# Patient Record
Sex: Female | Born: 1950 | Race: Black or African American | Hispanic: No | Smoking: Never smoker
Health system: Southern US, Community
[De-identification: ages and names within clinical notes are randomized; demographics above are authoritative.]

## PROBLEM LIST (undated history)

## (undated) DIAGNOSIS — T7840XA Allergy, unspecified, initial encounter: Secondary | ICD-10-CM

## (undated) DIAGNOSIS — M1990 Unspecified osteoarthritis, unspecified site: Secondary | ICD-10-CM

## (undated) DIAGNOSIS — C801 Malignant (primary) neoplasm, unspecified: Secondary | ICD-10-CM

## (undated) DIAGNOSIS — H269 Unspecified cataract: Secondary | ICD-10-CM

## (undated) DIAGNOSIS — M8580 Other specified disorders of bone density and structure, unspecified site: Secondary | ICD-10-CM

## (undated) DIAGNOSIS — I10 Essential (primary) hypertension: Secondary | ICD-10-CM

## (undated) DIAGNOSIS — E785 Hyperlipidemia, unspecified: Secondary | ICD-10-CM

## (undated) DIAGNOSIS — K219 Gastro-esophageal reflux disease without esophagitis: Secondary | ICD-10-CM

## (undated) DIAGNOSIS — K3184 Gastroparesis: Secondary | ICD-10-CM

## (undated) HISTORY — DX: Unspecified cataract: H26.9

## (undated) HISTORY — DX: Malignant (primary) neoplasm, unspecified: C80.1

## (undated) HISTORY — PX: ABDOMINAL HYSTERECTOMY: SHX81

## (undated) HISTORY — DX: Gastro-esophageal reflux disease without esophagitis: K21.9

## (undated) HISTORY — DX: Gastroparesis: K31.84

## (undated) HISTORY — DX: Other specified disorders of bone density and structure, unspecified site: M85.80

## (undated) HISTORY — PX: EYE SURGERY: SHX253

## (undated) HISTORY — DX: Allergy, unspecified, initial encounter: T78.40XA

## (undated) HISTORY — DX: Essential (primary) hypertension: I10

## (undated) HISTORY — PX: CATARACT EXTRACTION: SUR2

## (undated) HISTORY — DX: Hyperlipidemia, unspecified: E78.5

## (undated) HISTORY — PX: CHOLECYSTECTOMY: SHX55

## (undated) HISTORY — DX: Unspecified osteoarthritis, unspecified site: M19.90

---

## 2000-01-29 ENCOUNTER — Encounter: Admission: RE | Admit: 2000-01-29 | Discharge: 2000-01-29 | Payer: Self-pay | Admitting: Internal Medicine

## 2000-01-29 ENCOUNTER — Encounter: Payer: Self-pay | Admitting: Internal Medicine

## 2000-04-25 ENCOUNTER — Other Ambulatory Visit: Admission: RE | Admit: 2000-04-25 | Discharge: 2000-04-25 | Payer: Self-pay | Admitting: Internal Medicine

## 2000-10-24 ENCOUNTER — Encounter: Admission: RE | Admit: 2000-10-24 | Discharge: 2000-10-24 | Payer: Self-pay | Admitting: Internal Medicine

## 2000-10-24 ENCOUNTER — Encounter: Payer: Self-pay | Admitting: Internal Medicine

## 2001-12-09 ENCOUNTER — Encounter: Admission: RE | Admit: 2001-12-09 | Discharge: 2001-12-09 | Payer: Self-pay | Admitting: Internal Medicine

## 2001-12-09 ENCOUNTER — Encounter: Payer: Self-pay | Admitting: Internal Medicine

## 2003-04-28 ENCOUNTER — Encounter: Payer: Self-pay | Admitting: Internal Medicine

## 2003-04-28 ENCOUNTER — Encounter: Admission: RE | Admit: 2003-04-28 | Discharge: 2003-04-28 | Payer: Self-pay | Admitting: Internal Medicine

## 2003-07-01 ENCOUNTER — Encounter: Admission: RE | Admit: 2003-07-01 | Discharge: 2003-07-01 | Payer: Self-pay | Admitting: Internal Medicine

## 2003-07-01 ENCOUNTER — Encounter: Payer: Self-pay | Admitting: Internal Medicine

## 2003-07-11 ENCOUNTER — Encounter: Payer: Self-pay | Admitting: Internal Medicine

## 2003-07-11 ENCOUNTER — Inpatient Hospital Stay (HOSPITAL_COMMUNITY): Admission: EM | Admit: 2003-07-11 | Discharge: 2003-07-12 | Payer: Self-pay | Admitting: Emergency Medicine

## 2003-07-13 ENCOUNTER — Ambulatory Visit (HOSPITAL_COMMUNITY): Admission: RE | Admit: 2003-07-13 | Discharge: 2003-07-13 | Payer: Self-pay | Admitting: Cardiology

## 2003-07-13 ENCOUNTER — Encounter: Payer: Self-pay | Admitting: Cardiology

## 2003-07-21 ENCOUNTER — Ambulatory Visit (HOSPITAL_COMMUNITY): Admission: RE | Admit: 2003-07-21 | Discharge: 2003-07-22 | Payer: Self-pay | Admitting: General Surgery

## 2003-07-21 ENCOUNTER — Encounter (INDEPENDENT_AMBULATORY_CARE_PROVIDER_SITE_OTHER): Payer: Self-pay | Admitting: *Deleted

## 2003-07-21 ENCOUNTER — Encounter (HOSPITAL_BASED_OUTPATIENT_CLINIC_OR_DEPARTMENT_OTHER): Payer: Self-pay | Admitting: General Surgery

## 2004-06-26 ENCOUNTER — Encounter: Admission: RE | Admit: 2004-06-26 | Discharge: 2004-06-26 | Payer: Self-pay | Admitting: Internal Medicine

## 2007-06-26 ENCOUNTER — Encounter: Admission: RE | Admit: 2007-06-26 | Discharge: 2007-06-26 | Payer: Self-pay | Admitting: Family Medicine

## 2008-07-25 ENCOUNTER — Encounter: Admission: RE | Admit: 2008-07-25 | Discharge: 2008-07-25 | Payer: Self-pay | Admitting: Family Medicine

## 2009-09-04 ENCOUNTER — Ambulatory Visit (HOSPITAL_BASED_OUTPATIENT_CLINIC_OR_DEPARTMENT_OTHER): Admission: RE | Admit: 2009-09-04 | Discharge: 2009-09-05 | Payer: Self-pay | Admitting: Urology

## 2010-02-16 ENCOUNTER — Encounter: Admission: RE | Admit: 2010-02-16 | Discharge: 2010-02-16 | Payer: Self-pay | Admitting: Family Medicine

## 2010-11-11 ENCOUNTER — Encounter: Payer: Self-pay | Admitting: Family Medicine

## 2011-01-23 LAB — CREATININE, SERUM
Creatinine, Ser: 1.05 mg/dL (ref 0.4–1.2)
GFR calc Af Amer: >60 mL/min (ref >60)
GFR calc non Af Amer: 54 mL/min — ABNORMAL LOW (ref >60)

## 2011-01-23 LAB — POCT I-STAT 4, (NA,K, GLUC, HGB,HCT)
Potassium: 3.4 mEq/L — ABNORMAL LOW (ref 3.5–5.1)
Sodium: 140 mEq/L (ref 135–145)

## 2011-03-08 NOTE — Op Note (Signed)
[name], [name]                      ACCOUNT NO.:  [id number]   MEDICAL RECORD NO.:  [id number]                   PATIENT TYPE:  OIB   LOCATION:  2899                                 FACILITY:  [facility]   PHYSICIAN:  Leonie Man, M.D.                DATE OF BIRTH:  06/14/1951   DATE OF PROCEDURE:  07/21/2003  DATE OF DISCHARGE:                                 OPERATIVE REPORT   PREOPERATIVE DIAGNOSIS:  Chronic calculous cholecystitis.   POSTOPERATIVE DIAGNOSIS:  Chronic calculous cholecystitis.   PROCEDURE:  Laparoscopic cholecystectomy with intraoperative cholangiogram.   SURGEON:  Leonie Man, M.D.   ASSISTANT:  Joanne Gavel, M.D.   ANESTHESIA:  General endotracheal.   NOTE:  The patient is a 60 year old female presenting with epigastric and  right upper quadrant abdominal pain.  She had associated nausea and vomiting  on evaluation at the [facility] ER.  She had a gallbladder ultrasound which  shows a thick-walled gallbladder with stones.  There was a mild elevation in  her LFTs and she had a hyperamylasemia up to 205, which on repeat had come  down to 182.  She had no associated leukocytosis or fever.  She comes to the  operating room now for a laparoscopic cholecystectomy after the risks and  potential benefits of surgery have been fully discussed, all questions  answered, and consent obtained.   DESCRIPTION OF PROCEDURE:  Following the induction of satisfactory general  anesthesia, the patient was positioned supinely, the abdomen routinely  prepped and draped to be included in a sterile field.  Open laparoscopy was  carried down on the inferior border of the umbilicus with insertion of a  Hasson cannula and insufflation of the peritoneal cavity to 14 mmHg pressure  using carbon dioxide.  The camera was inserted and visual exploration of the  abdomen carried out.  Anterior gastric wall and duodenal sweep appeared to  be normal.  The gallbladder appeared to be  somewhat thickened but otherwise  not hydropic.  The liver edges were sharp, liver surfaces smooth.  None of  the small or large intestine visualized appeared to be normal.  The pelvic  organs were not visualized and thought to be surgically absent.  There were  some tethering adhesions to the anterior abdominal wall, but these were not  taken down.  Under direct vision epigastric and lateral ports were placed.  The gallbladder was grasped and retracted cephalad and the dissection was  carried down to the region of the ampulla with isolation of the cystic  artery and cystic duct, tracing the cystic artery up to its entry into the  gallbladder wall and tracing the cystic duct down to the common bile duct  and up to the cystic duct-gallbladder junction.  The cystic artery was  doubly clipped and transected.  The cystic duct was clipped proximally and  opened.  I inserted a cholangiocatheter into the cystic duct  through the  abdominal wall and injected one-half strength Hypaque dye into the biliary  system under fluoroscopic guidance.  The first cholangiogram showed what  appeared to be either a bubble or a stone within the common bile duct.  However, there was prompt filling of the upper radicles and the normal  tapering off the distal common bile duct with free flow of contrast into the  duodenum.  On flushing of the common bile duct, the filling defect then  disappeared.  This has been felt to be a bubble; however, on radiologic  evaluation a stone could not be ruled out.  The cholangiocatheter was then  removed.  The cystic duct was doubly clipped and then transected.  The  gallbladder was dissected from the liver bed using electrocautery and  maintaining hemostasis throughout the entire course of the dissection with  electrocautery.  After the gallbladder was removed, the liver bed was  thoroughly checked for hemostasis and noted to be dry.  The gallbladder was  then placed in a pouch.   With the camera in the epigastric port, the  gallbladder was retrieved through the umbilical port without difficulty.  The right upper quadrant was thoroughly irrigated with normal saline and  aspirated.  Sponge, instrument, and sharp counts were verified.  Trocars  removed under direct vision and pneumoperitoneum allowed to deflate and the  wounds closed in layers as follows:  The umbilical wound in two layers with  0 Vicryl and 4-0 Monocryl; epigastric and lateral port wounds were closed  with 4-0 Monocryl sutures.  All wounds were reinforced with Steri-Strips and  sterile dressings applied.  The anesthetic reversed, the patient removed  from the operating room to the recovery room in stable condition.  She  tolerated the procedure well.                                               Leonie Man, M.D.    PB/MEDQ  D:  07/21/2003  T:  07/21/2003  Job:  [id number]

## 2011-03-08 NOTE — Discharge Summary (Signed)
[name], STRENG                      ACCOUNT NO.:  [id number]   MEDICAL RECORD NO.:  [id number]                   PATIENT TYPE:  INP   LOCATION:  0346                                 FACILITY:  [facility]   PHYSICIAN:  Sherin Quarry, MD                   DATE OF BIRTH:  Sep 12, 1951   DATE OF ADMISSION:  07/11/2003  DATE OF DISCHARGE:                                 DISCHARGE SUMMARY   HISTORY OF PRESENT ILLNESS:  [name] is a very pleasant 60 year old  lady with a history of hypertension who presented on September 20 with  substernal chest pressure which seemed to radiate through to the back.  This  was associated with nausea.  The patient reported that she was somewhat  diaphoretic at time of admission.  She did not recall any previous similar  episodes.   PHYSICAL EXAMINATION:  HEENT:  Within normal limits.  CHEST:  Clear.  CARDIOVASCULAR:  Normal S1 and S2 without rubs, murmurs, or gallops.  ABDOMEN:  Benign.  There was mild epigastric tenderness.  There was no  guarding or rebound.  NEUROLOGIC:  Normal.  EXTREMITIES:  Normal.   LABORATORIES:  Amylase 205, lipase 36.  Serial cardiac enzymes were  negative.  CMET was remarkable for an AST of 73, ALT 29.  White count 8300,  hemoglobin 12.8.  LDL 113, HDL 84.  The patient had a chest x-ray which was  within normal limits.  The abdominal ultrasound showed evidence of  cholelithiasis.   HOSPITAL COURSE:  Consultation was obtained from [facility] Sharyn Lull, M.D. of  the cardiology service who felt that the patient's chest pain was somewhat  suggestive of a cardiac process.  He recommended the patient undergo a  stress Cardiolite study.  Unfortunately, this could not be scheduled on  September 21 and was scheduled for September 22.  As the patient felt well,  it was her wish that she have this done as an outpatient.  Therefore, on  September 21 the patient was discharged.   DISCHARGE DIAGNOSES:  1. Chest pain probably  secondary to cholecystitis.  2. Hypertension.   DISCHARGE MEDICATIONS:  1. The patient was advised to take Protonix 40 mg daily.  2. She was also advised to take aspirin 325 mg daily.    DISCHARGE INSTRUCTIONS:  She was advised to return for stress Cardiolite  study to be done in the morning and to be n.p.o. after midnight.  She was  further advised to make an appointment with [facility] to  further evaluate her cholelithiasis.   CONDITION ON DISCHARGE:  Good.                                               Sherin Quarry, MD    SY/MEDQ  D:  07/12/2003  T:  07/12/2003  Job:  [id number]   cc:   Olene Craven, M.D.  [address]  [address]  [place]  [place] [address]  Fax: [phone]   Eduardo Osier. Sharyn Lull, M.D.  [address]  [place]  [place] [address]  Fax: [phone]

## 2011-06-25 ENCOUNTER — Other Ambulatory Visit: Payer: Self-pay | Admitting: Family Medicine

## 2011-06-25 DIAGNOSIS — N951 Menopausal and female climacteric states: Secondary | ICD-10-CM

## 2011-06-27 ENCOUNTER — Other Ambulatory Visit: Payer: Self-pay | Admitting: Family Medicine

## 2011-06-27 DIAGNOSIS — Z1231 Encounter for screening mammogram for malignant neoplasm of breast: Secondary | ICD-10-CM

## 2011-07-29 ENCOUNTER — Ambulatory Visit
Admission: RE | Admit: 2011-07-29 | Discharge: 2011-07-29 | Disposition: A | Discharge status: Home / Self Care | Payer: Commercial Managed Care - PPO | Source: Ambulatory Visit | Attending: Family Medicine | Admitting: Family Medicine

## 2011-07-29 DIAGNOSIS — Z1231 Encounter for screening mammogram for malignant neoplasm of breast: Secondary | ICD-10-CM

## 2011-07-29 DIAGNOSIS — N951 Menopausal and female climacteric states: Secondary | ICD-10-CM

## 2011-07-30 ENCOUNTER — Ambulatory Visit: Payer: Self-pay

## 2011-07-30 ENCOUNTER — Other Ambulatory Visit: Payer: Self-pay

## 2011-08-05 ENCOUNTER — Other Ambulatory Visit: Payer: Self-pay | Admitting: Emergency Medicine

## 2011-08-05 LAB — DIFFERENTIAL
Basophils Relative: 0 % (ref 0–1)
Eosinophils Absolute: 0.2 10*3/uL (ref 0.0–0.7)
Monocytes Relative: 6 % (ref 3–12)
Neutrophils Relative %: 35 % — ABNORMAL LOW (ref 43–77)

## 2011-08-05 LAB — COMPREHENSIVE METABOLIC PANEL
CO2: 27 mEq/L (ref 19–32)
Calcium: 10.8 mg/dL — ABNORMAL HIGH (ref 8.4–10.5)
Creatinine, Ser: 1.1 mg/dL (ref 0.50–1.10)
GFR calc Af Amer: 61 mL/min — ABNORMAL LOW (ref >90)
GFR calc non Af Amer: 53 mL/min — ABNORMAL LOW (ref >90)
Glucose, Bld: 91 mg/dL (ref 70–99)

## 2011-08-05 LAB — CBC
Hemoglobin: 14 g/dL (ref 12.0–15.0)
RBC: 4.27 MIL/uL (ref 3.87–5.11)
WBC: 4.9 10*3/uL (ref 4.0–10.5)

## 2012-07-27 ENCOUNTER — Other Ambulatory Visit (HOSPITAL_BASED_OUTPATIENT_CLINIC_OR_DEPARTMENT_OTHER): Payer: Self-pay | Admitting: Family Medicine

## 2012-07-27 DIAGNOSIS — Z1231 Encounter for screening mammogram for malignant neoplasm of breast: Secondary | ICD-10-CM

## 2012-08-03 ENCOUNTER — Ambulatory Visit (HOSPITAL_BASED_OUTPATIENT_CLINIC_OR_DEPARTMENT_OTHER)
Admission: RE | Admit: 2012-08-03 | Discharge: 2012-08-03 | Disposition: A | Discharge status: Home / Self Care | Payer: Commercial Managed Care - PPO | Source: Ambulatory Visit | Attending: Family Medicine | Admitting: Family Medicine

## 2012-08-03 DIAGNOSIS — Z1231 Encounter for screening mammogram for malignant neoplasm of breast: Secondary | ICD-10-CM

## 2013-02-08 ENCOUNTER — Encounter: Payer: Self-pay | Admitting: *Deleted

## 2013-02-10 ENCOUNTER — Ambulatory Visit: Payer: Commercial Managed Care - PPO | Admitting: Internal Medicine

## 2013-02-15 ENCOUNTER — Ambulatory Visit: Payer: Commercial Managed Care - PPO | Admitting: Internal Medicine

## 2013-07-06 ENCOUNTER — Other Ambulatory Visit (HOSPITAL_BASED_OUTPATIENT_CLINIC_OR_DEPARTMENT_OTHER): Payer: Self-pay

## 2013-07-06 DIAGNOSIS — Z1231 Encounter for screening mammogram for malignant neoplasm of breast: Secondary | ICD-10-CM

## 2013-08-09 ENCOUNTER — Ambulatory Visit (HOSPITAL_BASED_OUTPATIENT_CLINIC_OR_DEPARTMENT_OTHER): Payer: Commercial Managed Care - PPO

## 2013-08-16 ENCOUNTER — Ambulatory Visit (HOSPITAL_BASED_OUTPATIENT_CLINIC_OR_DEPARTMENT_OTHER): Payer: Commercial Managed Care - PPO

## 2014-01-27 ENCOUNTER — Other Ambulatory Visit (HOSPITAL_BASED_OUTPATIENT_CLINIC_OR_DEPARTMENT_OTHER): Payer: Self-pay | Admitting: Family Medicine

## 2014-01-27 DIAGNOSIS — R319 Hematuria, unspecified: Secondary | ICD-10-CM

## 2014-01-27 DIAGNOSIS — R109 Unspecified abdominal pain: Secondary | ICD-10-CM

## 2014-01-28 ENCOUNTER — Ambulatory Visit (HOSPITAL_BASED_OUTPATIENT_CLINIC_OR_DEPARTMENT_OTHER)
Admission: RE | Admit: 2014-01-28 | Discharge: 2014-01-28 | Disposition: A | Discharge status: Home / Self Care | Payer: Commercial Managed Care - PPO | Source: Ambulatory Visit

## 2014-01-28 ENCOUNTER — Ambulatory Visit (HOSPITAL_BASED_OUTPATIENT_CLINIC_OR_DEPARTMENT_OTHER)
Admission: RE | Admit: 2014-01-28 | Discharge: 2014-01-28 | Disposition: A | Discharge status: Home / Self Care | Payer: Commercial Managed Care - PPO | Source: Ambulatory Visit | Attending: Family Medicine | Admitting: Family Medicine

## 2014-01-28 DIAGNOSIS — R319 Hematuria, unspecified: Secondary | ICD-10-CM

## 2014-01-28 DIAGNOSIS — Z1231 Encounter for screening mammogram for malignant neoplasm of breast: Secondary | ICD-10-CM

## 2014-01-28 DIAGNOSIS — R109 Unspecified abdominal pain: Secondary | ICD-10-CM

## 2015-11-23 ENCOUNTER — Ambulatory Visit
Admission: RE | Admit: 2015-11-23 | Discharge: 2015-11-23 | Disposition: A | Discharge status: Home / Self Care | Payer: Medicare Other | Source: Ambulatory Visit | Attending: Family Medicine | Admitting: Family Medicine

## 2015-11-23 ENCOUNTER — Other Ambulatory Visit: Payer: Self-pay | Admitting: Family Medicine

## 2015-11-23 DIAGNOSIS — M542 Cervicalgia: Secondary | ICD-10-CM

## 2015-11-24 ENCOUNTER — Other Ambulatory Visit: Payer: Self-pay | Admitting: Family Medicine

## 2015-11-29 ENCOUNTER — Other Ambulatory Visit: Payer: Self-pay | Admitting: Family Medicine

## 2015-11-29 DIAGNOSIS — R112 Nausea with vomiting, unspecified: Secondary | ICD-10-CM

## 2015-11-29 DIAGNOSIS — M436 Torticollis: Secondary | ICD-10-CM

## 2015-11-29 DIAGNOSIS — M542 Cervicalgia: Secondary | ICD-10-CM

## 2015-12-04 ENCOUNTER — Ambulatory Visit
Admission: RE | Admit: 2015-12-04 | Discharge: 2015-12-04 | Disposition: A | Discharge status: Home / Self Care | Payer: Medicare Other | Source: Ambulatory Visit | Attending: Family Medicine | Admitting: Family Medicine

## 2015-12-04 DIAGNOSIS — M436 Torticollis: Secondary | ICD-10-CM

## 2015-12-04 DIAGNOSIS — M542 Cervicalgia: Secondary | ICD-10-CM

## 2015-12-04 DIAGNOSIS — R112 Nausea with vomiting, unspecified: Secondary | ICD-10-CM

## 2016-05-01 ENCOUNTER — Other Ambulatory Visit (HOSPITAL_BASED_OUTPATIENT_CLINIC_OR_DEPARTMENT_OTHER): Payer: Self-pay | Admitting: Family Medicine

## 2016-05-01 DIAGNOSIS — Z1231 Encounter for screening mammogram for malignant neoplasm of breast: Secondary | ICD-10-CM

## 2016-05-07 ENCOUNTER — Ambulatory Visit (HOSPITAL_BASED_OUTPATIENT_CLINIC_OR_DEPARTMENT_OTHER)
Admission: RE | Admit: 2016-05-07 | Discharge: 2016-05-07 | Disposition: A | Discharge status: Home / Self Care | Payer: Medicare Other | Source: Ambulatory Visit | Attending: Family Medicine | Admitting: Family Medicine

## 2016-05-07 DIAGNOSIS — Z1231 Encounter for screening mammogram for malignant neoplasm of breast: Secondary | ICD-10-CM

## 2016-08-07 ENCOUNTER — Other Ambulatory Visit: Payer: Self-pay | Admitting: Family Medicine

## 2016-08-07 DIAGNOSIS — R5381 Other malaise: Secondary | ICD-10-CM

## 2016-08-08 ENCOUNTER — Other Ambulatory Visit: Payer: Self-pay | Admitting: Family Medicine

## 2016-08-08 DIAGNOSIS — M8580 Other specified disorders of bone density and structure, unspecified site: Secondary | ICD-10-CM

## 2016-08-21 ENCOUNTER — Other Ambulatory Visit (HOSPITAL_BASED_OUTPATIENT_CLINIC_OR_DEPARTMENT_OTHER): Payer: Self-pay | Admitting: Family Medicine

## 2016-08-21 ENCOUNTER — Ambulatory Visit (HOSPITAL_BASED_OUTPATIENT_CLINIC_OR_DEPARTMENT_OTHER)
Admission: RE | Admit: 2016-08-21 | Discharge: 2016-08-21 | Disposition: A | Discharge status: Home / Self Care | Payer: Medicare Other | Source: Ambulatory Visit | Attending: Family Medicine | Admitting: Family Medicine

## 2016-08-21 DIAGNOSIS — N951 Menopausal and female climacteric states: Secondary | ICD-10-CM | POA: Diagnosis present

## 2016-08-21 DIAGNOSIS — M8580 Other specified disorders of bone density and structure, unspecified site: Secondary | ICD-10-CM

## 2016-08-21 DIAGNOSIS — M8588 Other specified disorders of bone density and structure, other site: Secondary | ICD-10-CM | POA: Insufficient documentation

## 2016-08-23 ENCOUNTER — Other Ambulatory Visit: Payer: Medicare Other

## 2017-02-06 ENCOUNTER — Telehealth: Payer: Self-pay | Admitting: Cardiology

## 2017-02-06 NOTE — Telephone Encounter (Signed)
Received records from [facility] for appointment on 02/07/17 with Dr Ellyn Hack.  Records put with Dr Allison Quarry schedule for 02/07/17.lp

## 2017-02-07 ENCOUNTER — Ambulatory Visit (INDEPENDENT_AMBULATORY_CARE_PROVIDER_SITE_OTHER): Payer: Medicare Other | Admitting: Cardiology

## 2017-02-07 ENCOUNTER — Encounter: Payer: Self-pay | Admitting: Cardiology

## 2017-02-07 VITALS — BP 124/74 | HR 73 | Ht 61.0 in | Wt 148.8 lb

## 2017-02-07 DIAGNOSIS — E785 Hyperlipidemia, unspecified: Secondary | ICD-10-CM

## 2017-02-07 DIAGNOSIS — R42 Dizziness and giddiness: Secondary | ICD-10-CM

## 2017-02-07 DIAGNOSIS — I10 Essential (primary) hypertension: Secondary | ICD-10-CM

## 2017-02-07 DIAGNOSIS — R079 Chest pain, unspecified: Secondary | ICD-10-CM | POA: Diagnosis not present

## 2017-02-07 MED ORDER — NITROGLYCERIN 0.4 MG SL SUBL: 0.4000 mg | SUBLINGUAL_TABLET | SUBLINGUAL | 3 refills | Status: DC | PRN

## 2017-02-07 MED ORDER — ASPIRIN EC 81 MG PO TBEC: 81.0000 mg | DELAYED_RELEASE_TABLET | Freq: Every day | ORAL | 3 refills | Status: AC

## 2017-02-07 NOTE — Patient Instructions (Signed)
Medication Instructions:  START 81mg  Aspirin one time daily.  -NTG: Place 1 tablet under the tongue 5 mins apart up to 3 doses for chest pain.  Labwork: NONE  Testing/Procedures: Your physician has requested that you have en exercise stress myoview. For further information please visit [email or web address]. Please follow instruction sheet, as given.   Follow-Up: Your physician recommends that you schedule a follow-up appointment in: After test with Dr. Ellyn Hack.   Any Other Special Instructions Will Be Listed Below (If Applicable).     If you need a refill on your cardiac medications before your next appointment, please call your pharmacy.

## 2017-02-07 NOTE — Progress Notes (Signed)
PCP: London Pepper, MD  Clinic Note: Chief Complaint  Patient presents with  . New Patient (Initial Visit)    Chest pain, shortness of breath  . Dizziness    occasionally.    HPI: [name] is a 66 y.o. female who is being seen today for the evaluation of Shortness of breath, chest pain & dizziness at the request of  London Pepper, MD.  [name] was last seen on 02/06/2017 by Dr. Orland Mustard with a complaint of irregular heartbeat. She stated that her heart rate was in the go faster after walking a short distance she also noted some chest pain as well as shortness of breath going up stairs. She has long-standing hypertension and is now on lisinopril and HCTZ.  Recent Hospitalizations: None  Studies Reviewed:   EKG from PCPs office: Sinus rhythm, rate 68 BPM. Normal axis, intervals and durations. Essentially normal EKG.  Interval History: [name] presents today with several complaints. One is dizziness that's most notable when she first stands up. It also occurs when she starts to walk accelerated. Earlier this week she had an episode of chest pain is roughly 810. It occurred with rest and it was associated with dizziness. She also has some shortness of breath. She described the pain as a pressure sensation. She maybe felt a little palpitation with it but does most of the pressure. What she felt was that her heart rate went faster. This episode occurred while walking up to the third floor of her office building. It did go away with rest. She is also noted that her heart rate goes faster when she starts to walk and she gets a little short of breath as well as dizzy. She has long-standing vertigo symptoms this dizziness feels different. With her vertigo, her dizziness is when lying down and better with standing up. Now it's dizzy with standing.  Overallnear-syncope or syncope symptoms. No PND, orthopnea or edema. No TIA or amaurosis fugax.   No claudication.  ROS: A  comprehensive was performed. Pertinent positives in history of present illness. Otherwise Review of Systems  Constitutional: Negative for malaise/fatigue.  Gastrointestinal: Negative for blood in stool and melena.  Genitourinary: Negative for hematuria.  Musculoskeletal: Negative for joint pain.  Neurological: Positive for dizziness (See history of present illness).  Endo/Heme/Allergies: Negative for environmental allergies.  Psychiatric/Behavioral: The patient is nervous/anxious (Because of her symptoms.).   All other systems reviewed and are negative.  Past Medical History:  Diagnosis Date  . Cancer (HCC)    Basal cell carcinoma of the nose.  . Gastroparesis   . Hyperlipidemia   . Hypertension   . Osteopenia     History reviewed. No pertinent surgical history.  Current Meds  Medication Sig  . atorvastatin (LIPITOR) 10 MG tablet Take 10 mg by mouth daily.  Marland Kitchen estradiol (ESTRACE) 0.5 MG tablet TAKE 1/2 TO 1 TABLET EVERY 2-3 DAYS ORALLY  . lisinopril-hydrochlorothiazide (PRINZIDE,ZESTORETIC) 10-12.5 MG tablet 1 TABLET ONCE A DAY ORALLY 90 DAYS    Allergies  Allergen Reactions  . Celebrex [Celecoxib]   . Flagyl [Metronidazole]   . Sulfur     Social History   Social History  . Marital status: Divorced    Spouse name: N/A  . Number of children: N/A  . Years of education: N/A   Social History Main Topics  . Smoking status: Never Smoker  . Smokeless tobacco: Never Used  . Alcohol use None  . Drug use: Unknown  . Sexual activity:  Not Asked   Other Topics Concern  . None   Social History Narrative  . None    family history includes COPD in her father; Hypertension in her mother.  Wt Readings from Last 3 Encounters:  02/07/17 148 lb 12.8 oz (67.5 kg)    PHYSICAL EXAM BP 124/74   Pulse 73   Ht 5\' 1"  (1.549 m)   Wt 148 lb 12.8 oz (67.5 kg)   BMI 28.12 kg/m  General appearance: alert, cooperative, appears stated age, no distress and Well-nourished,  well-groomed. HEENT: [place]/AT, EOMI, MMM, anicteric sclera Neck: no adenopathy, no carotid bruit and no JVD Lungs: clear to auscultation bilaterally, normal percussion bilaterally and non-labored Heart: regular rate and rhythm, S1 and S2 normal, no click, rub or gallop; nondisplaced PMI; soft 1/6 SEM at RUSB. Abdomen: soft, non-tender; bowel sounds normal; no masses,  no organomegaly; no HJR Extremities: extremities normal, atraumatic, no cyanosis, and edema -none Pulses: 2+ and symmetric;  Skin: mobility and turgor normal, no evidence of bleeding or bruising, no lesions noted, temperature normal and texture normal  Neurologic: Mental status: Alert, oriented, thought content appropriate; pleasant mood and affect Cranial nerves: normal (II-XII grossly intact)    Adult ECG Report - See above. Reviewed yesterday's EKG from PCP  Other studies Reviewed: Additional studies/ records that were reviewed today include:  Recent Labs:  Most recent labs from PCP are from October 2017:  Total cholesterol 220, TG 146, HDL 53, LDL calculated 137.  Pertinent chemistries: BUN/creatinine 11.0.5. Sodium potassium 141, 4.3. Normal LFTs.  ASSESSMENT / PLAN: Problem List Items Addressed This Visit    Chest pain with moderate risk for cardiac etiology - Primary    Adjusting symptoms of exertional dyspnea and that one episode chest pain. That somewhat concerning, but she is not had any more since. Probably the increased heart rate is also somewhat concerning.  Plan: Evaluate with a Myoview stress test (preferentially treadmill)   Begin daily aspirin and   Provide cushion for when necessary nitroglycerin.      Relevant Orders   Myocardial Perfusion Imaging   Dizziness of unknown cause    This point, not really sure what the dizziness symptoms from. It sounds a little bit like it is her vertigo. It may be related to her heart rate going up, but it doesn't sound like an arrhythmia. We can adjust this further  once we've seen her back following the Myoview.      Dyslipidemia, goal to be determined (Chronic)    Currently on Lipitor low dose. Her last LDL was 137. This is not a goal for baseline care. Pending results for my view, may need to be more aggressive increased to 40 mg.      Relevant Medications   atorvastatin (LIPITOR) 10 MG tablet   Essential hypertension (Chronic)    Blood pressure looks good today. She is on lisinopril and HCTZ. Monitoring.      Relevant Medications   lisinopril-hydrochlorothiazide (PRINZIDE,ZESTORETIC) 10-12.5 MG tablet   atorvastatin (LIPITOR) 10 MG tablet   aspirin EC 81 MG tablet   nitroGLYCERIN (NITROSTAT) 0.4 MG SL tablet      Current medicines are reviewed at length with the patient today. (+/- concerns) n/a The following changes have been made: Begin daily aspirin. When necessary nitroglycerin..  Patient Instructions  Medication Instructions:  START 81mg  Aspirin one time daily.  -NTG: Place 1 tablet under the tongue 5 mins apart up to 3 doses for chest pain.  Labwork: NONE  Testing/Procedures: Your physician has requested that you have en exercise stress myoview. For further information please visit [email or web address]. Please follow instruction sheet, as given.   Follow-Up: Your physician recommends that you schedule a follow-up appointment in: After test with Dr. Ellyn Hack.   Any Other Special Instructions Will Be Listed Below (If Applicable).     If you need a refill on your cardiac medications before your next appointment, please call your pharmacy.    Studies Ordered:   Orders Placed This Encounter  Procedures  . Myocardial Perfusion Imaging      Glenetta Hew, M.D., M.S. Interventional Cardiologist   Pager # [phone] Phone # [phone] [address] [place], [place] [address]

## 2017-02-10 ENCOUNTER — Encounter: Payer: Self-pay | Admitting: Cardiology

## 2017-02-10 DIAGNOSIS — I10 Essential (primary) hypertension: Secondary | ICD-10-CM | POA: Insufficient documentation

## 2017-02-10 DIAGNOSIS — R42 Dizziness and giddiness: Secondary | ICD-10-CM | POA: Insufficient documentation

## 2017-02-10 DIAGNOSIS — E785 Hyperlipidemia, unspecified: Secondary | ICD-10-CM | POA: Insufficient documentation

## 2017-02-10 NOTE — Assessment & Plan Note (Signed)
This point, not really sure what the dizziness symptoms from. It sounds a little bit like it is her vertigo. It may be related to her heart rate going up, but it doesn't sound like an arrhythmia. We can adjust this further once we've seen her back following the Myoview.

## 2017-02-10 NOTE — Assessment & Plan Note (Signed)
Currently on Lipitor low dose. Her last LDL was 137. This is not a goal for baseline care. Pending results for my view, may need to be more aggressive increased to 40 mg.

## 2017-02-10 NOTE — Assessment & Plan Note (Signed)
Adjusting symptoms of exertional dyspnea and that one episode chest pain. That somewhat concerning, but she is not had any more since. Probably the increased heart rate is also somewhat concerning.  Plan: Evaluate with a Myoview stress test (preferentially treadmill)   Begin daily aspirin and   Provide cushion for when necessary nitroglycerin.

## 2017-02-10 NOTE — Assessment & Plan Note (Signed)
Blood pressure looks good today. She is on lisinopril and HCTZ. Monitoring.

## 2017-02-12 ENCOUNTER — Telehealth (HOSPITAL_COMMUNITY): Payer: Self-pay

## 2017-02-12 NOTE — Telephone Encounter (Signed)
Encounter complete. 

## 2017-02-14 ENCOUNTER — Ambulatory Visit (HOSPITAL_COMMUNITY)
Admission: RE | Admit: 2017-02-14 | Discharge: 2017-02-14 | Disposition: A | Discharge status: Home / Self Care | Payer: Medicare Other | Source: Ambulatory Visit | Attending: Cardiovascular Disease | Admitting: Cardiovascular Disease

## 2017-02-14 DIAGNOSIS — R079 Chest pain, unspecified: Secondary | ICD-10-CM | POA: Diagnosis not present

## 2017-02-14 DIAGNOSIS — Z8249 Family history of ischemic heart disease and other diseases of the circulatory system: Secondary | ICD-10-CM | POA: Insufficient documentation

## 2017-02-14 LAB — MYOCARDIAL PERFUSION IMAGING
CHL CUP MPHR: 154 {beats}/min
CHL CUP NUCLEAR SDS: 0
CHL CUP RESTING HR STRESS: 62 {beats}/min
CHL RATE OF PERCEIVED EXERTION: 17
CSEPED: 7 min
CSEPEDS: 11 s
CSEPHR: 97 %
Estimated workload: 8.8 METS
LV dias vol: 64 mL (ref 46–106)
LV sys vol: 24 mL
Peak HR: 150 {beats}/min
SRS: 0
SSS: 0
TID: 1.09

## 2017-02-14 MED ORDER — TECHNETIUM TC 99M TETROFOSMIN IV KIT
10.2000 | PACK | Freq: Once | INTRAVENOUS | Status: AC | PRN
  Administered 2017-02-14: 10.2 via INTRAVENOUS
  Filled 2017-02-14: qty 11

## 2017-02-14 MED ORDER — TECHNETIUM TC 99M TETROFOSMIN IV KIT
30.4000 | PACK | Freq: Once | INTRAVENOUS | Status: AC | PRN
  Administered 2017-02-14: 30.4 via INTRAVENOUS
  Filled 2017-02-14: qty 31

## 2017-02-14 NOTE — Progress Notes (Signed)
Stress Test looked good!! No sign of significant Heart Artery Disease.  Pump function is normal.  Good news!!.  HARDING,DAVID W, MD  Pls forward to Dr. London Pepper.

## 2017-02-20 ENCOUNTER — Ambulatory Visit (INDEPENDENT_AMBULATORY_CARE_PROVIDER_SITE_OTHER): Payer: Medicare Other | Admitting: Cardiology

## 2017-02-20 ENCOUNTER — Encounter: Payer: Self-pay | Admitting: Cardiology

## 2017-02-20 DIAGNOSIS — R079 Chest pain, unspecified: Secondary | ICD-10-CM

## 2017-02-20 DIAGNOSIS — E785 Hyperlipidemia, unspecified: Secondary | ICD-10-CM | POA: Diagnosis not present

## 2017-02-20 DIAGNOSIS — R42 Dizziness and giddiness: Secondary | ICD-10-CM | POA: Diagnosis not present

## 2017-02-20 DIAGNOSIS — I10 Essential (primary) hypertension: Secondary | ICD-10-CM

## 2017-02-20 NOTE — Progress Notes (Signed)
PCP: Crystal Pepper, MD  Clinic Note: Chief Complaint  Patient presents with  . Follow-up    Pt. states experiencing occassional dizziness  . Chest Pain    Evaluated with Myoview     HPI: [name] is a 66 y.o. female with a PMH below who presents today for Follow-up evaluation of chest discomfort and dizziness. Originally referred at the request of Crystal Pepper, MD.  02/06/2017: Clinic visit with Dr. Orland Flores with a complaint of irregular heartbeat. She stated that her heart rate was in the go faster after walking a short distance she also noted some chest pain as well as shortness of breath going up stairs.  [name] was last seen on 02/07/2017 - chest pain was evaluated with a Myoview noted below.  Studies Personally Reviewed - if available, images/films reviewed: From Epic Chart or Care Everywhere:  Myoview 02/14/2017: The patient walked for 7:11 of a Bruce protocol. Peak HR of 150 which is 94% predicted maximal HR . There were no ST or T wave changes to suggest ischemia. BP response to exercise was normal. The left ventricular ejection fraction is normal (55-65%). This is a low risk "normal" study  Interval History: [name] returns today actually doing fairly well. She's not having any more of the chest discomfort, and her heart rate seems to normalize. She is however noticing some dizzy spells, but not as bad as she was. She really started noticing dizziness during the day when she is out and about and stands up quickly or changes position very quickly. She has had any syncope/near syncope.  No TIA/amaurosis fugax symptoms.  She does acknowledge that she is a little deconditioned and has little bit of exertional dyspnea, but nothing new or concerning.   No PND, orthopnea or edema.   No claudication.  ROS: A comprehensive was performed. Pertinent positives and negatives noted above Review of Systems  All other systems reviewed and are negative.  I have  reviewed and (if needed) personally updated the patient's problem list, medications, allergies, past medical and surgical history, social and family history.   Past Medical History:  Diagnosis Date  . Cancer (HCC)    Basal cell carcinoma of the nose.  . Gastroparesis   . Hyperlipidemia   . Hypertension   . Osteopenia     No past surgical history on file.  Current Meds  Medication Sig  . aspirin EC 81 MG tablet Take 1 tablet (81 mg total) by mouth daily.  Marland Kitchen atorvastatin (LIPITOR) 10 MG tablet Take 10 mg by mouth daily.  Marland Kitchen estradiol (ESTRACE) 0.5 MG tablet TAKE 1/2 TO 1 TABLET EVERY 2-3 DAYS ORALLY  . lisinopril-hydrochlorothiazide (PRINZIDE,ZESTORETIC) 10-12.5 MG tablet 1 TABLET ONCE A DAY ORALLY 90 DAYS  . nitroGLYCERIN (NITROSTAT) 0.4 MG SL tablet Place 1 tablet (0.4 mg total) under the tongue every 5 (five) minutes as needed for chest pain.    Allergies  Allergen Reactions  . Celebrex [Celecoxib]   . Flagyl [Metronidazole]   . Sulfur     Social History   Social History  . Marital status: Divorced    Spouse name: N/A  . Number of children: N/A  . Years of education: N/A   Social History Main Topics  . Smoking status: Never Smoker  . Smokeless tobacco: Never Used  . Alcohol use None  . Drug use: Unknown  . Sexual activity: Not Asked   Other Topics Concern  . None   Social History Narrative  .  None    family history includes COPD in her father; Hypertension in her mother.  Wt Readings from Last 3 Encounters:  02/20/17 151 lb (68.5 kg)  02/14/17 148 lb (67.1 kg)  02/07/17 148 lb 12.8 oz (67.5 kg)    PHYSICAL EXAM BP (!) 144/78   Pulse 64   Ht 5\' 1"  (1.549 m)   Wt 151 lb (68.5 kg)   BMI 28.53 kg/m   Orthostatic vitals: BP: 139/77, sitting 145/77, initial standing 132/78, final standing 144/76 mmHg. No noticeable heart rate change General appearance: alert, cooperative, appears stated age, no distress and Borderline obese Neck: no adenopathy, no  carotid bruit and no JVD Lungs: CTA B, nonlabored, good air movement.  Heart: RRR, normal S1 and S2. No R/G. Soft 1/6 SEM at RUSB. Abdomen: soft, non-tender; bowel sounds normal; no masses,  no organomegaly; Extremities: extremities normal, atraumatic, no cyanosis, or edema Pulses: 2+ and symmetric Neurologic: Mental status: Alert, oriented, thought content appropriate  No additional findings.  Adult ECG Report n/a  Other studies Reviewed: Additional studies/ records that were reviewed today include:  Recent Labs:  No new labs available. See last clinic note for cholesterol levels   October 2017: Total cholesterol 220, TG 146, HDL 53, LDL calculated 137  ASSESSMENT / PLAN:  Symptoms have improved. Stress test with no ischemia. See her back in 3 months to determine how she's doing with dizziness. Otherwise no further cardiac evaluation at this time.  Problem List Items Addressed This Visit    Chest pain with low risk for cardiac etiology    Good news with via Myoview being negative. No signs of any ischemia or infarction. Unlikely cardiac based on the results.      Dizziness of unknown cause    Dizziness sounds more like orthostatic symptoms and vertigo. Discussed importance of adequate hydration. Will take lisinopril at dinnertime her lunchtime at she's had some heat. This will avoid the low blood pressures during the day. Ensure adequate hydration with 8-10 glasses of water daily.      Dyslipidemia, goal to be determined (Chronic)    Her goal LDL should be close to 100. I don't have the series of lab work on her, but if she is still maintaining levels above 1:30, would consider increasing statin.      Essential hypertension (Chronic)    Pretty good blood pressure readings, however, she may be having some orthostatic type symptoms with dizziness.  She didn't have any evidence of significant orthostasis on her vital signs here today, however what she is describing sounds more  orthostatic in the office. We will have her take her lisinopril at lunchtime. Ensure that she adequately hydrated.         Current medicines are reviewed at length with the patient today. (+/- concerns) None The following changes have been made: see below  Patient Instructions  KEEP HYDRATED DRINK 8 TO 10 GLASSES A WATER A DAY URINE SHOULD BE CLEAR   TAKE LISINOPRIL AT DINNER TIME  DRINK A GLASS OF WATER FIRST THING IN THE MORNING.   Your physician recommends that you schedule a follow-up appointment in [place].    Studies Ordered:   No orders of the defined types were placed in this encounter.     Glenetta Hew, M.D., M.S. Interventional Cardiologist   Pager # [phone] Phone # [phone] [address] [place], [place] [address]

## 2017-02-20 NOTE — Patient Instructions (Signed)
KEEP HYDRATED DRINK 8 TO 10 GLASSES A WATER A DAY URINE SHOULD BE CLEAR   TAKE LISINOPRIL AT DINNER TIME  DRINK A GLASS OF WATER FIRST THING IN THE MORNING.   Your physician recommends that you schedule a follow-up appointment in [place].

## 2017-02-22 ENCOUNTER — Encounter: Payer: Self-pay | Admitting: Cardiology

## 2017-02-22 NOTE — Assessment & Plan Note (Addendum)
Pretty good blood pressure readings, however, she may be having some orthostatic type symptoms with dizziness.  She didn't have any evidence of significant orthostasis on her vital signs here today, however what she is describing sounds more orthostatic in the office. We will have her take her lisinopril at lunchtime. Ensure that she adequately hydrated.

## 2017-02-22 NOTE — Assessment & Plan Note (Signed)
Good news with via Myoview being negative. No signs of any ischemia or infarction. Unlikely cardiac based on the results.

## 2017-02-22 NOTE — Assessment & Plan Note (Signed)
Her goal LDL should be close to 100. I don't have the series of lab work on her, but if she is still maintaining levels above 1:30, would consider increasing statin.

## 2017-02-22 NOTE — Assessment & Plan Note (Signed)
Dizziness sounds more like orthostatic symptoms and vertigo. Discussed importance of adequate hydration. Will take lisinopril at dinnertime her lunchtime at she's had some heat. This will avoid the low blood pressures during the day. Ensure adequate hydration with 8-10 glasses of water daily.

## 2017-05-23 ENCOUNTER — Ambulatory Visit: Payer: Medicare Other | Admitting: Cardiology

## 2017-05-29 IMAGING — MR MR CERVICAL SPINE W/O CM
4 of 5 series · 27 of 48 positions shown · non-contrast
Comparison: None.

CLINICAL DATA: Severe neck pain for 4 days. No reported radicular
complaints.

EXAM:
MRI CERVICAL SPINE WITHOUT CONTRAST
TECHNIQUE: Multiplanar, multisequence MR imaging of the cervical spine was
performed. No intravenous contrast was administered.

[Series 6: T1 · sagittal · 3.0mm · 0.66mm/px · 6 of 15 slices shown]
[im 1/15]
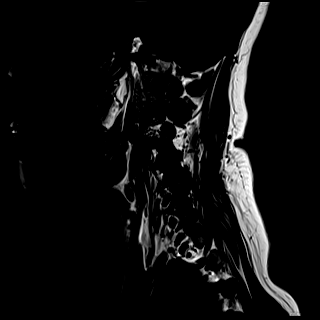
[im 3/15]
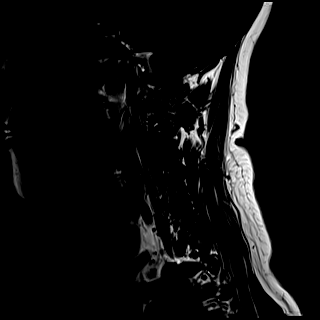
[im 6/15]
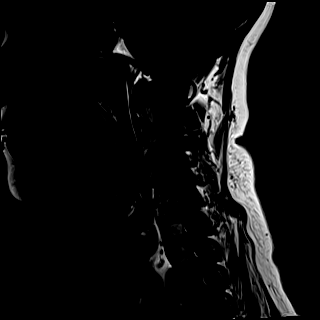
[im 9/15]
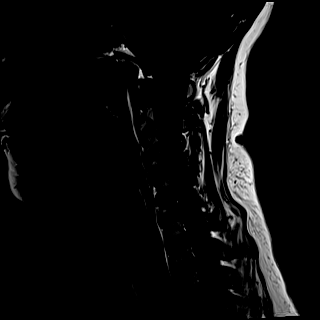
[im 12/15]
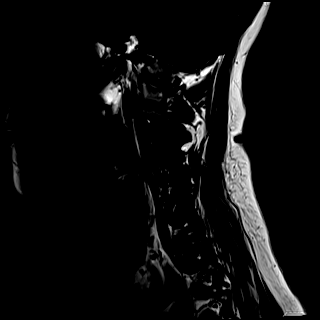
[im 15/15]
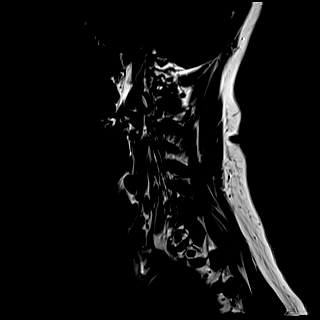

[Series 7: T2 · sagittal · 3.0mm · 0.55mm/px · 7 of 15 slices shown (1 of 2)]
[im 1/15]
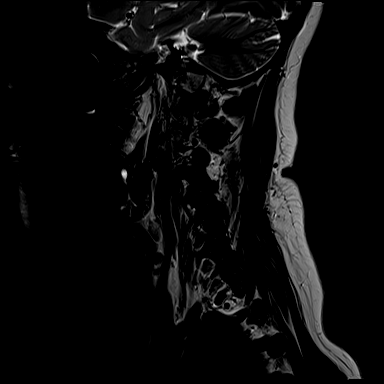
[im 3/15]
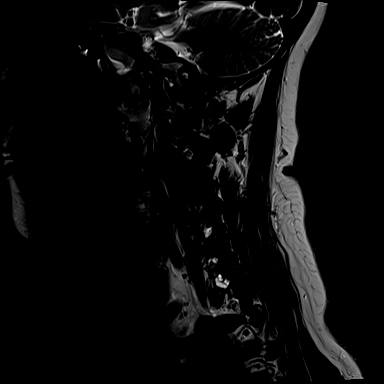
[im 5/15]
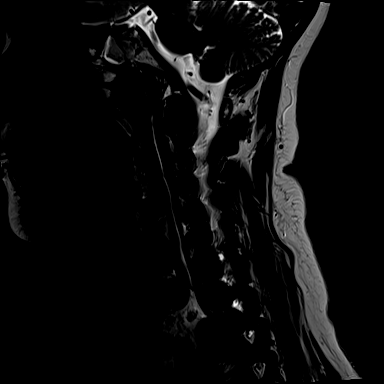
[im 8/15]
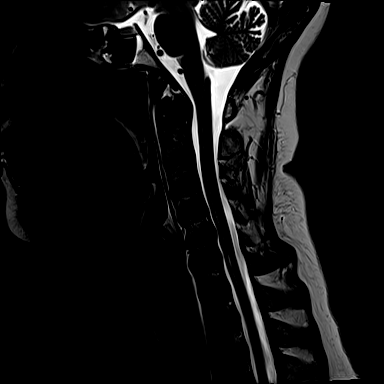
[im 10/15]
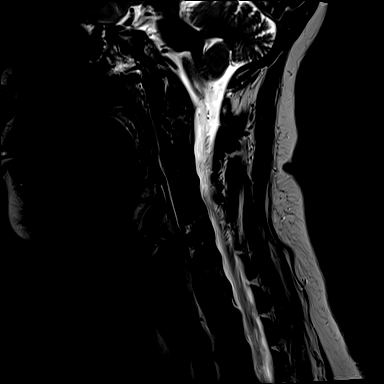
[im 12/15]
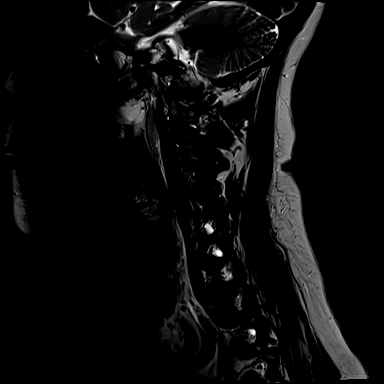
[im 15/15]
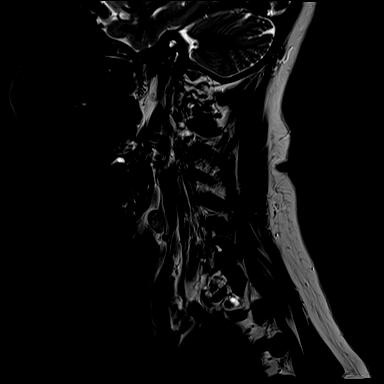

[Series 8: STIR · sagittal · 3.0mm · 0.33mm/px · 6 of 15 slices shown]
[im 1/15]
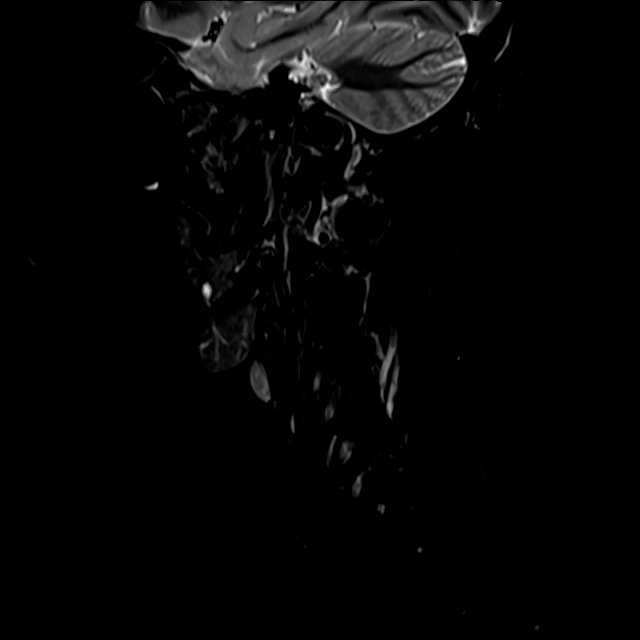
[im 3/15]
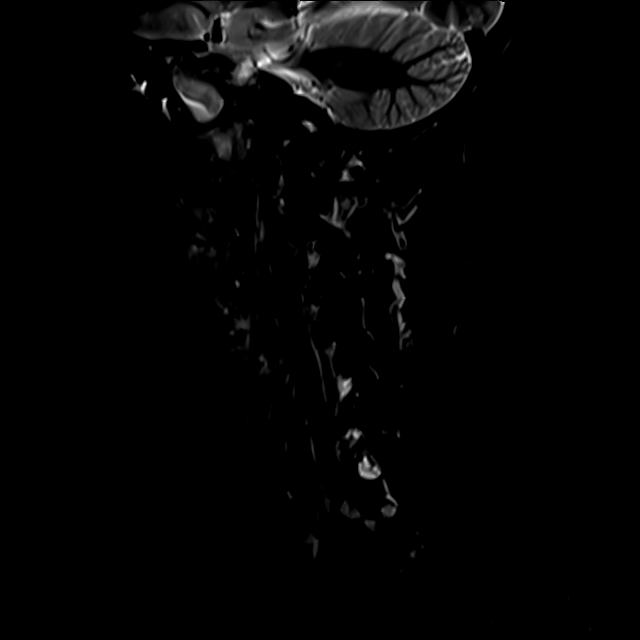
[im 5/15]
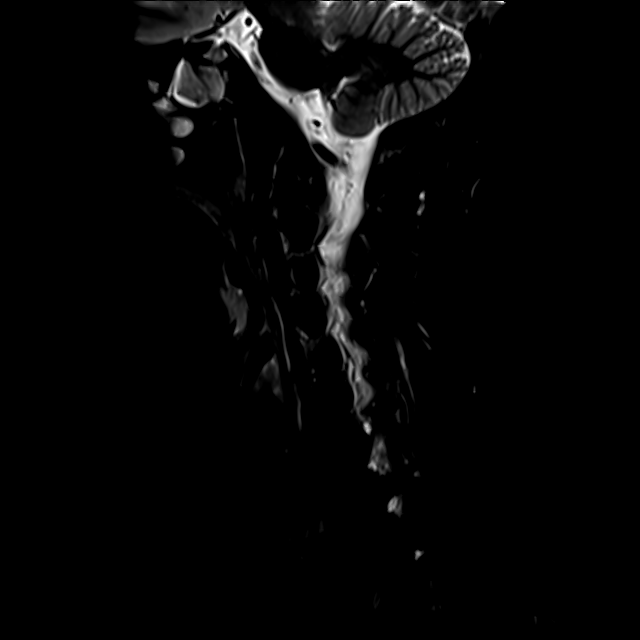
[im 8/15]
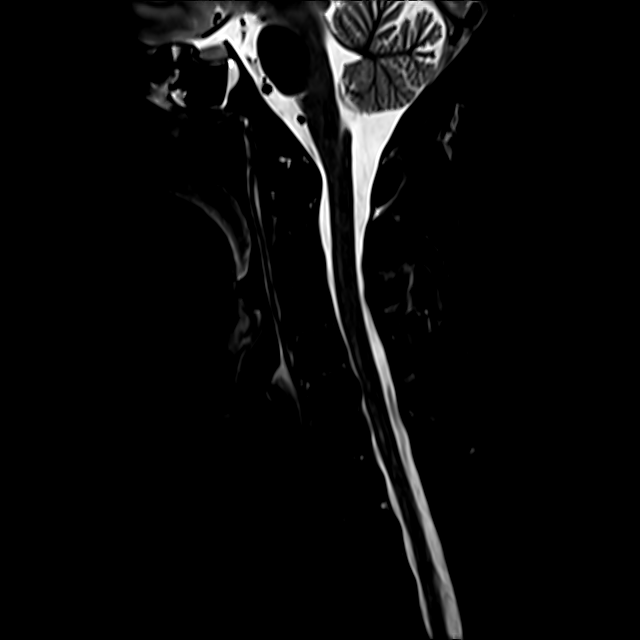
[im 10/15]
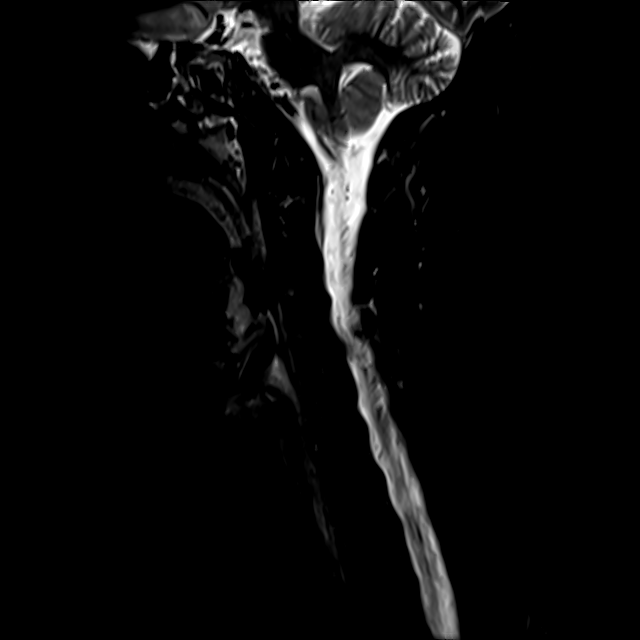
[im 12/15]
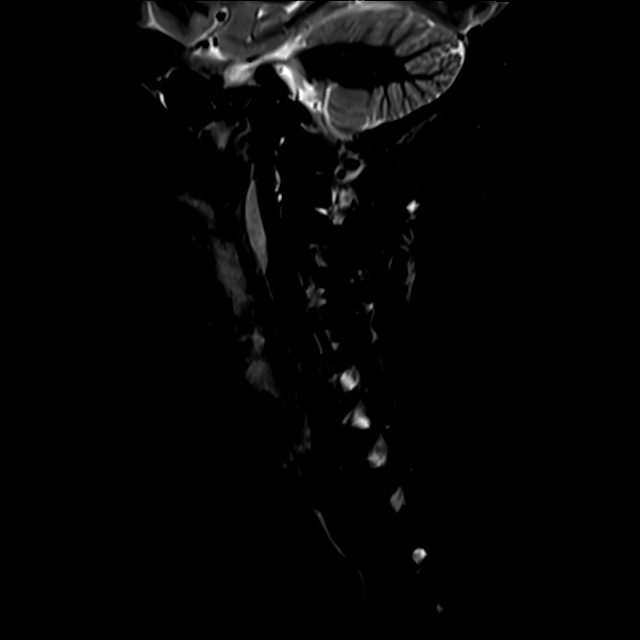

[Series 9: T2 · axial · 3.0mm · 0.50mm/px · z∈[-41,+58]mm · 8 of 32 slices shown (2 of 2)]
[im 1/32]
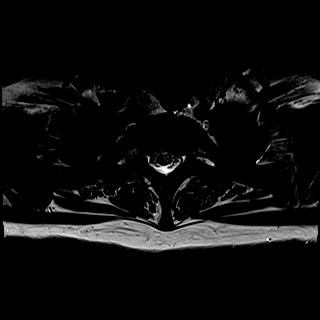
[im 5/32]
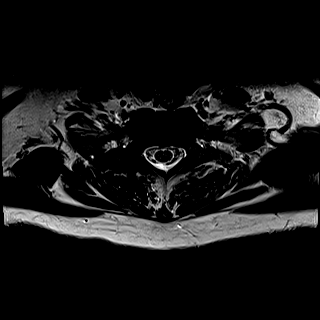
[im 10/32]
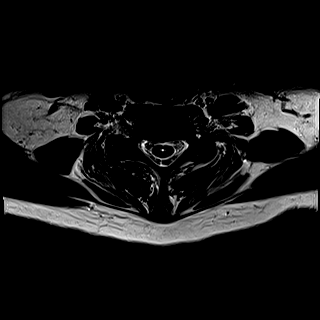
[im 15/32]
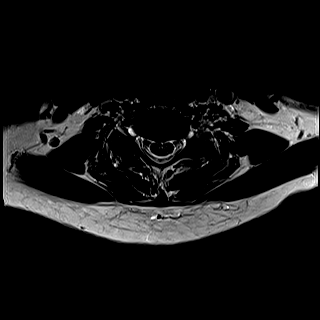
[im 17/32]
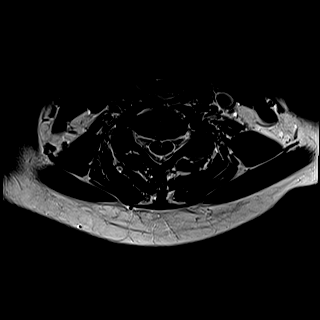
[im 22/32]
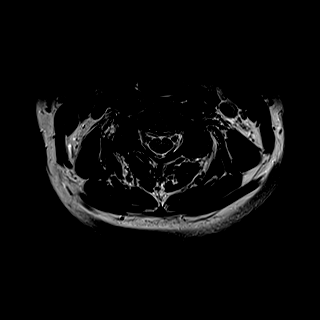
[im 27/32]
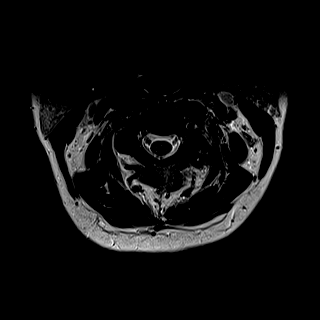
[im 32/32]
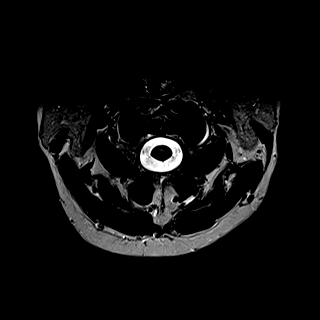

[27 of 48 positions shown; findings below may reference images not displayed]

FINDINGS: There is straightening of the normal cervical lordosis. There is no
listhesis. Severe disc space height loss is present at C5-6.
Advanced multilevel facet arthritis is noted with mild edema present
on the left at C2-3. A small effusion is noted at the left lateral
C1-2 articulation.

Vertebral body heights are preserved. Right sphenoid sinus
opacification is noted. There is a small amount of fluid in the left
sphenoid sinus. There is minimal dilatation of the central canal of
the spinal cord in the lower cervical spine, most notable at C7
without frank syrinx formation. Perineural cysts are noted in
multiple lower cervical and upper thoracic neural foramina
bilaterally.

C2-3: Severe left facet arthrosis results in minimal left neural
foraminal narrowing. No spinal stenosis.

C3-4: Mild disc bulging and severe right and mild left facet
arthrosis result in mild right greater than left neural foraminal
stenosis without spinal stenosis.

C4-5: Moderate to severe right and severe left facet arthrosis
without significant stenosis.

C5-6: Broad-based posterior disc osteophyte complex and mild right
and mild-to-moderate left facet arthrosis without stenosis.

C6-7: Mild disc bulging and moderate right and mild left facet
arthrosis result in mild right neural foraminal stenosis. No spinal
stenosis.

C7-T1: Mild right greater than left facet arthrosis without
stenosis.
IMPRESSION: 1. Severe multilevel cervical facet arthrosis. Mild facet edema on
the left at C2-3.
2. Advanced disc space height loss at C5-6 without stenosis.
3. Mild cervical disc degeneration elsewhere with mild neural
foraminal narrowing as above. No spinal stenosis.

These results will be called to the ordering clinician or
representative by the [HOSPITAL] at the imaging location.

## 2017-07-15 ENCOUNTER — Other Ambulatory Visit (HOSPITAL_BASED_OUTPATIENT_CLINIC_OR_DEPARTMENT_OTHER): Payer: Self-pay | Admitting: Family Medicine

## 2017-07-15 DIAGNOSIS — Z1231 Encounter for screening mammogram for malignant neoplasm of breast: Secondary | ICD-10-CM

## 2017-07-29 ENCOUNTER — Ambulatory Visit (HOSPITAL_BASED_OUTPATIENT_CLINIC_OR_DEPARTMENT_OTHER)
Admission: RE | Admit: 2017-07-29 | Discharge: 2017-07-29 | Disposition: A | Discharge status: Home / Self Care | Payer: Medicare Other | Source: Ambulatory Visit | Attending: Family Medicine | Admitting: Family Medicine

## 2017-07-29 DIAGNOSIS — N6320 Unspecified lump in the left breast, unspecified quadrant: Secondary | ICD-10-CM | POA: Insufficient documentation

## 2017-07-29 DIAGNOSIS — R928 Other abnormal and inconclusive findings on diagnostic imaging of breast: Secondary | ICD-10-CM | POA: Insufficient documentation

## 2017-07-29 DIAGNOSIS — Z1231 Encounter for screening mammogram for malignant neoplasm of breast: Secondary | ICD-10-CM | POA: Insufficient documentation

## 2017-07-31 ENCOUNTER — Other Ambulatory Visit: Payer: Self-pay | Admitting: Family Medicine

## 2017-07-31 DIAGNOSIS — R928 Other abnormal and inconclusive findings on diagnostic imaging of breast: Secondary | ICD-10-CM

## 2017-08-07 ENCOUNTER — Ambulatory Visit
Admission: RE | Admit: 2017-08-07 | Discharge: 2017-08-07 | Disposition: A | Discharge status: Home / Self Care | Payer: Medicare Other | Source: Ambulatory Visit | Attending: Family Medicine | Admitting: Family Medicine

## 2017-08-07 DIAGNOSIS — R928 Other abnormal and inconclusive findings on diagnostic imaging of breast: Secondary | ICD-10-CM

## 2017-09-09 ENCOUNTER — Ambulatory Visit: Payer: Medicare Other | Attending: Physician Assistant | Admitting: Physical Therapy

## 2017-09-09 ENCOUNTER — Encounter: Payer: Self-pay | Admitting: Physical Therapy

## 2017-09-09 ENCOUNTER — Other Ambulatory Visit: Payer: Self-pay

## 2017-09-09 DIAGNOSIS — M25511 Pain in right shoulder: Secondary | ICD-10-CM | POA: Diagnosis not present

## 2017-09-09 DIAGNOSIS — R293 Abnormal posture: Secondary | ICD-10-CM

## 2017-09-09 DIAGNOSIS — M25611 Stiffness of right shoulder, not elsewhere classified: Secondary | ICD-10-CM | POA: Diagnosis present

## 2017-09-09 DIAGNOSIS — M542 Cervicalgia: Secondary | ICD-10-CM | POA: Diagnosis present

## 2017-09-09 DIAGNOSIS — M6281 Muscle weakness (generalized): Secondary | ICD-10-CM

## 2017-09-10 NOTE — Therapy (Signed)
[facility] [address]  [place], [place], [address] Phone: [phone]   Fax:  [phone]  Physical Therapy Evaluation  Patient Details  Name: [name] MRN: [id number] Date of Birth: 1951/09/30 Referring Provider: Ronette Deter, PA-C   Encounter Date: 09/09/2017  PT End of Session - 09/09/17 1451    Visit Number  1    Number of Visits  12    Date for PT Re-Evaluation  10/24/17    Authorization Type  UHC Medicare & Tricare (PT only) - VL: Medicare guidelines    PT Start Time  1408    PT Stop Time  1448    PT Time Calculation (min)  40 min    Activity Tolerance  Patient tolerated treatment well    Behavior During Therapy  [facility] for tasks assessed/performed       Past Medical History:  Diagnosis Date  . Cancer (HCC)    Basal cell carcinoma of the nose.  . Gastroparesis   . Hyperlipidemia   . Hypertension   . Osteopenia     History reviewed. No pertinent surgical history.  There were no vitals filed for this visit.   Subjective Assessment - 09/09/17 1411    Subjective  Pt  reports she was lifting weights using the fly machine and forgot to use the foot pedal to release the resistance, causing her shoulder to hyperextend in ABD/ER. Now noting difficulty with lifting in abduction on R shoulder.    Diagnostic tests  Cervical MRI 11/2015: Severe multilevel cervical facet arthrosis. Mild facet edema on the left at C2-3. Advanced disc space height loss at C5-6 without stenosis. Mild cervical disc degeneration elsewhere with mild neural foraminal narrowing as above. No spinal stenosis.    Patient Stated Goals  "Work out w/o R shoulder pain"    Currently in Pain?  No/denies    Pain Score  0-No pain up to 4/10 with exertion of when lying on R side    Pain Location  Shoulder    Pain Orientation  Right    Pain Descriptors / Indicators  Pressure    Pain Type  Acute pain    Pain Radiating Towards  R upper arm;  denies numbness/tingling    Pain Onset  More than a month ago    Pain Frequency  Intermittent    Aggravating Factors   lifting into abduction, sleeping on R side    Pain Relieving Factors  Motrin & rest    Effect of Pain on Daily Activities  uncomfortable with overhead activities         [facility] PT Assessment - 09/09/17 1408      Assessment   Referring Provider  Ronette Deter, PA-C    Onset Date/Surgical Date  -- ~1 month    Hand Dominance  Right    Next MD Visit  none scheduled    Prior Therapy  none for this condition      Balance Screen   Has the patient fallen in the past 6 months  Yes    How many times?  1    Has the patient had a decrease in activity level because of a fear of falling?   No    Is the patient reluctant to leave their home because of a fear of falling?   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    Leisure  work out 5 days/wk (cardio & [employer])      Observation/Other Assessments   Focus on Therapeutic Outcomes (FOTO)   Shoulder - 68% (32% limitation); predicted 72% (28% limitation)      ROM / Strength   AROM / PROM / Strength  AROM;Strength      AROM   Overall AROM Comments  B shoulder ROM grossly WFL except mild limitation in B IR (mild pain in R shoulder with IR)    AROM Assessment Site  Cervical;Shoulder    Right/Left Shoulder  Right;Left    Cervical Flexion  38    Cervical Extension  30    Cervical - Right Side Bend  32    Cervical - Left Side Bend  24    Cervical - Right Rotation  36    Cervical - Left Rotation  32      Strength   Overall Strength Comments  pain with resisted flexion & abduction motions on R    Strength Assessment Site  Shoulder    Right/Left Shoulder  Right;Left    Right Shoulder Flexion  4-/5    Right Shoulder ABduction   4-/5    Right Shoulder Internal Rotation  4-/5    Right Shoulder External Rotation  3+/5    Left Shoulder Flexion  4+/5    Left Shoulder ABduction  4+/5    Left Shoulder Internal Rotation  4+/5    Left Shoulder External Rotation  4/5      Palpation   Palpation comment  ttp along medial border of R scapula; ttp with increased muscle tension R UT & LS             Objective measurements completed on examination: See above findings.      [facility] Adult PT Treatment/Exercise - 09/09/17 1408      Exercises   Exercises  Shoulder      Shoulder Exercises: Seated   Retraction  Both;10 reps    Retraction Limitations  + scap depression, 5" hold      Shoulder Exercises: Stretch   Corner Stretch  30 seconds;1 rep    Corner Stretch Limitations  3 way doorway stretch (upper position deferred from HEP d/t increased pain)    Other Shoulder Stretches  rhomboid stretch 2x30"             PT Education - 09/09/17 1448    Education provided  Yes    Education Details  PT eval findings, anticipated POC & initial HEP    Person(s) Educated  Patient    Methods  Explanation;Demonstration;Handout    Comprehension  Verbalized understanding;Returned demonstration;Need further instruction          PT Long Term Goals - 09/09/17 1148      PT LONG TERM GOAL #1   Title  Independent with ongoing HEP +/- gym program    Status  New    Target Date  10/24/17      PT LONG TERM GOAL #2   Title  Pt will routinely demonstrate good awareness of posture and body mechanics while working out    Status  New    Target Date  10/24/17      PT LONG TERM GOAL #3   Title  R shoulder IR ROM WFL w/o limitation due to  pain    Status  New    Target Date  10/24/17      PT LONG TERM GOAL #4   Title  R shoulder strength >/= 4/5 w/o pain on resistance    Status  New    Target Date  10/24/17      PT LONG TERM GOAL #5   Title  Cervical ROM WFL to allow adequate movement of head while driving    Status  New     Target Date  10/24/17      PT LONG TERM GOAL #6   Title  Pt will report ability to perform normal household chores and resume typical gym workout with limitation due to neck or shoulder pain    Status  New    Target Date  10/24/17             Plan - 2017/09/16 1448    Clinical Impression Statement  [name] is a 66 y/o female referred to OP PT for R shoulder and neck pain. Pt reports onset of R shoulder pain ~1 month ago while working out on the fly machine where she forgot to use the foot pedal to release the resistance and her R shoulder was hyperextended in horizontal abduction and ER, however cervical/neck pain has been more of a chronic problem for her. Pt denies pain at rest but notes pain up to 4/10 with exertion or when lying on R side. B shoulder ROM WFL other than mild restriction in R IR which is also painful. Cervical ROM limited all planes due to pain/tightness. R shoulder notably weaker than L with pain reported with resisted flexion and abduction. Only minor postural deficits noted, however significant increased muscle tension and ttp noted in R UT and LS, with ttp also noted along medial border of scapula. [name] demonstrates good potential to benefit from skilled PT with POC focusing on reinforcing postural awareness, scapular strengthening/stabilization, R shoulder ROM and strengthening, cervical ROM, with manual therapy and modalities PRN for pain and normalization of muscle tension as indicated.    History and Personal Factors relevant to plan of care:  osteopenia, HTN    Clinical Presentation  Stable    Clinical Presentation due to:  pain only intermittent w/o acute exacerbation    Clinical Decision Making  Low    Rehab Potential  Good    PT Frequency  2x / week    PT Duration  6 weeks    PT Treatment/Interventions  Patient/family education;ADLs/Self Care Home Management;Neuromuscular re-education;Therapeutic exercise;Therapeutic activities;Manual techniques;Passive range  of motion;Dry needling;Taping;Electrical Stimulation;Moist Heat;Cryotherapy;Iontophoresis 4mg /ml Dexamethasone;Traction    Consulted and Agree with Plan of Care  Patient       Patient will benefit from skilled therapeutic intervention in order to improve the following deficits and impairments:  Pain, Impaired flexibility, Decreased range of motion, Increased muscle spasms, Decreased strength, Impaired UE functional use, Postural dysfunction, Improper body mechanics, Decreased activity tolerance  Visit Diagnosis: Acute pain of right shoulder  Stiffness of right shoulder, not elsewhere classified  Muscle weakness (generalized)  Abnormal posture  Cervicalgia  G-Codes - 09/16/2017 1448    Functional Assessment Tool Used (Outpatient Only)  Shoulder FOTO = 68% (32% limitation)    Functional Limitation  Carrying, moving and handling objects    Carrying, Moving and Handling Objects Current Status (H3716)  At least 20 percent but less than 40 percent impaired, limited or restricted    Carrying, Moving and Handling Objects Goal Status (R6789)  At least 20  percent but less than 40 percent impaired, limited or restricted        Problem List Patient Active Problem List   Diagnosis Date Noted  . Dyslipidemia, goal to be determined 02/10/2017  . Essential hypertension 02/10/2017  . Dizziness of unknown cause 02/10/2017  . Chest pain with low risk for cardiac etiology 02/07/2017    Percival Spanish, PT, MPT 09/10/2017, 12:59 PM  [facility] [address]  [place], [place], [address] Phone: [phone]   Fax:  [phone]  Name: [name] MRN: [id number] Date of Birth: 07-28-1951

## 2017-09-15 ENCOUNTER — Ambulatory Visit: Payer: Medicare Other | Admitting: Physical Therapy

## 2017-09-15 ENCOUNTER — Encounter: Payer: Self-pay | Admitting: Physical Therapy

## 2017-09-15 DIAGNOSIS — M25511 Pain in right shoulder: Secondary | ICD-10-CM

## 2017-09-15 DIAGNOSIS — R293 Abnormal posture: Secondary | ICD-10-CM

## 2017-09-15 DIAGNOSIS — M25611 Stiffness of right shoulder, not elsewhere classified: Secondary | ICD-10-CM

## 2017-09-15 DIAGNOSIS — M6281 Muscle weakness (generalized): Secondary | ICD-10-CM

## 2017-09-15 NOTE — Therapy (Signed)
[facility] [address]  [place], [place], [address] Phone: [phone]   Fax:  [phone]  Physical Therapy Treatment  Patient Details  Name: [name] MRN: [id number] Date of Birth: [birth year] Referring Provider: Ronette Deter, PA-C   Encounter Date: 09/15/2017  PT End of Session - 09/15/17 0935    Visit Number  2    Number of Visits  12    Date for PT Re-Evaluation  10/24/17    Authorization Type  UHC Medicare & Tricare (PT only) - VL: Medicare guidelines    PT Start Time  0935    PT Stop Time  1018    PT Time Calculation (min)  43 min    Activity Tolerance  Patient tolerated treatment well    Behavior During Therapy  [facility] for tasks assessed/performed       Past Medical History:  Diagnosis Date  . Cancer (HCC)    Basal cell carcinoma of the nose.  . Gastroparesis   . Hyperlipidemia   . Hypertension   . Osteopenia     History reviewed. No pertinent surgical history.  There were no vitals filed for this visit.  Subjective Assessment - 09/15/17 0940    Diagnostic tests  Cervical MRI 11/2015: Severe multilevel cervical facet arthrosis. Mild facet edema on the left at C2-3. Advanced disc space height loss at C5-6 without stenosis. Mild cervical disc degeneration elsewhere with mild neural foraminal narrowing as above. No spinal stenosis.    Patient Stated Goals  "Work out w/o R shoulder pain"    Currently in Pain?  Yes    Pain Score  4     Pain Location  Shoulder    Pain Orientation  Right    Pain Descriptors / Indicators  -- "pinching"    Pain Type  Acute pain                      [facility] Adult PT Treatment/Exercise - 09/15/17 0935      Self-Care   Self-Care  Other Self-Care Comments    Other Self-Care Comments   Instructed pt in sefl-STM/massage to pecs & rhomboids using small ball on wall       Exercises   Exercises  Shoulder      Shoulder Exercises: Supine   Horizontal  ABduction  Both;10 reps;Theraband    Theraband Level (Shoulder Horizontal ABduction)  Level 1 (Yellow)    Horizontal ABduction Limitations  hooklying on pool noodle    External Rotation  Both;10 reps;Theraband    Theraband Level (Shoulder External Rotation)  Level 1 (Yellow)    External Rotation Limitations  hooklying on pool noodle    Other Supine Exercises  alt shoulder flexion + opp shoulder ext with yellow TB 10x3" each, hooklying on pool noodle      Shoulder Exercises: Seated   Retraction  Both;10 reps    Retraction Limitations  + scap depression, 5" hold      Shoulder Exercises: Standing   Extension  Both;15 reps;Theraband    Theraband Level (Shoulder Extension)  Level 2 (Red)    Extension Limitations  cues for scap retraction    Row  Both;15 reps;Theraband    Theraband Level (Shoulder Row)  Level 2 (Red)      Shoulder Exercises: ROM/Strengthening   UBE (Upper Arm Bike)  lvl 1.0 fwd/back x2' each      Shoulder Exercises: Stretch   Corner Stretch  30 seconds;2  reps    Warehouse manager Limitations  3 way doorway stretch (all positions completed as pt reports working on upper postion at home w/o pain)    Other Shoulder Stretches  rhomboid stretch 2x30"    Other Shoulder Stretches  hooklying pec stretch over pool noodle - varying arm position 2x30"      Manual Therapy   Manual Therapy  Soft tissue mobilization;Myofascial release    Soft tissue mobilization  R pecs, UT, LS & rhomboids    Myofascial Release  TPR to R lateral pecs & rhomboids             PT Education - 09/15/17 1244    Education provided  Yes    Education Details  Self-STM/massage to R pecs and rhomboids with small ball on wall    Person(s) Educated  Patient    Methods  Explanation;Demonstration    Comprehension  Verbalized understanding;Returned demonstration          PT Long Term Goals - 09/15/17 0942      PT LONG TERM GOAL #1   Title  Independent with ongoing HEP +/- gym program    Status   On-going      PT LONG TERM GOAL #2   Title  Pt will routinely demonstrate good awareness of posture and body mechanics while working out    Status  On-going      PT LONG TERM GOAL #3   Title  R shoulder IR ROM WFL w/o limitation due to pain    Status  On-going      PT LONG TERM GOAL #4   Title  R shoulder strength >/= 4/5 w/o pain on resistance    Status  On-going      PT LONG TERM GOAL #5   Title  Cervical ROM WFL to allow adequate movement of head while driving    Status  On-going      PT LONG TERM GOAL #6   Title  Pt will report ability to perform normal household chores and resume typical gym workout with limitation due to neck or shoulder pain    Status  On-going            Plan - 09/15/17 0943    Clinical Impression Statement  Pt reports R shoulder pain still intermittent w/o consistent trigger. Reports good compliance with HEP, even able to complete high position with doorway stretch which had been initially deferred from HEP. Good tolerance for exercises today but limited ability to move into overhead ranges with hooklying pec stretch over pool noodle d/t pain. Palpable increased tension/TP noted in R lateral pecs and rhomboids which responded well to STM. Instruction provided in self-STM/massage using small ball on wall for home.    Rehab Potential  Good    PT Treatment/Interventions  Patient/family education;ADLs/Self Care Home Management;Neuromuscular re-education;Therapeutic exercise;Therapeutic activities;Manual techniques;Passive range of motion;Dry needling;Taping;Electrical Stimulation;Moist Heat;Cryotherapy;Iontophoresis 4mg /ml Dexamethasone;Traction    Consulted and Agree with Plan of Care  Patient       Patient will benefit from skilled therapeutic intervention in order to improve the following deficits and impairments:  Pain, Impaired flexibility, Decreased range of motion, Increased muscle spasms, Decreased strength, Impaired UE functional use, Postural  dysfunction, Improper body mechanics, Decreased activity tolerance  Visit Diagnosis: Acute pain of right shoulder  Stiffness of right shoulder, not elsewhere classified  Muscle weakness (generalized)  Abnormal posture     Problem List Patient Active Problem List   Diagnosis Date Noted  . Dyslipidemia,  goal to be determined 02/10/2017  . Essential hypertension 02/10/2017  . Dizziness of unknown cause 02/10/2017  . Chest pain with low risk for cardiac etiology 02/07/2017    Percival Spanish, PT, MPT 09/15/2017, 12:45 PM  [facility] [address]  [place], [place], [address] Phone: [phone]   Fax:  [phone]  Name: ELENE DOWNUM MRN: [id number] Date of Birth: 1950-11-26

## 2017-09-17 ENCOUNTER — Encounter: Payer: Self-pay | Admitting: Physical Therapy

## 2017-09-17 ENCOUNTER — Ambulatory Visit: Payer: Medicare Other | Admitting: Physical Therapy

## 2017-09-17 DIAGNOSIS — M25611 Stiffness of right shoulder, not elsewhere classified: Secondary | ICD-10-CM

## 2017-09-17 DIAGNOSIS — R293 Abnormal posture: Secondary | ICD-10-CM

## 2017-09-17 DIAGNOSIS — M6281 Muscle weakness (generalized): Secondary | ICD-10-CM

## 2017-09-17 DIAGNOSIS — M25511 Pain in right shoulder: Secondary | ICD-10-CM

## 2017-09-17 DIAGNOSIS — M542 Cervicalgia: Secondary | ICD-10-CM

## 2017-09-17 NOTE — Therapy (Signed)
[facility] [address]  [place], [place], [address] Phone: [phone]   Fax:  [phone]  Physical Therapy Treatment  Patient Details  Name: [name] MRN: [id number] Date of Birth: 08-22-1951 Referring Provider: Ronette Deter, PA-C   Encounter Date: 09/17/2017  PT End of Session - 09/17/17 0845    Visit Number  3    Number of Visits  12    Date for PT Re-Evaluation  10/24/17    Authorization Type  [employer]HC Medicare & Tricare (PT only) - VL: Medicare guidelines    PT Start Time  0845    PT Stop Time  0925    PT Time Calculation (min)  40 min    Activity Tolerance  Patient tolerated treatment well    Behavior During Therapy  [facility] for tasks assessed/performed       Past Medical History:  Diagnosis Date  . Cancer (HCC)    Basal cell carcinoma of the nose.  . Gastroparesis   . Hyperlipidemia   . Hypertension   . Osteopenia     History reviewed. No pertinent surgical history.  There were no vitals filed for this visit.  Subjective Assessment - 09/17/17 0848    Subjective  Pt reports not having much pain at all at this point.    Diagnostic tests  Cervical MRI 11/2015: Severe multilevel cervical facet arthrosis. Mild facet edema on the left at C2-3. Advanced disc space height loss at C5-6 without stenosis. Mild cervical disc degeneration elsewhere with mild neural foraminal narrowing as above. No spinal stenosis.    Patient Stated Goals  "Work out w/o R shoulder pain"    Currently in Pain?  No/denies    Pain Score  0-No pain                      [facility] Adult PT Treatment/Exercise - 09/17/17 0845      Exercises   Exercises  Shoulder      Shoulder Exercises: Supine   Protraction  Both;10 reps;Weights;Strengthening    Protraction Weight (lbs)  3    Other Supine Exercises  R shoulder circles at 90 dg flexion CW/CCW 3# x10 each      Shoulder Exercises: Sidelying   External Rotation   Right;10 reps;Weights;Strengthening    External Rotation Weight (lbs)  1    External Rotation Limitations  cues for scap retraction    ABduction  Right;10 reps;Weights;Strengthening    ABduction Weight (lbs)  1    ABduction Limitations  20-90 dg only      Shoulder Exercises: Standing   Internal Rotation  Right;10 reps;Theraband;Strengthening    Theraband Level (Shoulder Internal Rotation)  Level 2 (Red)    Flexion  Both;10 reps;Weights;Strengthening    Shoulder Flexion Weight (lbs)  1    Flexion Limitations  B flexion to 90 dg, standing against pool noodle on wall to promote scapular activation    ABduction  Both;10 reps;Weights;Strengthening    Shoulder ABduction Weight (lbs)  1    ABduction Limitations  B scaption to 90 dg, standing against pool noodle on wall to promote scapular activation      Shoulder Exercises: ROM/Strengthening   UBE (Upper Arm Bike)  lvl 1.5 fwd/back x3' each    Cybex Row  15 reps    Cybex Row Limitations  15# narrow grip    Wall Pushups  10 reps    Wall Pushups Limitations  push-up plus  Shoulder Exercises: Isometric Strengthening   Theraband Level (External Rotation)  Level 2 (Red) lateral step-out x 5    External Rotation Limitations  10x5"                  PT Long Term Goals - 09/15/17 0942      PT LONG TERM GOAL #1   Title  Independent with ongoing HEP +/- gym program    Status  On-going      PT LONG TERM GOAL #2   Title  Pt will routinely demonstrate good awareness of posture and body mechanics while working out    Status  On-going      PT LONG TERM GOAL #3   Title  R shoulder IR ROM WFL w/o limitation due to pain    Status  On-going      PT LONG TERM GOAL #4   Title  R shoulder strength >/= 4/5 w/o pain on resistance    Status  On-going      PT LONG TERM GOAL #5   Title  Cervical ROM WFL to allow adequate movement of head while driving    Status  On-going      PT LONG TERM GOAL #6   Title  Pt will report ability to  perform normal household chores and resume typical gym workout with limitation due to neck or shoulder pain    Status  On-going            Plan - 09/17/17 0849    Clinical Impression Statement  [name] denies shoulder pain today, feeling like therapy has been helping thus far. Able to progress exercises today, but pt requiring close supervision and cueing to ensure proper pacing and technique. Pt noting increased fatigue following treatment, but no pain.    Rehab Potential  Good    PT Treatment/Interventions  Patient/family education;ADLs/Self Care Home Management;Neuromuscular re-education;Therapeutic exercise;Therapeutic activities;Manual techniques;Passive range of motion;Dry needling;Taping;Electrical Stimulation;Moist Heat;Cryotherapy;Iontophoresis 4mg /ml Dexamethasone;Traction    Consulted and Agree with Plan of Care  Patient       Patient will benefit from skilled therapeutic intervention in order to improve the following deficits and impairments:  Pain, Impaired flexibility, Decreased range of motion, Increased muscle spasms, Decreased strength, Impaired UE functional use, Postural dysfunction, Improper body mechanics, Decreased activity tolerance  Visit Diagnosis: Acute pain of right shoulder  Stiffness of right shoulder, not elsewhere classified  Muscle weakness (generalized)  Abnormal posture  Cervicalgia     Problem List Patient Active Problem List   Diagnosis Date Noted  . Dyslipidemia, goal to be determined 02/10/2017  . Essential hypertension 02/10/2017  . Dizziness of unknown cause 02/10/2017  . Chest pain with low risk for cardiac etiology 02/07/2017    Percival Spanish, PT, MPT 09/17/2017, 1:30 PM  [facility] [address]  [place], [place], [address] Phone: [phone]   Fax:  [phone]  Name: [name] MRN: [id number] Date of Birth: Feb 20, 1951

## 2017-09-23 ENCOUNTER — Ambulatory Visit: Payer: Medicare Other | Attending: Physician Assistant | Admitting: Physical Therapy

## 2017-09-23 ENCOUNTER — Encounter: Payer: Self-pay | Admitting: Physical Therapy

## 2017-09-23 DIAGNOSIS — R293 Abnormal posture: Secondary | ICD-10-CM | POA: Insufficient documentation

## 2017-09-23 DIAGNOSIS — M25511 Pain in right shoulder: Secondary | ICD-10-CM

## 2017-09-23 DIAGNOSIS — M6281 Muscle weakness (generalized): Secondary | ICD-10-CM | POA: Diagnosis present

## 2017-09-23 DIAGNOSIS — M25611 Stiffness of right shoulder, not elsewhere classified: Secondary | ICD-10-CM

## 2017-09-23 DIAGNOSIS — M542 Cervicalgia: Secondary | ICD-10-CM

## 2017-09-23 NOTE — Therapy (Signed)
[facility] [address]  [place], [place], [address] Phone: [phone]   Fax:  [phone]  Physical Therapy Treatment  Patient Details  Name: [name] MRN: [id number] Date of Birth: [birth year] Referring Provider: Ronette Deter, PA-C   Encounter Date: 09/23/2017  PT End of Session - 09/23/17 0925    Visit Number  4    Number of Visits  12    Date for PT Re-Evaluation  10/24/17    Authorization Type  [employer]HC Medicare & Tricare (PT only) - VL: Medicare guidelines    PT Start Time  0924    PT Stop Time  1003    PT Time Calculation (min)  39 min    Activity Tolerance  Patient tolerated treatment well    Behavior During Therapy  [facility] for tasks assessed/performed       Past Medical History:  Diagnosis Date  . Cancer (HCC)    Basal cell carcinoma of the nose.  . Gastroparesis   . Hyperlipidemia   . Hypertension   . Osteopenia     History reviewed. No pertinent surgical history.  There were no vitals filed for this visit.  Subjective Assessment - 09/23/17 0925    Subjective  Feeling well today - no pain; able to Christmas shop this weekend    Diagnostic tests  Cervical MRI 11/2015: Severe multilevel cervical facet arthrosis. Mild facet edema on the left at C2-3. Advanced disc space height loss at C5-6 without stenosis. Mild cervical disc degeneration elsewhere with mild neural foraminal narrowing as above. No spinal stenosis.    Patient Stated Goals  "Work out w/o R shoulder pain"    Currently in Pain?  No/denies    Pain Score  0-No pain                      [facility] Adult PT Treatment/Exercise - 09/23/17 0927      Shoulder Exercises: Prone   Retraction  Strengthening;Right;10 reps;Weights    Retraction Weight (lbs)  2    Retraction Limitations  prone row on edge of mat table    Extension  Strengthening;Right;10 reps;Weights    Extension Weight (lbs)  1    Extension Limitations  prone I's  on edge of mat table    Horizontal ABduction 1  Strengthening;Right;10 reps    Horizontal ABduction 1 Limitations  prone T's on edge of mat table      Shoulder Exercises: Sidelying   External Rotation  Strengthening;Right;15 reps;Weights    External Rotation Weight (lbs)  2    ABduction  Strengthening;Right;15 reps;Weights    ABduction Weight (lbs)  2    ABduction Limitations  20-90 dg only      Shoulder Exercises: Standing   Other Standing Exercises  cabinet reaches - flexion x 10 - 1#; abduction x 10 - 1#    Other Standing Exercises  bicep curl - palm up x 12 - red tband, thumb up x 12 - red tband; tricep pull down x 10 - red tband      Shoulder Exercises: ROM/Strengthening   UBE (Upper Arm Bike)  L2 x 6 min  (3 fwd/3bwd)    Cybex Row  15 reps    Cybex Row Limitations  15# narrow grip    Wall Pushups  15 reps    Wall Pushups Limitations  push-up plus    Other ROM/Strengthening Exercises  wall push up on orange pball x 10  reps      Shoulder Exercises: Psychologist, sport and exercise Limitations  low/mid/high                  PT Long Term Goals - 09/15/17 0942      PT LONG TERM GOAL #1   Title  Independent with ongoing HEP +/- gym program    Status  On-going      PT LONG TERM GOAL #2   Title  Pt will routinely demonstrate good awareness of posture and body mechanics while working out    Status  On-going      PT LONG TERM GOAL #3   Title  R shoulder IR ROM WFL w/o limitation due to pain    Status  On-going      PT LONG TERM GOAL #4   Title  R shoulder strength >/= 4/5 w/o pain on resistance    Status  On-going      PT LONG TERM GOAL #5   Title  Cervical ROM WFL to allow adequate movement of head while driving    Status  On-going      PT LONG TERM GOAL #6   Title  Pt will report ability to perform normal household chores and resume typical gym workout with limitation due to neck or shoulder pain    Status  On-going             Plan - 09/23/17 0926    Clinical Impression Statement  Patient doing very well today with all strengthening tasks in session with no reproduction in symptoms other that slight muscle soreness/fatigue, as expected. Patient doing much better with scapular control throughout session with less VC required. Will continue to progress towards goals.     PT Treatment/Interventions  Patient/family education;ADLs/Self Care Home Management;Neuromuscular re-education;Therapeutic exercise;Therapeutic activities;Manual techniques;Passive range of motion;Dry needling;Taping;Electrical Stimulation;Moist Heat;Cryotherapy;Iontophoresis 4mg /ml Dexamethasone;Traction    Consulted and Agree with Plan of Care  Patient       Patient will benefit from skilled therapeutic intervention in order to improve the following deficits and impairments:  Pain, Impaired flexibility, Decreased range of motion, Increased muscle spasms, Decreased strength, Impaired UE functional use, Postural dysfunction, Improper body mechanics, Decreased activity tolerance  Visit Diagnosis: Acute pain of right shoulder  Stiffness of right shoulder, not elsewhere classified  Muscle weakness (generalized)  Abnormal posture  Cervicalgia     Problem List Patient Active Problem List   Diagnosis Date Noted  . Dyslipidemia, goal to be determined 02/10/2017  . Essential hypertension 02/10/2017  . Dizziness of unknown cause 02/10/2017  . Chest pain with low risk for cardiac etiology 02/07/2017     Lanney Gins, PT, DPT 09/23/17 10:12 AM   [facility] [address]  [place], [place], [address] Phone: [phone]   Fax:  [phone]  Name: WANITA DERENZO MRN: [id number] Date of Birth: 21-Jan-1951

## 2017-09-25 ENCOUNTER — Ambulatory Visit: Payer: Medicare Other | Admitting: Physical Therapy

## 2017-09-29 ENCOUNTER — Ambulatory Visit: Payer: Medicare Other | Admitting: Physical Therapy

## 2017-10-01 ENCOUNTER — Ambulatory Visit: Payer: Medicare Other | Admitting: Physical Therapy

## 2017-10-06 ENCOUNTER — Ambulatory Visit: Payer: Medicare Other | Admitting: Physical Therapy

## 2017-10-06 ENCOUNTER — Encounter: Payer: Self-pay | Admitting: Physical Therapy

## 2017-10-06 DIAGNOSIS — M25511 Pain in right shoulder: Secondary | ICD-10-CM

## 2017-10-06 DIAGNOSIS — M6281 Muscle weakness (generalized): Secondary | ICD-10-CM

## 2017-10-06 DIAGNOSIS — M25611 Stiffness of right shoulder, not elsewhere classified: Secondary | ICD-10-CM

## 2017-10-06 DIAGNOSIS — R293 Abnormal posture: Secondary | ICD-10-CM

## 2017-10-06 DIAGNOSIS — M542 Cervicalgia: Secondary | ICD-10-CM

## 2017-10-06 NOTE — Therapy (Signed)
[facility] [address]  [place], [place], [address] Phone: [phone]   Fax:  [phone]  Physical Therapy Treatment  Patient Details  Name: [name] MRN: [id number] Date of Birth: 06-26-1951 Referring Provider: Ronette Deter, PA-C   Encounter Date: 10/06/2017  PT End of Session - 10/06/17 0934    Visit Number  5    Number of Visits  12    Date for PT Re-Evaluation  10/24/17    Authorization Type  UHC Medicare & Tricare (PT only) - VL: Medicare guidelines    PT Start Time  0934    PT Stop Time  1015    PT Time Calculation (min)  41 min    Activity Tolerance  Patient tolerated treatment well    Behavior During Therapy  [facility] for tasks assessed/performed       Past Medical History:  Diagnosis Date  . Cancer (HCC)    Basal cell carcinoma of the nose.  . Gastroparesis   . Hyperlipidemia   . Hypertension   . Osteopenia     History reviewed. No pertinent surgical history.  There were no vitals filed for this visit.  Subjective Assessment - 10/06/17 0936    Subjective  "I'm doing excellent. I feel like I've improved a great deal."    Diagnostic tests  Cervical MRI 11/2015: Severe multilevel cervical facet arthrosis. Mild facet edema on the left at C2-3. Advanced disc space height loss at C5-6 without stenosis. Mild cervical disc degeneration elsewhere with mild neural foraminal narrowing as above. No spinal stenosis.    Patient Stated Goals  "Work out w/o R shoulder pain"    Currently in Pain?  No/denies    Pain Score  0-No pain                      [facility] Adult PT Treatment/Exercise - 10/06/17 0934      Exercises   Exercises  Shoulder      Shoulder Exercises: Prone   Extension  Both;10 reps;Strengthening;Weights    Extension Weight (lbs)  1    Extension Limitations  I"s over green Pball    Horizontal ABduction 1  Both;10 reps;Strengthening    Horizontal ABduction 1 Limitations   T"s over green Pball    Horizontal ABduction 2  Both;10 reps;Strengthening    Horizontal ABduction 2 Limitations  Y"s over green Pball      Shoulder Exercises: Standing   External Rotation  Right;15 reps;Theraband;Strengthening    Theraband Level (Shoulder External Rotation)  Level 2 (Red)    External Rotation Limitations  neutral shoulder    Internal Rotation  Right;15 reps;Theraband;Strengthening    Theraband Level (Shoulder Internal Rotation)  Level 2 (Red)    Internal Rotation Limitations  neutral shoulder    Extension  Both;15 reps;Theraband;Strengthening    Theraband Level (Shoulder Extension)  Level 3 (Green)    Extension Limitations  cues for scap retraction    Row  Both;15 reps;Theraband;Strengthening    Theraband Level (Shoulder Row)  Level 3 (Green)    Row Limitations  cues to avoid shoulder hiking      Shoulder Exercises: ROM/Strengthening   UBE (Upper Arm Bike)  lvl 2.5 fwd/back x3' each    Cybex Press  15 reps    Cybex Press Limitations  10# narrow grip    Wall Pushups  15 reps    Wall Pushups Limitations  push-up plus    Ball on  Wall  R shoulder circles with small ball at 90 dg flexion CW/CCW x15 each    Other ROM/Strengthening Exercises  wall push up on orange pball x15             PT Education - 10/06/17 1015    Education provided  Yes    Education Details  HEP update - green TB rows    Person(s) Educated  Patient    Methods  Explanation;Demonstration;Handout    Comprehension  Verbalized understanding;Returned demonstration;Need further instruction          PT Long Term Goals - 09/15/17 0942      PT LONG TERM GOAL #1   Title  Independent with ongoing HEP +/- gym program    Status  On-going      PT LONG TERM GOAL #2   Title  Pt will routinely demonstrate good awareness of posture and body mechanics while working out    Status  On-going      PT LONG TERM GOAL #3   Title  R shoulder IR ROM WFL w/o limitation due to pain    Status  On-going       PT LONG TERM GOAL #4   Title  R shoulder strength >/= 4/5 w/o pain on resistance    Status  On-going      PT LONG TERM GOAL #5   Title  Cervical ROM WFL to allow adequate movement of head while driving    Status  On-going      PT LONG TERM GOAL #6   Title  Pt will report ability to perform normal household chores and resume typical gym workout with limitation due to neck or shoulder pain    Status  On-going            Plan - 10/06/17 1011    Clinical Impression Statement  [name] reporting significant improvement with PT, noting able to shovel the snow last week without pain. Good tolerance for exercise progression, but still requiring intermittent cues for pacing and scapular activitation. Rows added to HEP.    Rehab Potential  Good    PT Treatment/Interventions  Patient/family education;ADLs/Self Care Home Management;Neuromuscular re-education;Therapeutic exercise;Therapeutic activities;Manual techniques;Passive range of motion;Dry needling;Taping;Electrical Stimulation;Moist Heat;Cryotherapy;Iontophoresis 4mg /ml Dexamethasone;Traction    Consulted and Agree with Plan of Care  Patient       Patient will benefit from skilled therapeutic intervention in order to improve the following deficits and impairments:  Pain, Impaired flexibility, Decreased range of motion, Increased muscle spasms, Decreased strength, Impaired UE functional use, Postural dysfunction, Improper body mechanics, Decreased activity tolerance  Visit Diagnosis: Acute pain of right shoulder  Stiffness of right shoulder, not elsewhere classified  Muscle weakness (generalized)  Abnormal posture  Cervicalgia     Problem List Patient Active Problem List   Diagnosis Date Noted  . Dyslipidemia, goal to be determined 02/10/2017  . Essential hypertension 02/10/2017  . Dizziness of unknown cause 02/10/2017  . Chest pain with low risk for cardiac etiology 02/07/2017    Percival Spanish, PT, MPT 10/06/2017,  11:25 AM  [facility] [address]  [place], [place], [address] Phone: [phone]   Fax:  [phone]  Name: [name] MRN: [id number] Date of Birth: [birth year]

## 2017-10-08 ENCOUNTER — Ambulatory Visit: Payer: Medicare Other | Admitting: Physical Therapy

## 2017-10-08 ENCOUNTER — Encounter: Payer: Self-pay | Admitting: Physical Therapy

## 2017-10-08 DIAGNOSIS — M25511 Pain in right shoulder: Secondary | ICD-10-CM

## 2017-10-08 DIAGNOSIS — M25611 Stiffness of right shoulder, not elsewhere classified: Secondary | ICD-10-CM

## 2017-10-08 DIAGNOSIS — M6281 Muscle weakness (generalized): Secondary | ICD-10-CM

## 2017-10-08 DIAGNOSIS — M542 Cervicalgia: Secondary | ICD-10-CM

## 2017-10-08 DIAGNOSIS — R293 Abnormal posture: Secondary | ICD-10-CM

## 2017-10-08 NOTE — Therapy (Signed)
[facility] [address]  [place], [place], [address] Phone: [phone]   Fax:  [phone]  Physical Therapy Treatment  Patient Details  Name: [name] MRN: [id number] Date of Birth: 06-29-1951 Referring Provider: Ronette Deter, PA-C   Encounter Date: 10/08/2017  PT End of Session - 10/08/17 0928    Visit Number  6    Number of Visits  12    Date for PT Re-Evaluation  10/24/17    Authorization Type  [employer]HC Medicare & Tricare (PT only) - VL: Medicare guidelines    PT Start Time  0928    PT Stop Time  1010    PT Time Calculation (min)  42 min    Activity Tolerance  Patient tolerated treatment well    Behavior During Therapy  [facility] for tasks assessed/performed       Past Medical History:  Diagnosis Date  . Cancer (HCC)    Basal cell carcinoma of the nose.  . Gastroparesis   . Hyperlipidemia   . Hypertension   . Osteopenia     History reviewed. No pertinent surgical history.  There were no vitals filed for this visit.  Subjective Assessment - 10/08/17 0931    Subjective  Pt doing well. No complaints. Has been able to resume normal participation in "dance class" exercise at the gym where she has to walk/dance holding 3# weights overhead.    Diagnostic tests  Cervical MRI 11/2015: Severe multilevel cervical facet arthrosis. Mild facet edema on the left at C2-3. Advanced disc space height loss at C5-6 without stenosis. Mild cervical disc degeneration elsewhere with mild neural foraminal narrowing as above. No spinal stenosis.    Patient Stated Goals  "Work out w/o R shoulder pain"    Currently in Pain?  No/denies    Pain Score  0-No pain                      [facility] Adult PT Treatment/Exercise - 10/08/17 0928      Exercises   Exercises  Shoulder      Shoulder Exercises: Standing   External Rotation  Both;15 reps;Theraband    Theraband Level (Shoulder External Rotation)  Level 2 (Red)    External Rotation Limitations  shoulders at 90 dg ABD    Internal Rotation  Right;15 reps;Theraband;Strengthening    Theraband Level (Shoulder Internal Rotation)  Level 2 (Red)    Internal Rotation Limitations  shoulder at 90 dg ABD    Row  Both;10 reps;Strengthening    Row Limitations  TRX low & mid rows      Shoulder Exercises: ROM/Strengthening   UBE (Upper Arm Bike)  lvl 2.5 fwd/back x3' each    Wall Wash  R arm overhead arc 2# x15    Modified Plank  3 reps 15 sec    Modified Plank Limitations  elbows to knees    Side Plank  2 reps 15 sec    Side Plank Limitations  R elbow to knee    "W" Arms  "W" row with red TB x15    Other ROM/Strengthening Exercises  R shoulder yellow TB oscillations in upper range flexion/extension & abd/add 3x15" each      Shoulder Exercises: Body Blade   Flexion  15 seconds;3 reps    Flexion Limitations  B shoulder & R shoulder only at 90 dg flexion    ABduction  15 seconds;3 reps    ABduction Limitations  R shoulder at 90 dg ABD/scaption                  PT Long Term Goals - 09/15/17 0942      PT LONG TERM GOAL #1   Title  Independent with ongoing HEP +/- gym program    Status  On-going      PT LONG TERM GOAL #2   Title  Pt will routinely demonstrate good awareness of posture and body mechanics while working out    Status  On-going      PT LONG TERM GOAL #3   Title  R shoulder IR ROM WFL w/o limitation due to pain    Status  On-going      PT LONG TERM GOAL #4   Title  R shoulder strength >/= 4/5 w/o pain on resistance    Status  On-going      PT LONG TERM GOAL #5   Title  Cervical ROM WFL to allow adequate movement of head while driving    Status  On-going      PT LONG TERM GOAL #6   Title  Pt will report ability to perform normal household chores and resume typical gym workout with limitation due to neck or shoulder pain    Status  On-going            Plan - 10/08/17 0933    Clinical Impression Statement  Pt  continuing to note improvement with PT, reporting ability to resume "dance classes" at the gym where she performs moves holding 3# weight in each hand overhead - no pain anymore, but still notes some fatigue where she briefly has to lower her hands. Therapeutic exercises focusing on overhead strengthening and weightbearing activities with expected fatigue noted but no increased pain. Pt planning to travel out of town next week, therefore will resume PT the following week and if pain remains well controlled will work toward transition to [facility].    Rehab Potential  Good    PT Treatment/Interventions  Patient/family education;ADLs/Self Care Home Management;Neuromuscular re-education;Therapeutic exercise;Therapeutic activities;Manual techniques;Passive range of motion;Dry needling;Taping;Electrical Stimulation;Moist Heat;Cryotherapy;Iontophoresis 4mg /ml Dexamethasone;Traction    Consulted and Agree with Plan of Care  Patient       Patient will benefit from skilled therapeutic intervention in order to improve the following deficits and impairments:  Pain, Impaired flexibility, Decreased range of motion, Increased muscle spasms, Decreased strength, Impaired UE functional use, Postural dysfunction, Improper body mechanics, Decreased activity tolerance  Visit Diagnosis: Acute pain of right shoulder  Stiffness of right shoulder, not elsewhere classified  Muscle weakness (generalized)  Abnormal posture  Cervicalgia     Problem List Patient Active Problem List   Diagnosis Date Noted  . Dyslipidemia, goal to be determined 02/10/2017  . Essential hypertension 02/10/2017  . Dizziness of unknown cause 02/10/2017  . Chest pain with low risk for cardiac etiology 02/07/2017    Percival Spanish, PT, MPT 10/08/2017, 10:13 AM  [facility] [address]  [place], [place], [address] Phone: [phone]   Fax:  [phone]  Name: ENYA BUREAU MRN: [id number] Date of Birth: [birth year]

## 2017-10-23 ENCOUNTER — Encounter: Payer: Self-pay | Admitting: Physical Therapy

## 2017-10-23 ENCOUNTER — Ambulatory Visit: Payer: Medicare Other | Attending: Physician Assistant | Admitting: Physical Therapy

## 2017-10-23 DIAGNOSIS — R293 Abnormal posture: Secondary | ICD-10-CM | POA: Insufficient documentation

## 2017-10-23 DIAGNOSIS — M6281 Muscle weakness (generalized): Secondary | ICD-10-CM | POA: Insufficient documentation

## 2017-10-23 DIAGNOSIS — M25611 Stiffness of right shoulder, not elsewhere classified: Secondary | ICD-10-CM | POA: Diagnosis present

## 2017-10-23 DIAGNOSIS — M542 Cervicalgia: Secondary | ICD-10-CM

## 2017-10-23 DIAGNOSIS — M25511 Pain in right shoulder: Secondary | ICD-10-CM | POA: Diagnosis not present

## 2017-10-23 NOTE — Therapy (Addendum)
[facility] [address]  [employer] [facility] [place], [place], [address] Phone: [phone]   Fax:  [phone]  Physical Therapy Treatment  Patient Details  Name: [name] MRN: [id number] Date of Birth: 25-Sep-1951 Referring Provider: Ronette Deter, PA-C   Encounter Date: 10/23/2017  PT End of Session - 10/23/17 0845    Visit Number  7    Number of Visits  12    Date for PT Re-Evaluation  10/24/17    Authorization Type  [employer]HC Medicare & Tricare (PT only) - VL: Medicare guidelines    PT Start Time  0845    PT Stop Time  0931    PT Time Calculation (min)  46 min    Activity Tolerance  Patient tolerated treatment well    Behavior During Therapy  [facility] for tasks assessed/performed       Past Medical History:  Diagnosis Date  . Cancer (HCC)    Basal cell carcinoma of the nose.  . Gastroparesis   . Hyperlipidemia   . Hypertension   . Osteopenia     History reviewed. No pertinent surgical history.  There were no vitals filed for this visit.  Subjective Assessment - 10/23/17 0849    Subjective  Pt reporting no issues over holidays but wasn't terribly busy.    Diagnostic tests  Cervical MRI 11/2015: Severe multilevel cervical facet arthrosis. Mild facet edema on the left at C2-3. Advanced disc space height loss at C5-6 without stenosis. Mild cervical disc degeneration elsewhere with mild neural foraminal narrowing as above. No spinal stenosis.    Patient Stated Goals  "Work out w/o R shoulder pain"    Currently in Pain?  No/denies    Pain Score  0-No pain         [facility] PT Assessment - 10/23/17 0845      Assessment   Medical Diagnosis  R shoulder & neck pain    Referring Provider  Ronette Deter, PA-C    Onset Date/Surgical Date  -- ~1 month    Hand Dominance  Right      Observation/Other Assessments   Focus on Therapeutic Outcomes (FOTO)   Shoulder - 70% (30% limitation)      AROM   Overall AROM Comments  B shoulder ROM  WNL    Cervical Flexion  39    Cervical Extension  42    Cervical - Right Side Bend  34    Cervical - Left Side Bend  36    Cervical - Right Rotation  54    Cervical - Left Rotation  62      Strength   Right Shoulder Flexion  4+/5    Right Shoulder ABduction  4+/5    Right Shoulder Internal Rotation  4+/5    Right Shoulder External Rotation  4+/5    Left Shoulder Flexion  4+/5    Left Shoulder ABduction  4+/5    Left Shoulder Internal Rotation  4+/5    Left Shoulder External Rotation  4+/5                  [facility] Adult PT Treatment/Exercise - 10/23/17 0845      Exercises   Exercises  Shoulder      Shoulder Exercises: Standing   External Rotation  Right;15 reps;Theraband;Strengthening    Theraband Level (Shoulder External Rotation)  Level 3 (Green)    External Rotation Limitations  neutral shoulder    Internal Rotation  Right;15  reps;Theraband;Strengthening    Theraband Level (Shoulder Internal Rotation)  Level 3 (Green)    Internal Rotation Limitations  neutral shoulder      Shoulder Exercises: ROM/Strengthening   UBE (Upper Arm Bike)  lvl 2.5 fwd/back x3' each    Cybex Press  15 reps    Cybex Press Limitations  15# narrow grip    Cybex Row  15 reps    Cybex Row Limitations  20# narrow grip    "W" Arms  "W" row with green TB x15    Other ROM/Strengthening Exercises  BATCA pulldown 15# x15                  PT Long Term Goals - 10/23/17 9622      PT LONG TERM GOAL #1   Title  Independent with ongoing HEP +/- gym program    Status  Achieved      PT LONG TERM GOAL #2   Title  Pt will routinely demonstrate good awareness of posture and body mechanics while working out    Status  Achieved      PT LONG TERM GOAL #3   Title  R shoulder IR ROM WFL w/o limitation due to pain    Status  Achieved      PT LONG TERM GOAL #4   Title  R shoulder strength >/= 4/5 w/o pain on resistance    Status  Achieved      PT LONG TERM GOAL #5   Title  Cervical  ROM WFL to allow adequate movement of head while driving    Status  Achieved      PT LONG TERM GOAL #6   Title  Pt will report ability to perform normal household chores and resume typical gym workout with limitation due to neck or shoulder pain    Status  Achieved            Plan - 10/23/17 0851    Clinical Impression Statement  Contrina is very pleased with her progress with PT, reporting no recent pain and able to resume her "dance class" work-outs w/o limitation due to shoulder pain. Cervical ROM improved with pt now reporting no issues with checking her blind spot while driving, and B shoulder ROM now WNL. Pt feels confident with HEP and gym program at this time. All goals met at this time and pt feels comfortable with transitioning to HEP, therefore will place pt on hold for PT x 30 days in the event that issues arise with HEP or gym program.    Rehab Potential  Good    PT Treatment/Interventions  Patient/family education;ADLs/Self Care Home Management;Neuromuscular re-education;Therapeutic exercise;Therapeutic activities;Manual techniques;Passive range of motion;Dry needling;Taping;Electrical Stimulation;Moist Heat;Cryotherapy;Iontophoresis '4mg'$ /ml Dexamethasone;Traction    PT Next Visit Plan  30 day hold    Consulted and Agree with Plan of Care  Patient       Patient will benefit from skilled therapeutic intervention in order to improve the following deficits and impairments:  Pain, Impaired flexibility, Decreased range of motion, Increased muscle spasms, Decreased strength, Impaired UE functional use, Postural dysfunction, Improper body mechanics, Decreased activity tolerance  Visit Diagnosis: Acute pain of right shoulder  Stiffness of right shoulder, not elsewhere classified  Muscle weakness (generalized)  Abnormal posture  Cervicalgia     Problem List Patient Active Problem List   Diagnosis Date Noted  . Dyslipidemia, goal to be determined 02/10/2017  . Essential  hypertension 02/10/2017  . Dizziness of unknown cause 02/10/2017  .  Chest pain with low risk for cardiac etiology 02/07/2017    Percival Spanish, PT, MPT 10/23/2017, 10:06 AM  [facility] [address]  [place], [place], [address] Phone: [phone]   Fax:  [phone]  Name: HARSHITHA FRETZ MRN: [id number] Date of Birth: 09/15/1951   PHYSICAL THERAPY DISCHARGE SUMMARY  Visits from Start of Care: 7  Current functional level related to goals / functional outcomes:   Refer to above clinical impression for status as of last visit on 10/23/17. Pt was placed on hold for 30 days and has not needed to return to PT, therefore will proceed with discharge from PT for this episode.   Remaining deficits:   As above.   Education / Equipment:   HEP  Plan: Patient agrees to discharge.  Patient goals were met. Patient is being discharged due to meeting the stated rehab goals.  ?????    Percival Spanish, PT, MPT 12/01/17, 1:45 PM  [facility] [address]  [place], [place], [address] Phone: [phone]   Fax:  [phone]

## 2018-02-25 ENCOUNTER — Ambulatory Visit
Admission: RE | Admit: 2018-02-25 | Discharge: 2018-02-25 | Disposition: A | Discharge status: Home / Self Care | Payer: Medicare Other | Source: Ambulatory Visit | Attending: Family Medicine | Admitting: Family Medicine

## 2018-02-25 ENCOUNTER — Other Ambulatory Visit: Payer: Self-pay | Admitting: Family Medicine

## 2018-02-25 DIAGNOSIS — M25572 Pain in left ankle and joints of left foot: Secondary | ICD-10-CM

## 2018-06-30 ENCOUNTER — Other Ambulatory Visit (HOSPITAL_BASED_OUTPATIENT_CLINIC_OR_DEPARTMENT_OTHER): Payer: Self-pay | Admitting: Family Medicine

## 2018-06-30 DIAGNOSIS — Z1231 Encounter for screening mammogram for malignant neoplasm of breast: Secondary | ICD-10-CM

## 2018-08-11 ENCOUNTER — Ambulatory Visit (HOSPITAL_BASED_OUTPATIENT_CLINIC_OR_DEPARTMENT_OTHER)
Admission: RE | Admit: 2018-08-11 | Discharge: 2018-08-11 | Disposition: A | Discharge status: Home / Self Care | Payer: Medicare Other | Source: Ambulatory Visit | Attending: Family Medicine | Admitting: Family Medicine

## 2018-08-11 ENCOUNTER — Encounter (HOSPITAL_BASED_OUTPATIENT_CLINIC_OR_DEPARTMENT_OTHER): Payer: Self-pay

## 2018-08-11 DIAGNOSIS — Z1231 Encounter for screening mammogram for malignant neoplasm of breast: Secondary | ICD-10-CM | POA: Diagnosis not present

## 2020-04-17 ENCOUNTER — Other Ambulatory Visit: Payer: Self-pay | Admitting: Family Medicine

## 2020-04-17 DIAGNOSIS — E2839 Other primary ovarian failure: Secondary | ICD-10-CM

## 2020-04-17 DIAGNOSIS — Z1231 Encounter for screening mammogram for malignant neoplasm of breast: Secondary | ICD-10-CM

## 2020-07-12 ENCOUNTER — Ambulatory Visit
Admission: RE | Admit: 2020-07-12 | Discharge: 2020-07-12 | Disposition: A | Discharge status: Home / Self Care | Payer: Medicare Other | Source: Ambulatory Visit | Attending: Family Medicine | Admitting: Family Medicine

## 2020-07-12 ENCOUNTER — Other Ambulatory Visit: Payer: Self-pay

## 2020-07-12 DIAGNOSIS — E2839 Other primary ovarian failure: Secondary | ICD-10-CM

## 2020-07-12 DIAGNOSIS — Z1231 Encounter for screening mammogram for malignant neoplasm of breast: Secondary | ICD-10-CM

## 2021-01-03 DIAGNOSIS — M2012 Hallux valgus (acquired), left foot: Secondary | ICD-10-CM | POA: Diagnosis not present

## 2021-01-03 DIAGNOSIS — M2011 Hallux valgus (acquired), right foot: Secondary | ICD-10-CM | POA: Diagnosis not present

## 2021-01-03 DIAGNOSIS — M7752 Other enthesopathy of left foot: Secondary | ICD-10-CM | POA: Diagnosis not present

## 2021-01-03 DIAGNOSIS — M216X2 Other acquired deformities of left foot: Secondary | ICD-10-CM | POA: Diagnosis not present

## 2021-01-03 DIAGNOSIS — M7751 Other enthesopathy of right foot: Secondary | ICD-10-CM | POA: Diagnosis not present

## 2021-01-03 DIAGNOSIS — M216X1 Other acquired deformities of right foot: Secondary | ICD-10-CM | POA: Diagnosis not present

## 2021-01-03 DIAGNOSIS — M79672 Pain in left foot: Secondary | ICD-10-CM | POA: Diagnosis not present

## 2021-01-03 DIAGNOSIS — L84 Corns and callosities: Secondary | ICD-10-CM | POA: Diagnosis not present

## 2021-01-03 DIAGNOSIS — M79671 Pain in right foot: Secondary | ICD-10-CM | POA: Diagnosis not present

## 2021-02-21 DIAGNOSIS — Z961 Presence of intraocular lens: Secondary | ICD-10-CM | POA: Diagnosis not present

## 2021-02-21 DIAGNOSIS — H2511 Age-related nuclear cataract, right eye: Secondary | ICD-10-CM | POA: Diagnosis not present

## 2021-02-21 DIAGNOSIS — H524 Presbyopia: Secondary | ICD-10-CM | POA: Diagnosis not present

## 2021-02-21 DIAGNOSIS — H40003 Preglaucoma, unspecified, bilateral: Secondary | ICD-10-CM | POA: Diagnosis not present

## 2021-03-12 DIAGNOSIS — E785 Hyperlipidemia, unspecified: Secondary | ICD-10-CM | POA: Diagnosis not present

## 2021-03-12 DIAGNOSIS — R944 Abnormal results of kidney function studies: Secondary | ICD-10-CM | POA: Diagnosis not present

## 2021-03-12 DIAGNOSIS — K219 Gastro-esophageal reflux disease without esophagitis: Secondary | ICD-10-CM | POA: Diagnosis not present

## 2021-03-12 DIAGNOSIS — K3184 Gastroparesis: Secondary | ICD-10-CM | POA: Diagnosis not present

## 2021-03-12 DIAGNOSIS — I10 Essential (primary) hypertension: Secondary | ICD-10-CM | POA: Diagnosis not present

## 2021-05-14 ENCOUNTER — Ambulatory Visit: Attending: Internal Medicine

## 2021-05-14 DIAGNOSIS — Z23 Encounter for immunization: Secondary | ICD-10-CM

## 2021-05-14 NOTE — Progress Notes (Signed)
   Covid-19 Vaccination Clinic  Name:  [name]    MRN: [id number] DOB: [birth year]  05/14/2021  [name] was observed post Covid-19 immunization for 15 minutes without incident. She was provided with Vaccine Information Sheet and instruction to access the V-Safe system.   [name] was instructed to call 911 with any severe reactions post vaccine: Difficulty breathing  Swelling of face and throat  A fast heartbeat  A bad rash all over body  Dizziness and weakness   Immunizations Administered     Name Date Dose VIS Date Route   PFIZER Comrnaty(Gray TOP) Covid-19 Vaccine 05/14/2021 10:26 AM 0.3 mL 09/28/2020 Intramuscular   Manufacturer: [place]   Lot: [id number]   [place]: [phone]

## 2021-05-18 ENCOUNTER — Other Ambulatory Visit (HOSPITAL_BASED_OUTPATIENT_CLINIC_OR_DEPARTMENT_OTHER): Payer: Self-pay

## 2021-05-18 MED ORDER — COVID-19 MRNA VAC-TRIS(PFIZER) 30 MCG/0.3ML IM SUSP
INTRAMUSCULAR | 0 refills | Status: DC
  Filled 2021-05-18: qty 0.3, 1d supply, fill #0

## 2021-06-04 ENCOUNTER — Other Ambulatory Visit: Payer: Self-pay | Admitting: Family Medicine

## 2021-06-04 DIAGNOSIS — Z1231 Encounter for screening mammogram for malignant neoplasm of breast: Secondary | ICD-10-CM

## 2021-07-04 ENCOUNTER — Other Ambulatory Visit (HOSPITAL_BASED_OUTPATIENT_CLINIC_OR_DEPARTMENT_OTHER): Payer: Self-pay | Admitting: Family Medicine

## 2021-07-04 DIAGNOSIS — Z1231 Encounter for screening mammogram for malignant neoplasm of breast: Secondary | ICD-10-CM

## 2021-07-12 ENCOUNTER — Ambulatory Visit: Attending: Family Medicine

## 2021-07-12 DIAGNOSIS — Z23 Encounter for immunization: Secondary | ICD-10-CM

## 2021-07-12 NOTE — Progress Notes (Signed)
   Covid-19 Vaccination Clinic  Name:  [name]    MRN: [id number] DOB: Feb 17, 1951  07/12/2021  [name] was observed post Covid-19 immunization for 15 minutes without incident. She was provided with Vaccine Information Sheet and instruction to access the V-Safe system.   [name] was instructed to call 911 with any severe reactions post vaccine: Difficulty breathing  Swelling of face and throat  A fast heartbeat  A bad rash all over body  Dizziness and weakness

## 2021-07-18 ENCOUNTER — Other Ambulatory Visit (HOSPITAL_BASED_OUTPATIENT_CLINIC_OR_DEPARTMENT_OTHER): Payer: Self-pay

## 2021-07-18 MED ORDER — COVID-19MRNA BIVAL VACC PFIZER 30 MCG/0.3ML IM SUSP
INTRAMUSCULAR | 0 refills | Status: DC
  Filled 2021-07-18: qty 0.3, 1d supply, fill #0

## 2021-07-23 ENCOUNTER — Ambulatory Visit

## 2021-07-31 ENCOUNTER — Other Ambulatory Visit: Payer: Self-pay

## 2021-07-31 ENCOUNTER — Encounter (HOSPITAL_BASED_OUTPATIENT_CLINIC_OR_DEPARTMENT_OTHER): Payer: Self-pay

## 2021-07-31 ENCOUNTER — Ambulatory Visit (HOSPITAL_BASED_OUTPATIENT_CLINIC_OR_DEPARTMENT_OTHER)
Admission: RE | Admit: 2021-07-31 | Discharge: 2021-07-31 | Disposition: A | Discharge status: Home / Self Care | Payer: Medicare Other | Source: Ambulatory Visit | Attending: Family Medicine | Admitting: Family Medicine

## 2021-07-31 DIAGNOSIS — Z1231 Encounter for screening mammogram for malignant neoplasm of breast: Secondary | ICD-10-CM | POA: Insufficient documentation

## 2022-04-09 LAB — CBC AND DIFFERENTIAL
HCT: 41 (ref 36–46)
Hemoglobin: 14 (ref 12.0–16.0)
Neutrophils Absolute: 2586
Platelets: 252 K/uL (ref 150–400)
WBC: 6.4

## 2022-04-09 LAB — HEPATIC FUNCTION PANEL
ALT: 16 U/L (ref 7–35)
AST: 23 (ref 13–35)
Alkaline Phosphatase: 77 (ref 25–125)
Bilirubin, Total: 0.5

## 2022-04-09 LAB — BASIC METABOLIC PANEL WITH GFR
BUN: 13 (ref 4–21)
CO2: 25 — AB (ref 13–22)
Chloride: 106 (ref 99–108)
Creatinine: 1 (ref 0.5–1.1)
Glucose: 89
Potassium: 4.7 meq/L (ref 3.5–5.1)
Sodium: 144 (ref 137–147)

## 2022-04-09 LAB — LIPID PANEL
Cholesterol: 255 — AB (ref 0–200)
HDL: 50 (ref 35–70)
LDL Cholesterol: 165
Triglycerides: 234 — AB (ref 40–160)

## 2022-04-09 LAB — VITAMIN D 25 HYDROXY (VIT D DEFICIENCY, FRACTURES): Vit D, 25-Hydroxy: 29

## 2022-04-09 LAB — COMPREHENSIVE METABOLIC PANEL
Albumin: 4.6 (ref 3.5–5.0)
Calcium: 10.1 (ref 8.7–10.7)
Globulin: 2.5
eGFR: 59

## 2022-04-09 LAB — CBC: RBC: 4.69 (ref 3.87–5.11)

## 2022-06-19 ENCOUNTER — Encounter: Payer: Self-pay | Admitting: Physician Assistant

## 2022-07-03 ENCOUNTER — Other Ambulatory Visit (HOSPITAL_BASED_OUTPATIENT_CLINIC_OR_DEPARTMENT_OTHER): Payer: Self-pay | Admitting: Family Medicine

## 2022-07-03 DIAGNOSIS — Z1231 Encounter for screening mammogram for malignant neoplasm of breast: Secondary | ICD-10-CM

## 2022-07-19 NOTE — Progress Notes (Unsigned)
    07/19/2022 [name] [id number] [birth year]  Referring provider: Shanon Ace,* Primary GI doctor: {acdocs:[id number]}  ASSESSMENT AND PLAN:   Assessment: 71 y.o. female here for assessment of the following: No diagnosis found.  Plan:  No orders of the defined types were placed in this encounter.   No orders of the defined types were placed in this encounter.   History of Present Illness:  71 y.o. female  with a past medical history of hypertension, hyperlipidemia, osteopenia, basal cell carcinoma, normal Myoview stress test 2018 and others listed below, returns to clinic today for evaluation of gastroparesis.  ***  She  reports that she has never smoked. She has never used smokeless tobacco. No history on file for alcohol use and drug use. Her family history includes COPD in her father; Hypertension in her mother.   Current Medications:   Current Outpatient Medications (Endocrine & Metabolic):    estradiol (ESTRACE) 0.5 MG tablet, TAKE 1/2 TO 1 TABLET EVERY 2-3 DAYS ORALLY  Current Outpatient Medications (Cardiovascular):    atorvastatin (LIPITOR) 10 MG tablet, Take 10 mg by mouth daily.   lisinopril-hydrochlorothiazide (PRINZIDE,ZESTORETIC) 10-12.5 MG tablet, 1 TABLET ONCE A DAY ORALLY 90 DAYS   nitroGLYCERIN (NITROSTAT) 0.4 MG SL tablet, Place 1 tablet (0.4 mg total) under the tongue every 5 (five) minutes as needed for chest pain.   Current Outpatient Medications (Analgesics):    aspirin EC 81 MG tablet, Take 1 tablet (81 mg total) by mouth daily.   Current Outpatient Medications (Other):    COVID-19 mRNA bivalent vaccine, Pfizer, injection, Inject into the muscle.   COVID-19 mRNA Vac-TriS, Pfizer, SUSP injection, Inject into the muscle.  Surgical History:  She  has no past surgical history on file.  Current Medications, Allergies, Past Medical History, Past Surgical History, Family History and Social History were reviewed in [employer] record.  Physical Exam: There were no vitals taken for this visit. General:   Pleasant, well developed female in no acute distress Heart : Regular rate and rhythm; no murmurs Pulm: Clear anteriorly; no wheezing Abdomen:  {BlankSingle:19197::"Distended","Ridged","Soft"}, {BlankSingle:19197::"Flat","Obese","Non-distended"} AB, {BlankSingle:19197::"Absent","Hyperactive, tinkling","Hypoactive","Sluggish","Active"} bowel sounds. {actendernessAB:27319} tenderness {anatomy; site abdomen:5010}. {BlankMultiple:19196::"Without guarding","With guarding","Without rebound","With rebound"}, No organomegaly appreciated. Rectal: {acrectalexam:27461} Extremities:  {With/without:5700}  edema. Neurologic:  Alert and  oriented x4;  No focal deficits.  Psych:  Cooperative. Normal mood and affect.   Vladimir Crofts, PA-C 07/19/22

## 2022-07-22 ENCOUNTER — Ambulatory Visit (INDEPENDENT_AMBULATORY_CARE_PROVIDER_SITE_OTHER): Payer: Medicare Other | Admitting: Physician Assistant

## 2022-07-22 ENCOUNTER — Encounter: Payer: Self-pay | Admitting: Physician Assistant

## 2022-07-22 ENCOUNTER — Other Ambulatory Visit (INDEPENDENT_AMBULATORY_CARE_PROVIDER_SITE_OTHER): Payer: Medicare Other

## 2022-07-22 VITALS — BP 128/72 | HR 74 | Ht 61.0 in | Wt 159.5 lb

## 2022-07-22 DIAGNOSIS — R112 Nausea with vomiting, unspecified: Secondary | ICD-10-CM | POA: Diagnosis not present

## 2022-07-22 DIAGNOSIS — K5901 Slow transit constipation: Secondary | ICD-10-CM | POA: Diagnosis not present

## 2022-07-22 DIAGNOSIS — K3184 Gastroparesis: Secondary | ICD-10-CM | POA: Diagnosis not present

## 2022-07-22 DIAGNOSIS — Z1211 Encounter for screening for malignant neoplasm of colon: Secondary | ICD-10-CM | POA: Diagnosis not present

## 2022-07-22 LAB — COMPREHENSIVE METABOLIC PANEL
ALT: 18 U/L (ref 0–35)
AST: 21 U/L (ref 0–37)
Albumin: 4.6 g/dL (ref 3.5–5.2)
Alkaline Phosphatase: 69 U/L (ref 39–117)
BUN: 13 mg/dL (ref 6–23)
CO2: 31 mEq/L (ref 19–32)
Calcium: 10 mg/dL (ref 8.4–10.5)
Chloride: 103 mEq/L (ref 96–112)
Creatinine, Ser: 0.99 mg/dL (ref 0.40–1.20)
GFR: 57.14 mL/min — ABNORMAL LOW (ref >60.00)
Glucose, Bld: 92 mg/dL (ref 70–99)
Potassium: 4 mEq/L (ref 3.5–5.1)
Sodium: 141 mEq/L (ref 135–145)
Total Bilirubin: 0.6 mg/dL (ref 0.2–1.2)
Total Protein: 7.5 g/dL (ref 6.0–8.3)

## 2022-07-22 LAB — CBC WITH DIFFERENTIAL/PLATELET
Basophils Absolute: 0 10*3/uL (ref 0.0–0.1)
Basophils Relative: 0.4 % (ref 0.0–3.0)
Eosinophils Absolute: 0.3 10*3/uL (ref 0.0–0.7)
Eosinophils Relative: 4.2 % (ref 0.0–5.0)
HCT: 39.9 % (ref 36.0–46.0)
Hemoglobin: 13.4 g/dL (ref 12.0–15.0)
Lymphocytes Relative: 43 % (ref 12.0–46.0)
Lymphs Abs: 3 10*3/uL (ref 0.7–4.0)
MCHC: 33.5 g/dL (ref 30.0–36.0)
MCV: 88.3 fl (ref 78.0–100.0)
Monocytes Absolute: 0.6 10*3/uL (ref 0.1–1.0)
Monocytes Relative: 8.7 % (ref 3.0–12.0)
Neutro Abs: 3 10*3/uL (ref 1.4–7.7)
Neutrophils Relative %: 43.7 % (ref 43.0–77.0)
Platelets: 235 10*3/uL (ref 150.0–400.0)
RBC: 4.52 Mil/uL (ref 3.87–5.11)
RDW: 14.4 % (ref 11.5–15.5)
WBC: 6.9 10*3/uL (ref 4.0–10.5)

## 2022-07-22 LAB — TSH: TSH: 2.86 u[IU]/mL (ref 0.35–5.50)

## 2022-07-22 MED ORDER — NA SULFATE-K SULFATE-MG SULF 17.5-3.13-1.6 GM/177ML PO SOLN: 1.0000 | Freq: Once | ORAL | 0 refills | Status: AC

## 2022-07-22 NOTE — Patient Instructions (Addendum)
Your provider has requested that you go to the basement level for lab work before leaving today. Press "B" on the elevator. The lab is located at the first door on the left as you exit the elevator.   Linzess 145 mcg *IBS-C patients may begin to experience relief from belly pain and overall abdominal symptoms (pain, discomfort, and bloating) in about 1 week,  with symptoms typically improving over 12 weeks.  Take at least 30 minutes before the first meal of the day on an empty stomach You can have a loose stool if you eat a high-fat breakfast. Give it at least 7 days, may have more bowel movements during that time.  The diarrhea should go away and you should start having normal, complete, full bowel movements.  It may be helpful to start treatment when you can be near the comfort of your own bathroom, such as a weekend.  After you are out we can send in a prescription if you did well, there is a prescription card  Toileting tips to help with your constipation - Drink at least 64-80 ounces of water/liquid per day. - Establish a time to try to move your bowels every day.  For many people, this is after a cup of coffee or after a meal such as breakfast. - Sit all of the way back on the toilet keeping your back fairly straight and while sitting up, try to rest the tops of your forearms on your upper thighs.   - Raising your feet with a step stool/squatty potty can be helpful to improve the angle that allows your stool to pass through the rectum. - Relax the rectum feeling it bulge toward the toilet water.  If you feel your rectum raising toward your body, you are contracting rather than relaxing. - Breathe in and slowly exhale. "Belly breath" by expanding your belly towards your belly button. Keep belly expanded as you gently direct pressure down and back to the anus.  A low pitched GRRR sound can assist with increasing intra-abdominal pressure.  - Repeat 3-4 times. If unsuccessful, contract the  pelvic floor to restore normal tone and get off the toilet.  Avoid excessive straining. - To reduce excessive wiping by teaching your anus to normally contract, place hands on outer aspect of knees and resist knee movement outward.  Hold 5-10 second then place hands just inside of knees and resist inward movement of knees.  Hold 5 seconds.  Repeat a few times each way.    Gastroparesis Please do small frequent meals like 4-6 meals a day.  Eat and drink liquids at separate times.  Avoid high fiber foods, cook your vegetables, avoid high fat food.  Suggest spreading protein throughout the day (greek yogurt, glucerna, soft meat, milk, eggs) Choose soft foods that you can mash with a fork When you are more symptomatic, change to pureed foods foods and liquids.  Consider reading "Living well with Gastroparesis" by Lambert Keto Gastroparesis is a condition in which food takes longer than normal to empty from the stomach. This condition is also known as delayed gastric emptying. It is usually a long-term (chronic) condition. There is no cure, but there are treatments and things that you can do at home to help relieve symptoms. Treating the underlying condition that causes gastroparesis can also help relieve symptoms What are the causes? In many cases, the cause of this condition is not known. Possible causes include: A hormone (endocrine) disorder, such as hypothyroidism or diabetes. A nervous  system disease, such as Parkinson's disease or multiple sclerosis. Cancer, infection, or surgery that affects the stomach or vagus nerve. The vagus nerve runs from your chest, through your neck, and to the lower part of your brain. A connective tissue disorder, such as scleroderma. Certain medicines. What increases the risk? You are more likely to develop this condition if: You have certain disorders or diseases. These may include: An endocrine disorder. An eating  disorder. Amyloidosis. Scleroderma. Parkinson's disease. Multiple sclerosis. Cancer or infection of the stomach or the vagus nerve. You have had surgery on your stomach or vagus nerve. You take certain medicines. You are female. What are the signs or symptoms? Symptoms of this condition include: Feeling full after eating very little or a loss of appetite. Nausea, vomiting, or heartburn. Bloating of your abdomen. Inconsistent blood sugar (glucose) levels on blood tests. Unexplained weight loss. Acid from the stomach coming up into the esophagus (gastroesophageal reflux). Sudden tightening (spasm) of the stomach, which can be painful. Symptoms may come and go. Some people may not notice any symptoms. How is this diagnosed? This condition is diagnosed with tests, such as: Tests that check how long it takes food to move through the stomach and intestines. These tests include: Upper gastrointestinal (GI) series. For this test, you drink a liquid that shows up well on X-rays, and then X-rays are taken of your intestines. Gastric emptying scintigraphy. For this test, you eat food that contains a small amount of radioactive material, and then scans are taken. Wireless capsule GI monitoring system. For this test, you swallow a pill (capsule) that records information about how foods and fluid move through your stomach. Gastric manometry. For this test, a tube is passed down your throat and into your stomach to measure electrical and muscular activity. Endoscopy. For this test, a long, thin tube with a camera and light on the end is passed down your throat and into your stomach to check for problems in your stomach lining. Ultrasound. This test uses sound waves to create images of the inside of your body. This can help rule out gallbladder disease or pancreatitis as a cause of your symptoms. How is this treated? There is no cure for this condition, but treatment and home care may relieve symptoms.  Treatment may include: Treating the underlying cause. Managing your symptoms by making changes to your diet and exercise habits. Taking medicines to control nausea and vomiting and to stimulate stomach muscles. Getting food through a feeding tube in the hospital. This may be done in severe cases. Having surgery to insert a device called a gastric electrical stimulator into your body. This device helps improve stomach emptying and control nausea and vomiting. Follow these instructions at home: Take over-the-counter and prescription medicines only as told by your health care provider. Follow instructions from your health care provider about eating or drinking restrictions. Your health care provider may recommend that you: Eat smaller meals more often. Eat low-fat foods. Eat low-fiber forms of high-fiber foods. For example, eat cooked vegetables instead of raw vegetables. Have only liquid foods instead of solid foods. Liquid foods are easier to digest. Drink enough fluid to keep your urine pale yellow. Exercise as often as told by your health care provider. Keep all follow-up visits. This is important. Contact a health care provider if you: Notice that your symptoms do not improve with treatment. Have new symptoms. Get help right away if you: Have severe pain in your abdomen that does not improve with treatment.  Have nausea that is severe or does not go away. Vomit every time you drink fluids. Summary Gastroparesis is a long-term (chronic) condition in which food takes longer than normal to empty from the stomach. Symptoms include nausea, vomiting, heartburn, bloating of your abdomen, and loss of appetite. Eating smaller portions, low-fat foods, and low-fiber forms of high-fiber foods may help you manage your symptoms. Get help right away if you have severe pain in your abdomen. This information is not intended to replace advice given to you by your health care provider. Make sure you  discuss any questions you have with your health care provider. Document Revised: 02/14/2020 Document Reviewed: 02/14/2020 Elsevier Patient Education  2021 [employer].

## 2022-08-07 ENCOUNTER — Ambulatory Visit (HOSPITAL_BASED_OUTPATIENT_CLINIC_OR_DEPARTMENT_OTHER)
Admission: RE | Admit: 2022-08-07 | Discharge: 2022-08-07 | Disposition: A | Discharge status: Home / Self Care | Payer: Medicare Other | Source: Ambulatory Visit | Attending: Family Medicine | Admitting: Family Medicine

## 2022-08-07 ENCOUNTER — Encounter (HOSPITAL_BASED_OUTPATIENT_CLINIC_OR_DEPARTMENT_OTHER): Payer: Self-pay

## 2022-08-07 ENCOUNTER — Other Ambulatory Visit (HOSPITAL_BASED_OUTPATIENT_CLINIC_OR_DEPARTMENT_OTHER): Payer: Self-pay

## 2022-08-07 DIAGNOSIS — Z1231 Encounter for screening mammogram for malignant neoplasm of breast: Secondary | ICD-10-CM | POA: Diagnosis present

## 2022-08-07 MED ORDER — COMIRNATY 30 MCG/0.3ML IM SUSY
PREFILLED_SYRINGE | INTRAMUSCULAR | 0 refills | Status: DC
  Filled 2022-08-07: qty 0.3, 1d supply, fill #0

## 2022-08-15 ENCOUNTER — Telehealth: Payer: Self-pay

## 2022-08-15 NOTE — Telephone Encounter (Signed)
Patient seen by Littie Deeds and was scheduled for colon 10-31 but cancelled 07-29-2022 and then saw [facility] 08-07-2022 and is scheduled to have colon 08-19-2022 per [facility]  OV with [facility] 08-07-2022 sent for scanning

## 2022-08-19 LAB — HM COLONOSCOPY

## 2022-08-20 ENCOUNTER — Encounter: Payer: Medicare Other | Admitting: Internal Medicine

## 2022-11-20 ENCOUNTER — Ambulatory Visit (HOSPITAL_BASED_OUTPATIENT_CLINIC_OR_DEPARTMENT_OTHER)
Admission: RE | Admit: 2022-11-20 | Discharge: 2022-11-20 | Disposition: A | Discharge status: Home / Self Care | Payer: Medicare Other | Source: Ambulatory Visit | Attending: Family Medicine | Admitting: Family Medicine

## 2022-11-20 ENCOUNTER — Ambulatory Visit (INDEPENDENT_AMBULATORY_CARE_PROVIDER_SITE_OTHER): Payer: Medicare Other | Admitting: Family Medicine

## 2022-11-20 ENCOUNTER — Encounter: Payer: Self-pay | Admitting: Family Medicine

## 2022-11-20 VITALS — BP 138/54 | HR 72 | Temp 98.1°F | Resp 16 | Ht 61.5 in | Wt 163.4 lb

## 2022-11-20 DIAGNOSIS — M25561 Pain in right knee: Secondary | ICD-10-CM | POA: Insufficient documentation

## 2022-11-20 DIAGNOSIS — I10 Essential (primary) hypertension: Secondary | ICD-10-CM

## 2022-11-20 DIAGNOSIS — E785 Hyperlipidemia, unspecified: Secondary | ICD-10-CM | POA: Diagnosis not present

## 2022-11-20 DIAGNOSIS — Z7689 Persons encountering health services in other specified circumstances: Secondary | ICD-10-CM | POA: Diagnosis not present

## 2022-11-20 NOTE — Assessment & Plan Note (Signed)
Blood pressure is at goal for age and co-morbidities.   Recommendations: lisinopril-hctz 12-12.5 mg daily - BP goal <130/80 - monitor and log blood pressures at home - check around the same time each day in a relaxed setting - Limit salt to <2000 mg/day - Follow DASH eating plan (heart healthy diet) - limit alcohol to 2 standard drinks per day for men and 1 per day for women - avoid tobacco products - get at least 2 hours of regular aerobic exercise weekly Patient aware of signs/symptoms requiring further/urgent evaluation. Labs deferred - she prefers to schedule physical in the near future

## 2022-11-20 NOTE — Assessment & Plan Note (Signed)
-  Medication management: continue Zetia 10 mg daily -Diet low in saturated fat -Regular exercise - at least 30 minutes, 5 times per week

## 2022-11-20 NOTE — Patient Instructions (Signed)
Xrays and referral to sports medicine for your knee pain.  In the meantime, occasion ice, NSAIDs, stretching. Consider physical therapy.     Thank you for choosing [facility] at [facility] for your Primary Care needs. I am excited for the opportunity to partner with you to meet your health care goals. It was a pleasure meeting you today!  Information on diet, exercise, and health maintenance recommendations are listed below. This is information to help you be sure you are on track for optimal health and monitoring.   Please look over this and let us know if you have any questions or if you have completed any of the health maintenance outside of [facility] so that we can be sure your records are up to date.  ___________________________________________________________  MyChart:  For all urgent or time sensitive needs we ask that you please call the office to avoid delays. Our number is [phone]. MyChart is not constantly monitored and due to the large volume of messages a day, replies may take up to 72 business hours.  MyChart Policy: MyChart allows for you to see your visit notes, after visit summary, provider recommendations, lab and tests results, make an appointment, request refills, and contact your provider or the office for non-urgent questions or concerns. Providers are seeing patients during normal business hours and do not have built in time to review MyChart messages.  We ask that you allow a minimum of 3 business days for responses to [employer]. For this reason, please do not send urgent requests through [facility]. Please call the office at [phone]. New and ongoing conditions may require a visit. We have virtual and in-person visits available for your convenience.  Complex MyChart concerns may require a visit. Your provider may request you schedule a virtual or in-person visit to ensure we are providing the best care possible. MyChart messages sent  after 11:00 AM on Friday will not be received by the provider until Monday morning.    Lab and Test Results: You will receive your lab and test results on MyChart as soon as they are completed and results have been sent by the lab or testing facility. Due to this service, you will receive your results BEFORE your provider.  I review lab and test results each morning prior to seeing patients. Some results require collaboration with other providers to ensure you are receiving the most appropriate care. For this reason, we ask that you please allow a minimum of 3-5 business days from the time that ALL results have been received for your provider to receive and review lab and test results and contact you about these.  Most lab and test result comments from the provider will be sent through [facility]. Your provider may recommend changes to the plan of care, follow-up visits, repeat testing, ask questions, or request an office visit to discuss these results. You may reply directly to this message or call the office to provide information for the provider or set up an appointment. In some instances, you will be called with test results and recommendations. Please let us know if this is preferred and we will make note of this in your chart to provide this for you.    If you have not heard a response to your lab or test results in 5 business days from all results returning to [facility], please call the office to let us know. We ask that you please avoid calling prior to this time unless there  is an emergent concern. Due to high call volumes, this can delay the resulting process.  After Hours: For all non-emergency after hours needs, please call the office at [phone] and select the option to reach the on-call  service. On-call services are shared between multiple [facility] offices and therefore it will not be possible to speak directly with your provider. On-call providers may provide medical advice and  recommendations, but are unable to provide refills for maintenance medications.  For all emergency or urgent medical needs after normal business hours, we recommend that you seek care at the closest Urgent Care or Emergency Department to ensure appropriate treatment in a timely manner.  [facility] has a 24 hour emergency room located on the ground floor for your convenience.   Urgent Concerns During the Business Day Providers are seeing patients from 8AM to [place] with a busy schedule and are most often not able to respond to non-urgent calls until the end of the day or the next business day. If you should have URGENT concerns during the day, please call and speak to the nurse or schedule a same day appointment so that we can address your concern without delay.   Thank you, again, for choosing me as your health care partner. I appreciate your trust and look forward to learning more about you!   Purcell Nails Olevia Bowens, DNP, FNP-C  ___________________________________________________________  Health Maintenance Recommendations Screening Testing Mammogram Every 1-2 years based on history and risk factors Starting at age 59 Pap Smear Ages 21-39 every 3 years Ages 69-65 every 5 years with HPV testing More frequent testing may be required based on results and history Colon Cancer Screening Every 1-10 years based on test performed, risk factors, and history Starting at age 34 Bone Density Screening Every 2-10 years based on history Starting at age 65 for women Recommendations for men differ based on medication usage, history, and risk factors AAA Screening One time ultrasound Men 27-63 years old who have ever smoked Lung Cancer Screening Low Dose Lung CT every 12 months Age 4-80 years with a 20 pack-year smoking history who still smoke or who have quit within the last 15 years  Screening Labs Routine  Labs: Complete Blood Count (CBC), Complete Metabolic Panel (CMP), Cholesterol (Lipid  Panel) Every 6-12 months based on history and medications May be recommended more frequently based on current conditions or previous results Hemoglobin A1c Lab Every 3-12 months based on history and previous results Starting at age 47 or earlier with diagnosis of diabetes, high cholesterol, BMI >26, and/or risk factors Frequent monitoring for patients with diabetes to ensure blood sugar control Thyroid Panel  Every 6 months based on history, symptoms, and risk factors May be repeated more often if on medication HIV One time testing for all patients 21 and older May be repeated more frequently for patients with increased risk factors or exposure Hepatitis C One time testing for all patients 28 and older May be repeated more frequently for patients with increased risk factors or exposure Gonorrhea, Chlamydia Every 12 months for all sexually active persons 13-24 years Additional monitoring may be recommended for those who are considered high risk or who have symptoms PSA Men 41-54 years old with risk factors Additional screening may be recommended from age 22-69 based on risk factors, symptoms, and history  Vaccine Recommendations Tetanus Booster All adults every 10 years Flu Vaccine All patients 6 months and older every year COVID Vaccine All patients 12 years and older Initial  dosing with booster May recommend additional booster based on age and health history HPV Vaccine 2 doses all patients age 21-26 Dosing may be considered for patients over 26 Shingles Vaccine (Shingrix) 2 doses all adults 32 years and older Pneumonia (Pneumovax 23) All adults 84 years and older May recommend earlier dosing based on health history Pneumonia (Prevnar 101) All adults 26 years and older Dosed 1 year after Pneumovax 23 Pneumonia (Prevnar 13) All adults 29 years and older (adults 37-16 with certain conditions or risk factors) 1 dose  For those who have not received Prevnar 13 vaccine  previously   Additional Screening, Testing, and Vaccinations may be recommended on an individualized basis based on family history, health history, risk factors, and/or exposure.  __________________________________________________________  Diet Recommendations for All Patients  I recommend that all patients maintain a diet low in saturated fats, carbohydrates, and cholesterol. While this can be challenging at first, it is not impossible and small changes can make big differences.  Things to try: Decreasing the amount of soda, sweet tea, and/or juice to one or less per day and replace with water While water is always the first choice, if you do not like water you may consider adding a water additive without sugar to improve the taste other sugar free drinks Replace potatoes with a brightly colored vegetable  Use healthy oils, such as canola oil or olive oil, instead of butter or hard margarine Limit your bread intake to two pieces or less a day Replace regular pasta with low carb pasta options Bake, broil, or grill foods instead of frying Monitor portion sizes  Eat smaller, more frequent meals throughout the day instead of large meals  An important thing to remember is, if you love foods that are not great for your health, you don't have to give them up completely. Instead, allow these foods to be a reward when you have done well. Allowing yourself to still have special treats every once in a while is a nice way to tell yourself thank you for working hard to keep yourself healthy.   Also remember that every day is a new day. If you have a bad day and "fall off the wagon", you can still climb right back up and keep moving along on your journey!  We have resources available to help you!  Some websites that may be helpful include: www.http://carter.biz/  Www.VeryWellFit.com _____________________________________________________________  Activity Recommendations for All Patients  I recommend  that all adults get at least 20 minutes of moderate physical activity that elevates your heart rate at least 5 days out of the week.  Some examples include: Walking or jogging at a pace that allows you to carry on a conversation Cycling (stationary bike or outdoors) Water aerobics Yoga Weight lifting Dancing If physical limitations prevent you from putting stress on your joints, exercise in a pool or seated in a chair are excellent options.  Do determine your MAXIMUM heart rate for activity: 220 - YOUR AGE = MAX Heart Rate   Remember! Do not push yourself too hard.  Start slowly and build up your pace, speed, weight, time in exercise, etc.  Allow your body to rest between exercise and get good sleep. You will need more water than normal when you are exerting yourself. Do not wait until you are thirsty to drink. Drink with a purpose of getting in at least 8, 8 ounce glasses of water a day plus more depending on how much you exercise and sweat.  If you begin to develop dizziness, chest pain, abdominal pain, jaw pain, shortness of breath, headache, vision changes, lightheadedness, or other concerning symptoms, stop the activity and allow your body to rest. If your symptoms are severe, seek emergency evaluation immediately. If your symptoms are concerning, but not severe, please let us know so that we can recommend further evaluation.

## 2022-11-20 NOTE — Progress Notes (Signed)
New Patient Office Visit  Subjective    Patient ID: [name], female    DOB: Jul 16, 1951  Age: 72 y.o. MRN: [id number]  CC:  Chief Complaint  Patient presents with   Establish Care    Pain in right knee     HPI [name] presents to establish care. She is transferring from [facility] because insurance has changed.    Right knee pain: Onset: 3 weeks ago while working out at [employer] she heard/felt a pop in right knee Location: right knee, posterior primarily Duration: constant, worse with activity Characteristics: stiff, posterior/deep aching, pops occasionally, sometimes feels unstable, swelling initially (none now) Aggravating factors: full flexion, kneeling, changing position Alleviating factors: ice, bio-freeze, rest Radiating/associated symptoms: no radiation or associated symptoms Timing: intermittent Severity: currently 6/10 baseline    Hypertension: - Medications: lisinopril-hctz 10-12.5 mg daily - Compliance: fairly good, forgets occasionally  - Checking BP at home: yes, 120s/70s - Denies any SOB, recurrent headaches, CP, vision changes, LE edema, dizziness, palpitations, or medication side effects. - Diet: low sodium - Exercise: goes to the gym 3 days a week   Hyperlipidemia: - medications: Zetia 10 mg daily (states Lipitor was not tolerated) - compliance: good - medication SEs: none The ASCVD Risk score (Arnett DK, et al., 2019) failed to calculate for the following reasons:   Cannot find a previous HDL lab   Cannot find a previous total cholesterol lab      Outpatient Encounter Medications as of 11/20/2022  Medication Sig   aspirin EC 81 MG tablet Take 1 tablet (81 mg total) by mouth daily.   ezetimibe (ZETIA) 10 MG tablet Take 1 tablet by mouth daily.   lisinopril-hydrochlorothiazide (PRINZIDE,ZESTORETIC) 10-12.5 MG tablet 1 TABLET ONCE A DAY ORALLY 90 DAYS   [DISCONTINUED] COVID-19 mRNA bivalent vaccine, Pfizer,  injection Inject into the muscle. (Patient not taking: Reported on 07/22/2022)   [DISCONTINUED] COVID-19 mRNA Vac-TriS, Pfizer, SUSP injection Inject into the muscle.   [DISCONTINUED] COVID-19 mRNA vaccine 2023-2024 (COMIRNATY) syringe Inject into the muscle.   No facility-administered encounter medications on file as of 11/20/2022.    Past Medical History:  Diagnosis Date   Cancer ([place])    Basal cell carcinoma of the nose.   Gastroparesis    Hyperlipidemia    Hypertension    Osteopenia     Past Surgical History:  Procedure Laterality Date   CATARACT EXTRACTION Bilateral     Family History  Problem Relation Age of Onset   Hypertension Mother    COPD Father     Social History   Socioeconomic History   Marital status: Divorced    Spouse name: Not on file   Number of children: Not on file   Years of education: Not on file   Highest education level: Not on file  Occupational History   Not on file  Tobacco Use   Smoking status: Never   Smokeless tobacco: Never  Substance and Sexual Activity   Alcohol use: Not on file   Drug use: Not on file   Sexual activity: Not on file  Other Topics Concern   Not on file  Social History Narrative   Not on file   Social Determinants of Health   Financial Resource Strain: Not on file  Food Insecurity: Not on file  Transportation Needs: Not on file  Physical Activity: Not on file  Stress: Not on file  Social Connections: Not on file  Intimate Partner Violence:  Not on file    ROS All review of systems negative except what is listed in the HPI      Objective    BP (!) 138/54   Pulse 72   Temp 98.1 F (36.7 C)   Resp 16   Ht 5' 1.5" (1.562 m)   Wt 163 lb 6.4 oz (74.1 kg)   SpO2 100%   BMI 30.37 kg/m   Physical Exam Vitals reviewed.  Constitutional:      Appearance: Normal appearance.  Cardiovascular:     Rate and Rhythm: Normal rate and regular rhythm.     Pulses: Normal pulses.     Heart sounds: Normal  heart sounds.  Pulmonary:     Effort: Pulmonary effort is normal.     Breath sounds: Normal breath sounds.  Musculoskeletal:        General: No swelling or tenderness. Normal range of motion.     Comments: Right knee: Negative anterior/posterior drawer; mild discomfort with McMurray and full flexion  Skin:    General: Skin is warm and dry.  Neurological:     Mental Status: She is alert and oriented to person, place, and time.  Psychiatric:        Mood and Affect: Mood normal.        Behavior: Behavior normal.        Thought Content: Thought content normal.        Judgment: Judgment normal.         Assessment & Plan:   Problem List Items Addressed This Visit       Cardiovascular and Mediastinum   Essential hypertension (Chronic)    Blood pressure is at goal for age and co-morbidities.   Recommendations: lisinopril-hctz 12-12.5 mg daily - BP goal <130/80 - monitor and log blood pressures at home - check around the same time each day in a relaxed setting - Limit salt to <2000 mg/day - Follow DASH eating plan (heart healthy diet) - limit alcohol to 2 standard drinks per day for men and 1 per day for women - avoid tobacco products - get at least 2 hours of regular aerobic exercise weekly Patient aware of signs/symptoms requiring further/urgent evaluation. Labs deferred - she prefers to schedule physical in the near future         Other   Dyslipidemia, goal to be determined (Chronic)    -Medication management: continue Zetia 10 mg daily -Diet low in saturated fat -Regular exercise - at least 30 minutes, 5 times per week       Other Visit Diagnoses     Acute pain of right knee    -  Primary Mild discomfort with McMurray, otherwise normal exam. Xrays and referral to sports medicine In the meantime, occasion ice, NSAIDs, stretching. Consider physical therapy.     Relevant Orders   DG Knee Complete 4 Views Right   DG Knee 1-2 Views Left   Ambulatory referral to  Sports Medicine   Encounter to establish care     Colonoscopy - October 2023 - 3 precancerous polyps, repeat in 3 years (request records) Mammogram - normal last year ([facility])       Return for CPE with labs at your convenience .   Terrilyn Saver, NP

## 2022-11-25 ENCOUNTER — Encounter: Payer: Self-pay | Admitting: Family Medicine

## 2022-11-25 ENCOUNTER — Ambulatory Visit: Payer: Self-pay

## 2022-11-25 ENCOUNTER — Ambulatory Visit (INDEPENDENT_AMBULATORY_CARE_PROVIDER_SITE_OTHER): Payer: Medicare Other | Admitting: Family Medicine

## 2022-11-25 VITALS — BP 128/72 | Ht 61.5 in | Wt 160.0 lb

## 2022-11-25 DIAGNOSIS — M25561 Pain in right knee: Secondary | ICD-10-CM

## 2022-11-25 DIAGNOSIS — M76891 Other specified enthesopathies of right lower limb, excluding foot: Secondary | ICD-10-CM | POA: Diagnosis not present

## 2022-11-25 NOTE — Progress Notes (Signed)
  [name] - 72 y.o. female MRN [id number]  Date of birth: [birth year]  SUBJECTIVE:  Including CC & ROS.  No chief complaint on file.   DEMEKA Flores is a 72 y.o. female that is presenting with acute right knee pain.  The pain started 2 weeks ago when she felt a pop while at the gym.  She was performing an leg curl.  No history of similar pain or history of fracture.  Independent review of the right knee x-ray from 1/31 shows no acute changes.  Review of Systems See HPI   HISTORY: Past Medical, Surgical, Social, and Family History Reviewed & Updated per EMR.   Pertinent Historical Findings include:  Past Medical History:  Diagnosis Date   Cancer ([place])    Basal cell carcinoma of the nose.   Gastroparesis    Hyperlipidemia    Hypertension    Osteopenia     Past Surgical History:  Procedure Laterality Date   CATARACT EXTRACTION Bilateral      PHYSICAL EXAM:  VS: BP 128/72   Ht 5' 1.5" (1.562 m)   Wt 160 lb (72.6 kg)   BMI 29.74 kg/m  Physical Exam Gen: NAD, alert, cooperative with exam, well-appearing MSK:  Neurovascularly intact    Limited ultrasound: Right knee pain:  No effusion suprapatellar pouch. Normal-appearing quadricep and patellar tendon. Normal-appearing medial joint space. Thickening and hypoechoic change within the popliteus muscle.  Summary: Findings consistent with a strain of the popliteus  Ultrasound and interpretation by Clearance Coots, MD    ASSESSMENT & PLAN:   Popliteus tendinitis of right lower extremity Acutely occurring.  No significant structural changes appreciated of the knee joint.  Changes appear more with the popliteus  -Counseled on home exercise therapy and supportive care. -Counseled on compression. -Could consider shockwave therapy or physical therapy.

## 2022-11-25 NOTE — Assessment & Plan Note (Signed)
Acutely occurring.  No significant structural changes appreciated of the knee joint.  Changes appear more with the popliteus  -Counseled on home exercise therapy and supportive care. -Counseled on compression. -Could consider shockwave therapy or physical therapy.

## 2022-11-25 NOTE — Patient Instructions (Signed)
Nice to meet you Please use ice as needed  Please try the exercises  You can try voltaren over the counter gel if needed  You can consider compression   Please send me a message in [place] with any questions or updates.  Please see me back in 4 weeks or as needed if better.   --Dr. Raeford Razor

## 2022-12-19 ENCOUNTER — Ambulatory Visit (INDEPENDENT_AMBULATORY_CARE_PROVIDER_SITE_OTHER): Payer: Medicare Other | Admitting: Family Medicine

## 2022-12-19 ENCOUNTER — Encounter: Payer: Self-pay | Admitting: Family Medicine

## 2022-12-19 VITALS — BP 122/64 | HR 77 | Temp 97.8°F | Resp 18 | Ht 61.5 in | Wt 161.4 lb

## 2022-12-19 DIAGNOSIS — Z0000 Encounter for general adult medical examination without abnormal findings: Secondary | ICD-10-CM | POA: Diagnosis not present

## 2022-12-19 DIAGNOSIS — E785 Hyperlipidemia, unspecified: Secondary | ICD-10-CM

## 2022-12-19 DIAGNOSIS — M76891 Other specified enthesopathies of right lower limb, excluding foot: Secondary | ICD-10-CM | POA: Diagnosis not present

## 2022-12-19 DIAGNOSIS — I10 Essential (primary) hypertension: Secondary | ICD-10-CM

## 2022-12-19 DIAGNOSIS — Z1159 Encounter for screening for other viral diseases: Secondary | ICD-10-CM

## 2022-12-19 LAB — CBC WITH DIFFERENTIAL/PLATELET
Basophils Absolute: 0 10*3/uL (ref 0.0–0.1)
Basophils Relative: 0.5 % (ref 0.0–3.0)
Eosinophils Absolute: 0.3 10*3/uL (ref 0.0–0.7)
Eosinophils Relative: 4 % (ref 0.0–5.0)
HCT: 40.4 % (ref 36.0–46.0)
Hemoglobin: 13.2 g/dL (ref 12.0–15.0)
Lymphocytes Relative: 41.4 % (ref 12.0–46.0)
Lymphs Abs: 2.7 10*3/uL (ref 0.7–4.0)
MCHC: 32.8 g/dL (ref 30.0–36.0)
MCV: 89.5 fl (ref 78.0–100.0)
Monocytes Absolute: 0.5 10*3/uL (ref 0.1–1.0)
Monocytes Relative: 7.6 % (ref 3.0–12.0)
Neutro Abs: 3 10*3/uL (ref 1.4–7.7)
Neutrophils Relative %: 46.5 % (ref 43.0–77.0)
Platelets: 246 10*3/uL (ref 150.0–400.0)
RBC: 4.51 Mil/uL (ref 3.87–5.11)
RDW: 13.8 % (ref 11.5–15.5)
WBC: 6.5 10*3/uL (ref 4.0–10.5)

## 2022-12-19 LAB — COMPREHENSIVE METABOLIC PANEL
ALT: 18 U/L (ref 0–35)
AST: 23 U/L (ref 0–37)
Albumin: 4.2 g/dL (ref 3.5–5.2)
Alkaline Phosphatase: 72 U/L (ref 39–117)
BUN: 16 mg/dL (ref 6–23)
CO2: 29 mEq/L (ref 19–32)
Calcium: 9.8 mg/dL (ref 8.4–10.5)
Chloride: 103 mEq/L (ref 96–112)
Creatinine, Ser: 0.95 mg/dL (ref 0.40–1.20)
GFR: 59.87 mL/min — ABNORMAL LOW (ref >60.00)
Glucose, Bld: 80 mg/dL (ref 70–99)
Potassium: 4.3 mEq/L (ref 3.5–5.1)
Sodium: 143 mEq/L (ref 135–145)
Total Bilirubin: 0.5 mg/dL (ref 0.2–1.2)
Total Protein: 6.8 g/dL (ref 6.0–8.3)

## 2022-12-19 LAB — TSH: TSH: 3.46 u[IU]/mL (ref 0.35–5.50)

## 2022-12-19 LAB — LIPID PANEL
Cholesterol: 208 mg/dL — ABNORMAL HIGH (ref 0–200)
HDL: 48.6 mg/dL (ref >39.00)
LDL Cholesterol: 130 mg/dL — ABNORMAL HIGH (ref 0–99)
NonHDL: 159.82
Total CHOL/HDL Ratio: 4
Triglycerides: 147 mg/dL (ref 0.0–149.0)
VLDL: 29.4 mg/dL (ref 0.0–40.0)

## 2022-12-19 NOTE — Progress Notes (Signed)
Complete physical exam  [name]   DOB: 1951-08-07   72 y.o. Female  MRN: [id number]  Subjective:    Chief Complaint  Patient presents with   Annual Exam    Concerns/ questions: none AWV due Hep C scree due Colonoscopy: done last year [facility] Flu shot today: at pt says she has reviewed this at [facility] Pneumonia: she says she had this at [employer] [facility]  Tdap due, Shingrix due- should receive at pharmacy    [name] is a 72 y.o. female who presents today for a complete physical exam. She reports consuming a general diet. Gym/ health club routine includes elliptical, weight lifting, . She generally feels well. She reports sleeping well. She does not have additional problems to discuss today.    Most recent fall risk assessment:    12/19/2022    8:57 AM  [place] in the past year? 0  Number falls in past yr: 0  Injury with Fall? 0  Risk for fall due to : No Fall Risks  Follow up Falls evaluation completed     Most recent depression screenings:    12/19/2022    8:57 AM 11/20/2022    1:40 PM  PHQ 2/9 Scores  PHQ - 2 Score 0 0  PHQ- 9 Score 0 1    Vision:Not within last year  and Dental: No current dental problems and No regular dental care   Patient Active Problem List   Diagnosis Date Noted   Popliteus tendinitis of right lower extremity 11/25/2022   Dyslipidemia, goal to be determined 02/10/2017   Essential hypertension 02/10/2017   Family History  Problem Relation Age of Onset   Hypertension Mother    COPD Father    Allergies  Allergen Reactions   Celebrex [Celecoxib]    Elemental Sulfur    Flagyl [Metronidazole]    Reglan [Metoclopramide] Other (See Comments)    DT      Patient Care Team: Terrilyn Saver, NP as PCP - General (Family Medicine) Gerri Spore, MD as Referring Physician (Geriatric Medicine)   Outpatient Medications Prior to Visit  Medication Sig   aspirin EC 81 MG tablet Take 1 tablet  (81 mg total) by mouth daily.   ezetimibe (ZETIA) 10 MG tablet Take 1 tablet by mouth daily.   lisinopril-hydrochlorothiazide (PRINZIDE,ZESTORETIC) 10-12.5 MG tablet 1 TABLET ONCE A DAY ORALLY 90 DAYS   No facility-administered medications prior to visit.    ROS All review of systems negative except what is listed in the HPI        Objective:     BP 122/64 (BP Location: Left Arm, Patient Position: Sitting, Cuff Size: Normal)   Pulse 77   Temp 97.8 F (36.6 C) (Oral)   Resp 18   Ht 5' 1.5" (1.562 m)   Wt 161 lb 6.4 oz (73.2 kg)   SpO2 100%   BMI 30.00 kg/m    Physical Exam Vitals reviewed.  Constitutional:      General: She is not in acute distress.    Appearance: Normal appearance. She is not ill-appearing.  HENT:     Head: Normocephalic and atraumatic.     Right Ear: Tympanic membrane normal.     Left Ear: Tympanic membrane normal.     Nose: Nose normal.     Mouth/Throat:     Mouth: Mucous membranes are moist.     Pharynx: Oropharynx is clear.  Eyes:  Extraocular Movements: Extraocular movements intact.     Conjunctiva/sclera: Conjunctivae normal.     Pupils: Pupils are equal, round, and reactive to light.  Neck:     Vascular: No carotid bruit.  Cardiovascular:     Rate and Rhythm: Normal rate and regular rhythm.     Pulses: Normal pulses.     Heart sounds: Normal heart sounds.  Pulmonary:     Effort: Pulmonary effort is normal.     Breath sounds: Normal breath sounds.  Abdominal:     General: Abdomen is flat. Bowel sounds are normal. There is no distension.     Palpations: Abdomen is soft. There is no mass.     Tenderness: There is no abdominal tenderness. There is no right CVA tenderness, left CVA tenderness, guarding or rebound.  Genitourinary:    Comments: Deferred exam Musculoskeletal:        General: Normal range of motion.     Cervical back: Normal range of motion and neck supple. No tenderness.     Right lower leg: No edema.     Left lower  leg: No edema.  Lymphadenopathy:     Cervical: No cervical adenopathy.  Skin:    General: Skin is warm and dry.     Capillary Refill: Capillary refill takes less than 2 seconds.  Neurological:     General: No focal deficit present.     Mental Status: She is alert and oriented to person, place, and time. Mental status is at baseline.  Psychiatric:        Mood and Affect: Mood normal.        Behavior: Behavior normal.        Thought Content: Thought content normal.        Judgment: Judgment normal.      No results found for any visits on 12/19/22.     Assessment & Plan:    Routine Health Maintenance and Physical Exam  Immunization History  Administered Date(s) Administered   COVID-19, mRNA, vaccine(Comirnaty)12 years and older 08/07/2022   PFIZER Comirnaty(Gray Top)Covid-19 Tri-Sucrose Vaccine 05/14/2021   PFIZER(Purple Top)SARS-COV-2 Vaccination 11/09/2019, 11/30/2019   Pfizer Covid-19 Vaccine Bivalent Booster 43yr & up 07/12/2021    Health Maintenance  Topic Date Due   Medicare Annual Wellness (AWV)  Never done   Hepatitis C Screening  Never done   DTaP/Tdap/Td (1 - Tdap) Never done   Zoster Vaccines- Shingrix (1 of 2) Never done   COLONOSCOPY (Pts 45-469yrInsurance coverage will need to be confirmed)  Never done   Pneumonia Vaccine 6558Years old (1 of 1 - PCV) Never done   INFLUENZA VACCINE  Never done   COVID-19 Vaccine (6 - 2023-24 season) 10/02/2022   MAMMOGRAM  08/07/2024   DEXA SCAN  Completed   HPV VACCINES  Aged Out    Discussed health benefits of physical activity, and encouraged her to engage in regular exercise appropriate for her age and condition.  Problem List Items Addressed This Visit       Cardiovascular and Mediastinum   Essential hypertension (Chronic)    Blood pressure is at goal for age and co-morbidities.   Recommendations: lisinopril-hctz 12-12.5 mg daily - BP goal <130/80 - monitor and log blood pressures at home - check around the  same time each day in a relaxed setting - Limit salt to <2000 mg/day - Follow DASH eating plan (heart healthy diet) - limit alcohol to 2 standard drinks per day for men and 1 per day for  women - avoid tobacco products - get at least 2 hours of regular aerobic exercise weekly Patient aware of signs/symptoms requiring further/urgent evaluation. Labs ordered       Relevant Orders   Comprehensive metabolic panel   Lipid panel   TSH     Musculoskeletal and Integument   Popliteus tendinitis of right lower extremity    Following with sports medicine Decided she would like to try PT - I will place the referral       Relevant Orders   Ambulatory referral to Physical Therapy     Other   Dyslipidemia, goal to be determined (Chronic)    -Medication management: continue Zetia 10 mg daily -Diet low in saturated fat -Regular exercise - at least 30 minutes, 5 times per week -Labs today       Relevant Orders   Comprehensive metabolic panel   Lipid panel   Other Visit Diagnoses     Annual physical exam    -  Primary   Relevant Orders   Comprehensive metabolic panel   CBC with Differential/Platelet   Lipid panel   TSH   Hepatitis C antibody   Encounter for hepatitis C screening test for low risk patient       Relevant Orders   Hepatitis C antibody      Return in about 6 months (around 06/19/2023) for routine follow-up; schedule AWV.     Terrilyn Saver, NP

## 2022-12-19 NOTE — Assessment & Plan Note (Signed)
Blood pressure is at goal for age and co-morbidities.   Recommendations: lisinopril-hctz 12-12.5 mg daily - BP goal <130/80 - monitor and log blood pressures at home - check around the same time each day in a relaxed setting - Limit salt to <2000 mg/day - Follow DASH eating plan (heart healthy diet) - limit alcohol to 2 standard drinks per day for men and 1 per day for women - avoid tobacco products - get at least 2 hours of regular aerobic exercise weekly Patient aware of signs/symptoms requiring further/urgent evaluation. Labs ordered

## 2022-12-19 NOTE — Assessment & Plan Note (Signed)
Following with sports medicine Decided she would like to try PT - I will place the referral

## 2022-12-19 NOTE — Assessment & Plan Note (Signed)
-  Medication management: continue Zetia 10 mg daily -Diet low in saturated fat -Regular exercise - at least 30 minutes, 5 times per week -Labs today

## 2022-12-20 LAB — HEPATITIS C ANTIBODY: Hepatitis C Ab: NONREACTIVE

## 2022-12-20 MED ORDER — ROSUVASTATIN CALCIUM 5 MG PO TABS: 5.0000 mg | ORAL_TABLET | Freq: Every day | ORAL | 1 refills | Status: DC

## 2022-12-20 NOTE — Progress Notes (Signed)
Metabolic panel is stable. Renal function is slightly better than last time.  Cholesterol is high. I would recommend starting Crestor and rechecking in about 3 months. We can see if you tolerate this better than the Lipitor you tried previously. Blood counts are normal Thyroid is normal  Lifestyle factors for lowering cholesterol include: Diet therapy - heart-healthy diet rich in fruits, veggies, fiber-rich whole grains, lean meats, chicken, fish (at least twice a week), fat-free or 1% dairy products; foods low in saturated/trans fats, cholesterol, sodium, and sugar. Mediterranean diet has shown to be very heart healthy. Regular exercise - recommend at least 30 minutes a day, 5 times per week Weight management     The 10-year ASCVD risk score (Arnett DK, et al., 2019) is: 12.3%   Values used to calculate the score:     Age: 72 years     Sex: Female     Is Non-Hispanic African American: Yes     Diabetic: No     Tobacco smoker: No     Systolic Blood Pressure: 123XX123 mmHg     Is BP treated: Yes     HDL Cholesterol: 48.6 mg/dL     Total Cholesterol: 208 mg/dL

## 2022-12-20 NOTE — Addendum Note (Signed)
Addended by: Caleen Jobs B on: 12/20/2022 01:59 PM   Modules accepted: Orders

## 2022-12-27 DIAGNOSIS — H401131 Primary open-angle glaucoma, bilateral, mild stage: Secondary | ICD-10-CM | POA: Diagnosis not present

## 2022-12-27 DIAGNOSIS — H52223 Regular astigmatism, bilateral: Secondary | ICD-10-CM | POA: Diagnosis not present

## 2022-12-27 DIAGNOSIS — H524 Presbyopia: Secondary | ICD-10-CM | POA: Diagnosis not present

## 2022-12-27 DIAGNOSIS — Z961 Presence of intraocular lens: Secondary | ICD-10-CM | POA: Diagnosis not present

## 2022-12-27 DIAGNOSIS — H35033 Hypertensive retinopathy, bilateral: Secondary | ICD-10-CM | POA: Diagnosis not present

## 2022-12-27 DIAGNOSIS — H26493 Other secondary cataract, bilateral: Secondary | ICD-10-CM | POA: Diagnosis not present

## 2022-12-27 DIAGNOSIS — H43393 Other vitreous opacities, bilateral: Secondary | ICD-10-CM | POA: Diagnosis not present

## 2022-12-27 DIAGNOSIS — H43811 Vitreous degeneration, right eye: Secondary | ICD-10-CM | POA: Diagnosis not present

## 2022-12-27 DIAGNOSIS — H5202 Hypermetropia, left eye: Secondary | ICD-10-CM | POA: Diagnosis not present

## 2022-12-31 ENCOUNTER — Other Ambulatory Visit: Payer: Self-pay

## 2022-12-31 ENCOUNTER — Ambulatory Visit: Payer: Medicare Other | Attending: Family Medicine

## 2022-12-31 ENCOUNTER — Encounter: Payer: Self-pay | Admitting: *Deleted

## 2022-12-31 DIAGNOSIS — R262 Difficulty in walking, not elsewhere classified: Secondary | ICD-10-CM | POA: Diagnosis not present

## 2022-12-31 DIAGNOSIS — M25562 Pain in left knee: Secondary | ICD-10-CM | POA: Insufficient documentation

## 2022-12-31 DIAGNOSIS — M25469 Effusion, unspecified knee: Secondary | ICD-10-CM | POA: Insufficient documentation

## 2022-12-31 DIAGNOSIS — M25561 Pain in right knee: Secondary | ICD-10-CM | POA: Insufficient documentation

## 2022-12-31 DIAGNOSIS — M76891 Other specified enthesopathies of right lower limb, excluding foot: Secondary | ICD-10-CM | POA: Diagnosis not present

## 2022-12-31 NOTE — Therapy (Signed)
OUTPATIENT PHYSICAL THERAPY LOWER EXTREMITY EVALUATION   Patient Name: [name] MRN: [id number] DOB:1950/11/03, 72 y.o., female Today's Date: 12/31/2022  END OF SESSION:  PT End of Session - 12/31/22 1736     Visit Number 1    Date for PT Re-Evaluation 03/25/23    PT Start Time 1400    PT Stop Time 1445    PT Time Calculation (min) 45 min    Activity Tolerance Patient tolerated treatment well    Behavior During Therapy WFL for tasks assessed/performed             Past Medical History:  Diagnosis Date   Cancer ([place])    Basal cell carcinoma of the nose.   Gastroparesis    Hyperlipidemia    Hypertension    Osteopenia    Past Surgical History:  Procedure Laterality Date   CATARACT EXTRACTION Bilateral    Patient Active Problem List   Diagnosis Date Noted   Popliteus tendinitis of right lower extremity 11/25/2022   Dyslipidemia, goal to be determined 02/10/2017   Essential hypertension 02/10/2017    PCP: Caleen Jobs, NP  REFERRING PROVIDER: Patrica Duel, MD  REFERRING DIAG: R popliteus strain  THERAPY DIAG:  Acute pain of left knee  Edema of knee  Difficulty in walking, not elsewhere classified  Rationale for Evaluation and Treatment: Rehabilitation  ONSET DATE: 11/13/22  SUBJECTIVE:   SUBJECTIVE STATEMENT: I was working out at [employer] like I usually do and I wasn't paying attention and had too much weight on it when I was doing a hamstring curl, and my R knee popped, and really hurt.  I limped around on it a lot, it was hard to straighten out at first, and clicked and popped.  I've had to cut down on my activity level a lot, particularly not lifting weights, still working out with elliptical and treadmill but have had to reduce the distance a lot.  PERTINENT HISTORY: Healthy female. Retired, active .  Injury to L knee in gym PAIN:  Are you having pain? Yes: NPRS scale: 2/10 Pain location: R knee lateral over TFL and post knee Pain  description: pain with pressure behind knee and with twisting knee Aggravating factors: twisting Relieving factors: icing, resting  PRECAUTIONS: None  WEIGHT BEARING RESTRICTIONS: No  FALLS:  Has patient fallen in last 6 months? No  LIVING ENVIRONMENT: Lives with: lives with their spouse Lives in: House/apartment Stairs: No] Has following equipment at home: None  OCCUPATION: retired  PLOF: Independent  PATIENT GOALS: I want to be able to work out , get my knee better  NEXT MD VISIT: non scheduled but to contact them when done here  OBJECTIVE:  Copied from MD note 11/25/22: DIAGNOSTIC FINDINGS: Limited ultrasound: Right knee pain:   No effusion suprapatellar pouch. Normal-appearing quadricep and patellar tendon. Normal-appearing medial joint space. Thickening and hypoechoic change within the popliteus muscle.   Summary: Findings consistent with a strain of the popliteus    PATIENT SURVEYS:  LEFS 49/80 , 61%  COGNITION: Overall cognitive status: Within functional limits for tasks assessed     SENSATION: WFL  EDEMA: 1+ R knee   POSTURE: rounded shoulders  PALPATION: Exquisitely tender medial meniscus site R knee, also in prone over medial hamstring distally, and mid thigh along TFL  LOWER EXTREMITY ROM:  Active ROM Right eval Left eval  Hip flexion    Hip extension    Hip abduction    Hip adduction    Hip  internal rotation    Hip external rotation    Knee flexion 128 140  Knee extension    Ankle dorsiflexion    Ankle plantarflexion    Ankle inversion    Ankle eversion     (Blank rows = not tested)  LOWER EXTREMITY MMT:  MMT Right eval Left eval  Hip flexion 5 5  Hip extension    Hip abduction    Hip adduction    Hip internal rotation    Hip external rotation    Knee flexion 4- 5  Knee extension 4-   Ankle dorsiflexion    Ankle plantarflexion 4-   Ankle inversion    Ankle eversion     (Blank rows = not tested)  LOWER EXTREMITY  SPECIAL TESTS:  Posterior drawer R +  FUNCTIONAL TESTS:  6 minute walk test: needs test next visit Unilateral stance: R 20 sec( increased sway noted), L over 30 sec  GAIT: Distance walked: 30' within clinic Assistive device utilized: None Level of assistance: Complete Independence Comments: decreased arm swing R, slight decrease in stance time on R   TODAY'S TREATMENT:                                                                                                                              DATE: 12/31/22    PATIENT EDUCATION:  Education details: POC, goals Person educated: Patient Education method: Explanation and Demonstration Education comprehension: verbalized understanding  HOME EXERCISE PROGRAM: Access Code: [email or web address] URL: [email or web address] Date: 12/31/2022 Prepared by: Warren Lacy Nikolaos Maddocks  Exercises - Supine Bridge  - 1 x daily - 7 x weekly - 3 sets - 10 reps - Seated Long Arc Quad  - 1 x daily - 7 x weekly - 3 sets - 10 reps - Seated Ankle Plantarflexion with Resistance  - 1 x daily - 7 x weekly - 3 sets - 10 reps  ASSESSMENT:  CLINICAL IMPRESSION: Patient is a 72 y.o. female, who was seen today for physical therapy evaluation and treatment for R popliteus muscle strain.  She is very active, push mows and weed eats her yard, goes to the gym 3 x weekly for cardiovascular ex and strength training.  It has been approximately 6 weeks since she injured her R knee performing a weighted hamstring curl.  She presents with mild 1+ edema R knee, decreased ROM R knee for flexion, decreased strength R quads, hamstrings, hip abductors, hip extensors, and plantar flexors.  Her R knee pain is easily provoked with rotational stress, palpation along the medial joint line, as well as palpation along TFL, and semimembranosus tendon R.  She also presents with some stability/ balance deficits on the R.  She should benefit from skilled physical therapy to address her deficits.   Initial focus should be on improving her hip and ankle , and quads strength and stability.  Would avoid deep squats and resisted quads strengthening from 30 to 0 degrees extension currently due  to her tenderness along medial meniscus and pain with twisting.  May benefit from isometrics for R quads and hamstrings initially at different angles of knee flexion.  Needs to progress to closed chain stability when able.   OBJECTIVE IMPAIRMENTS: Abnormal gait, decreased balance, difficulty walking, decreased ROM, decreased strength, and pain.   ACTIVITY LIMITATIONS: carrying, lifting, bending, sitting, standing, and squatting  PARTICIPATION LIMITATIONS: community activity and yard work  PERSONAL FACTORS: 1 comorbidity: osteopenia  are also affecting patient's functional outcome.   REHAB POTENTIAL: Good  CLINICAL DECISION MAKING: Stable/uncomplicated  EVALUATION COMPLEXITY: Low   GOALS: Goals reviewed with patient? Yes  SHORT TERM GOALS: Target date: 01/14/23 I Hep for long term condition management Baseline:initiated today Goal status: INITIAL   LONG TERM GOALS: Target date: 03/25/23  LEFS improve to 60/80 Baseline: 49 Goal status: INITIAL  2.  Improve strength R quads, hamstrings to wfl R LE Baseline: 4-/5 Goal status: INITIAL  3.  Improve unilateral balancing on R LE to 30 sec Baseline: 20 sec  Goal status: INITIAL  4.  6 min walk test without deficits or increased pain R knee Baseline: not assessed, needs assessment in next 2 to 3 visits Goal status: INITIAL  PLAN:  PT FREQUENCY: 1-2x/week  PT DURATION: 12 weeks  PLANNED INTERVENTIONS: Therapeutic exercises, Therapeutic activity, Neuromuscular re-education, Balance training, Gait training, Patient/Family education, Self Care, and Joint mobilization  PLAN FOR NEXT SESSION: due to point tenderness over medial meniscus medial joint line and along TFL would consider iontophoresis, also progress with isometric and eccentric hip  extensor and quads strengthening.   Amethyst Gainer L Darling Cieslewicz, PT 12/31/2022, 6:02 PM

## 2023-01-07 ENCOUNTER — Ambulatory Visit: Payer: Medicare Other

## 2023-01-07 ENCOUNTER — Other Ambulatory Visit: Payer: Self-pay

## 2023-01-07 DIAGNOSIS — R262 Difficulty in walking, not elsewhere classified: Secondary | ICD-10-CM

## 2023-01-07 DIAGNOSIS — M25562 Pain in left knee: Secondary | ICD-10-CM

## 2023-01-07 DIAGNOSIS — M25469 Effusion, unspecified knee: Secondary | ICD-10-CM

## 2023-01-07 DIAGNOSIS — M25561 Pain in right knee: Secondary | ICD-10-CM | POA: Diagnosis not present

## 2023-01-07 DIAGNOSIS — M76891 Other specified enthesopathies of right lower limb, excluding foot: Secondary | ICD-10-CM | POA: Diagnosis not present

## 2023-01-07 NOTE — Therapy (Signed)
OUTPATIENT PHYSICAL THERAPY LOWER EXTREMITY EVALUATION   Patient Name: [name] MRN: [id number] DOB:Nov 04, 1950, 72 y.o., female Today's Date: 01/07/2023  END OF SESSION:  PT End of Session - 01/07/23 1741     Visit Number 2    Date for PT Re-Evaluation 03/25/23    PT Start Time 1445    PT Stop Time 1530    PT Time Calculation (min) 45 min    Activity Tolerance Patient tolerated treatment well    Behavior During Therapy WFL for tasks assessed/performed              Past Medical History:  Diagnosis Date   Cancer ([place])    Basal cell carcinoma of the nose.   Gastroparesis    Hyperlipidemia    Hypertension    Osteopenia    Past Surgical History:  Procedure Laterality Date   CATARACT EXTRACTION Bilateral    Patient Active Problem List   Diagnosis Date Noted   Popliteus tendinitis of right lower extremity 11/25/2022   Dyslipidemia, goal to be determined 02/10/2017   Essential hypertension 02/10/2017    PCP: Caleen Jobs, NP  REFERRING PROVIDER: Patrica Duel, MD  REFERRING DIAG: R popliteus strain  THERAPY DIAG:  Acute pain of left knee  Difficulty in walking, not elsewhere classified  Edema of knee  Rationale for Evaluation and Treatment: Rehabilitation  ONSET DATE: 11/13/22  SUBJECTIVE:   SUBJECTIVE STATEMENT:I feel better, have been doing the exercise, using the elliptical at the gym.  Noted that my R foot was wiggling  around because my knee felt unstable so I put orthotic in my shoes for when I am working out. PERTINENT HISTORY: Healthy female. Retired, active .  Injury to L knee in gym PAIN:  Are you having pain? Yes: NPRS scale: 2/10 Pain location: R knee lateral over TFL and post knee Pain description: pain with pressure behind knee and with twisting knee Aggravating factors: twisting Relieving factors: icing, resting  PRECAUTIONS: None  WEIGHT BEARING RESTRICTIONS: No  FALLS:  Has patient fallen in last 6 months? No  LIVING  ENVIRONMENT: Lives with: lives with their spouse Lives in: House/apartment Stairs: No] Has following equipment at home: None  OCCUPATION: retired  PLOF: Independent  PATIENT GOALS: I want to be able to work out , get my knee better  NEXT MD VISIT: non scheduled but to contact them when done here  OBJECTIVE:  Copied from MD note 11/25/22: DIAGNOSTIC FINDINGS: Limited ultrasound: Right knee pain:   No effusion suprapatellar pouch. Normal-appearing quadricep and patellar tendon. Normal-appearing medial joint space. Thickening and hypoechoic change within the popliteus muscle.   Summary: Findings consistent with a strain of the popliteus    PATIENT SURVEYS:  LEFS 49/80 , 61%  COGNITION: Overall cognitive status: Within functional limits for tasks assessed     SENSATION: WFL  EDEMA: 1+ R knee   POSTURE: rounded shoulders  PALPATION: Exquisitely tender medial meniscus site R knee, also in prone over medial hamstring distally, and mid thigh along TFL  LOWER EXTREMITY ROM:  Active ROM Right eval Left eval  Hip flexion    Hip extension    Hip abduction    Hip adduction    Hip internal rotation    Hip external rotation    Knee flexion 128 140  Knee extension    Ankle dorsiflexion    Ankle plantarflexion    Ankle inversion    Ankle eversion     (Blank rows = not tested)  LOWER  EXTREMITY MMT:  MMT Right eval Left eval  Hip flexion 5 5  Hip extension    Hip abduction    Hip adduction    Hip internal rotation    Hip external rotation    Knee flexion 4- 5  Knee extension 4-   Ankle dorsiflexion    Ankle plantarflexion 4-   Ankle inversion    Ankle eversion     (Blank rows = not tested)  LOWER EXTREMITY SPECIAL TESTS:  Posterior drawer R +  FUNCTIONAL TESTS:  6 minute walk test: needs test next visit Unilateral stance: R 20 sec( increased sway noted), L over 30 sec  GAIT: Distance walked: 47' within clinic Assistive device utilized: None Level  of assistance: Complete Independence Comments: decreased arm swing R, slight decrease in stance time on R   TODAY'S TREATMENT:   01/07/23: Therapeutic exercise: progressed with instruction in strengthening ex for patient to perform at the gym, including: Standing R hip SLR, and extension with knee extended with pulley stack 5# 15 reps Standing red theraband penguins, to fatigue, band placed proximal to knees Seated B long arc quads with 15#, limiting full knee extension and monitoring for pain 15 reps Patient with painful and restricted Rom for [employer] position, therapist utilized cross friction massage R TFL and glut medius muscle belly, then instructed in supine piriformis stretch , R foot on knee , improved in sitting after session.                                                                                                                             DATE: 12/31/22    PATIENT EDUCATION:  Education details: POC, goals Person educated: Patient Education method: Customer service manager Education comprehension: verbalized understanding  HOME EXERCISE PROGRAM: Access Code: [email or web address] URL: [email or web address] Date: 01/07/2023 Prepared by: Warren Lacy Anicka Stuckert  Exercises - Supine Bridge  - 1 x daily - 7 x weekly - 3 sets - 10 reps - Seated Piriformis Stretch  - 1 x daily - 7 x weekly - 3 sets - 10 reps - Supine Hip External Rotation Stretch  - 1 x daily - 7 x weekly - 3 sets - 10 reps - Side Stepping with Resistance at Thighs  - 1 x daily - 3 x weekly - 1 sets - 20 reps Access Code: [email or web address] URL: [email or web address] Date: 12/31/2022 Prepared by: Warren Lacy Lilygrace Rodick  Exercises - Supine Bridge  - 1 x daily - 7 x weekly - 3 sets - 10 reps - Seated Long Arc Quad  - 1 x daily - 7 x weekly - 3 sets - 10 reps - Seated Ankle Plantarflexion with Resistance  - 1 x daily - 7 x weekly - 3 sets - 10 reps  ASSESSMENT:  CLINICAL IMPRESSION: Patient is a 72 y.o. female, who  was seen today for physical therapy treatment for R popliteus muscle strain.  Today was her second visit for PT.  She demonstrated improved gait  pattern with less instability noted with R mid stance.  Progressed with more aggressive proximal hip strengthening which she tolerated well. Also R glut medius IT band tightness. Should benefit from ongoing skilled PT. OBJECTIVE IMPAIRMENTS: Abnormal gait, decreased balance, difficulty walking, decreased ROM, decreased strength, and pain.   ACTIVITY LIMITATIONS: carrying, lifting, bending, sitting, standing, and squatting  PARTICIPATION LIMITATIONS: community activity and yard work  PERSONAL FACTORS: 1 comorbidity: osteopenia  are also affecting patient's functional outcome.   REHAB POTENTIAL: Good  CLINICAL DECISION MAKING: Stable/uncomplicated  EVALUATION COMPLEXITY: Low   GOALS: Goals reviewed with patient? Yes  SHORT TERM GOALS: Target date: 01/14/23 I Hep for long term condition management Baseline:initiated today Goal status: IN PROGRESS   LONG TERM GOALS: Target date: 03/25/23  LEFS improve to 60/80 Baseline: 49 Goal status: INITIAL  2.  Improve strength R quads, hamstrings to wfl R LE Baseline: 4-/5 Goal status: INITIAL  3.  Improve unilateral balancing on R LE to 30 sec Baseline: 20 sec  Goal status: INITIAL  4.  6 min walk test without deficits or increased pain R knee Baseline: not assessed, needs assessment in next 2 to 3 visits Goal status: INITIAL  PLAN:  PT FREQUENCY: 1-2x/week  PT DURATION: 12 weeks  PLANNED INTERVENTIONS: Therapeutic exercises, Therapeutic activity, Neuromuscular re-education, Balance training, Gait training, Patient/Family education, Self Care, and Joint mobilization  PLAN FOR NEXT SESSION: eccentric hip extensor and quads strengthening.   Karema Tocci L Adonus Uselman, PT 01/07/2023, 5:58 PM

## 2023-01-14 ENCOUNTER — Ambulatory Visit: Payer: Medicare Other

## 2023-01-14 DIAGNOSIS — R262 Difficulty in walking, not elsewhere classified: Secondary | ICD-10-CM | POA: Diagnosis not present

## 2023-01-14 DIAGNOSIS — M25561 Pain in right knee: Secondary | ICD-10-CM | POA: Diagnosis not present

## 2023-01-14 DIAGNOSIS — M25562 Pain in left knee: Secondary | ICD-10-CM | POA: Diagnosis not present

## 2023-01-14 DIAGNOSIS — M25469 Effusion, unspecified knee: Secondary | ICD-10-CM | POA: Diagnosis not present

## 2023-01-14 DIAGNOSIS — M76891 Other specified enthesopathies of right lower limb, excluding foot: Secondary | ICD-10-CM | POA: Diagnosis not present

## 2023-01-14 NOTE — Therapy (Signed)
OUTPATIENT PHYSICAL THERAPY TREATMENT   Patient Name: [name] MRN: [id number] DOB:[birth year], 72 y.o., female Today's Date: 01/14/2023  END OF SESSION:  PT End of Session - 01/14/23 1443     Visit Number 3    Date for PT Re-Evaluation 03/25/23    Authorization Type UHC Medicare and Tricare for Life    PT Start Time 1444    PT Stop Time 1530    PT Time Calculation (min) 46 min    Activity Tolerance Patient tolerated treatment well    Behavior During Therapy WFL for tasks assessed/performed               Past Medical History:  Diagnosis Date   Cancer ([place])    Basal cell carcinoma of the nose.   Gastroparesis    Hyperlipidemia    Hypertension    Osteopenia    Past Surgical History:  Procedure Laterality Date   CATARACT EXTRACTION Bilateral    Patient Active Problem List   Diagnosis Date Noted   Popliteus tendinitis of right lower extremity 11/25/2022   Dyslipidemia, goal to be determined 02/10/2017   Essential hypertension 02/10/2017    PCP: Caleen Jobs, NP  REFERRING PROVIDER: Patrica Duel, MD  REFERRING DIAG: R popliteus strain  THERAPY DIAG:  Acute pain of left knee  Difficulty in walking, not elsewhere classified  Edema of knee  Rationale for Evaluation and Treatment: Rehabilitation  ONSET DATE: 11/13/22  SUBJECTIVE:   SUBJECTIVE STATEMENT:Notes pain only when having to pivot on R knee or lifting knee to get into tub otherwise  PERTINENT HISTORY: Healthy female. Retired, active .  Injury to L knee in gym PAIN:  Are you having pain? Yes: NPRS scale: 2/10 Pain location: R knee lateral over TFL and post knee Pain description: pain with pressure behind knee and with twisting knee Aggravating factors: twisting Relieving factors: icing, resting  PRECAUTIONS: None  WEIGHT BEARING RESTRICTIONS: No  FALLS:  Has patient fallen in last 6 months? No  LIVING ENVIRONMENT: Lives with: lives with their spouse Lives in:  House/apartment Stairs: No] Has following equipment at home: None  OCCUPATION: retired  PLOF: Independent  PATIENT GOALS: I want to be able to work out , get my knee better  NEXT MD VISIT: non scheduled but to contact them when done here  OBJECTIVE:  Copied from MD note 11/25/22: DIAGNOSTIC FINDINGS: Limited ultrasound: Right knee pain:   No effusion suprapatellar pouch. Normal-appearing quadricep and patellar tendon. Normal-appearing medial joint space. Thickening and hypoechoic change within the popliteus muscle.   Summary: Findings consistent with a strain of the popliteus    PATIENT SURVEYS:  LEFS 49/80 , 61%  COGNITION: Overall cognitive status: Within functional limits for tasks assessed     SENSATION: WFL  EDEMA: 1+ R knee   POSTURE: rounded shoulders  PALPATION: Exquisitely tender medial meniscus site R knee, also in prone over medial hamstring distally, and mid thigh along TFL  LOWER EXTREMITY ROM:  Active ROM Right eval Left eval  Hip flexion    Hip extension    Hip abduction    Hip adduction    Hip internal rotation    Hip external rotation    Knee flexion 128 140  Knee extension    Ankle dorsiflexion    Ankle plantarflexion    Ankle inversion    Ankle eversion     (Blank rows = not tested)  LOWER EXTREMITY MMT:  MMT Right eval Left eval  Hip flexion 5 5  Hip extension    Hip abduction    Hip adduction    Hip internal rotation    Hip external rotation    Knee flexion 4- 5  Knee extension 4-   Ankle dorsiflexion    Ankle plantarflexion 4-   Ankle inversion    Ankle eversion     (Blank rows = not tested)  LOWER EXTREMITY SPECIAL TESTS:  Posterior drawer R +  FUNCTIONAL TESTS:  6 minute walk test: needs test next visit Unilateral stance: R 20 sec( increased sway noted), L over 30 sec  GAIT: Distance walked: 21' within clinic Assistive device utilized: None Level of assistance: Complete Independence Comments: decreased arm  swing R, slight decrease in stance time on R   TODAY'S TREATMENT:   01/14/23 Therapeutic Exercise: to improve strength and mobility.  Demo, verbal and tactile cues throughout for technique.  Bike L1x61min Standing hip extension RTB x 10 bil Standing hip abduction RTB x 10 bil Side steps RTB 76ft RTB at ankles x 10  Seated R quad and hamstring isometrics 10x5" Supine R SLR 2x10 S/L R/L clamshells RTB 2x10 Fwd and lateral step down with LLE heel tap   01/07/23: Therapeutic exercise: progressed with instruction in strengthening ex for patient to perform at the gym, including: Standing R hip SLR, and extension with knee extended with pulley stack 5# 15 reps Standing red theraband penguins, to fatigue, band placed proximal to knees Seated B long arc quads with 15#, limiting full knee extension and monitoring for pain 15 reps Patient with painful and restricted Rom for [employer] position, therapist utilized cross friction massage R TFL and glut medius muscle belly, then instructed in supine piriformis stretch , R foot on knee , improved in sitting after session.                                                                                                                             DATE: 12/31/22    PATIENT EDUCATION:  Education details: POC, goals Person educated: Patient Education method: Customer service manager Education comprehension: verbalized understanding  HOME EXERCISE PROGRAM: Access Code: [email or web address] URL: [email or web address] Date: 01/07/2023 Prepared by: Warren Lacy Speaks  Exercises - Supine Bridge  - 1 x daily - 7 x weekly - 3 sets - 10 reps - Seated Piriformis Stretch  - 1 x daily - 7 x weekly - 3 sets - 10 reps - Supine Hip External Rotation Stretch  - 1 x daily - 7 x weekly - 3 sets - 10 reps - Side Stepping with Resistance at Thighs  - 1 x daily - 3 x weekly - 1 sets - 20 reps Access Code: [email or web address] URL: [email or web address] Date:  12/31/2022 Prepared by: Hayden Pedro  Exercises - Supine Bridge  - 1 x daily - 7 x weekly - 3 sets - 10 reps - Seated Long Arc Quad  - 1 x daily - 7 x weekly - 3 sets - 10 reps -  Seated Ankle Plantarflexion with Resistance  - 1 x daily - 7 x weekly - 3 sets - 10 reps  ASSESSMENT:  CLINICAL IMPRESSION: Patient is a 72 y.o. female, who was seen today for physical therapy treatment for R popliteus muscle strain.  Continued with progression of proximal LE strengthening. Pt was unable to do R hip abduction in side lying d/t some pain in the lateral hip but otherwise no limitations with interventions. She was able to do more eccentric quad and glute strengthening today with step downs. OBJECTIVE IMPAIRMENTS: Abnormal gait, decreased balance, difficulty walking, decreased ROM, decreased strength, and pain.   ACTIVITY LIMITATIONS: carrying, lifting, bending, sitting, standing, and squatting  PARTICIPATION LIMITATIONS: community activity and yard work  PERSONAL FACTORS: 1 comorbidity: osteopenia  are also affecting patient's functional outcome.   REHAB POTENTIAL: Good  CLINICAL DECISION MAKING: Stable/uncomplicated  EVALUATION COMPLEXITY: Low   GOALS: Goals reviewed with patient? Yes  SHORT TERM GOALS: Target date: 01/14/23 I Hep for long term condition management Baseline:initiated today Goal status: MET- 01/14/23   LONG TERM GOALS: Target date: 03/25/23  LEFS improve to 60/80 Baseline: 49 Goal status: INITIAL  2.  Improve strength R quads, hamstrings to wfl R LE Baseline: 4-/5 Goal status: INITIAL  3.  Improve unilateral balancing on R LE to 30 sec Baseline: 20 sec  Goal status: INITIAL  4.  6 min walk test without deficits or increased pain R knee Baseline: not assessed, needs assessment in next 2 to 3 visits Goal status: INITIAL  PLAN:  PT FREQUENCY: 1-2x/week  PT DURATION: 12 weeks  PLANNED INTERVENTIONS: Therapeutic exercises, Therapeutic activity, Neuromuscular  re-education, Balance training, Gait training, Patient/Family education, Self Care, and Joint mobilization  PLAN FOR NEXT SESSION: eccentric hip extensor and quads strengthening (did well with step downs).   Artist Pais, PTA 01/14/2023, 3:31 PM

## 2023-01-16 ENCOUNTER — Ambulatory Visit: Payer: Medicare Other

## 2023-01-16 ENCOUNTER — Other Ambulatory Visit: Payer: Self-pay

## 2023-01-16 DIAGNOSIS — M25561 Pain in right knee: Secondary | ICD-10-CM | POA: Diagnosis not present

## 2023-01-16 DIAGNOSIS — R262 Difficulty in walking, not elsewhere classified: Secondary | ICD-10-CM | POA: Diagnosis not present

## 2023-01-16 DIAGNOSIS — M25469 Effusion, unspecified knee: Secondary | ICD-10-CM | POA: Diagnosis not present

## 2023-01-16 DIAGNOSIS — M25562 Pain in left knee: Secondary | ICD-10-CM | POA: Diagnosis not present

## 2023-01-16 DIAGNOSIS — M76891 Other specified enthesopathies of right lower limb, excluding foot: Secondary | ICD-10-CM | POA: Diagnosis not present

## 2023-01-16 NOTE — Therapy (Signed)
OUTPATIENT PHYSICAL THERAPY TREATMENT   Patient Name: [name] MRN: [id number] DOB:04-15-1951, 72 y.o., female Today's Date: 01/16/2023  END OF SESSION:  PT End of Session - 01/16/23 1400     Visit Number 4    Date for PT Re-Evaluation 03/25/23    Authorization Type UHC Medicare and Tricare for Life    PT Start Time 1401    PT Stop Time 1445    PT Time Calculation (min) 44 min    Activity Tolerance Patient tolerated treatment well    Behavior During Therapy WFL for tasks assessed/performed               Past Medical History:  Diagnosis Date   Cancer ([place])    Basal cell carcinoma of the nose.   Gastroparesis    Hyperlipidemia    Hypertension    Osteopenia    Past Surgical History:  Procedure Laterality Date   CATARACT EXTRACTION Bilateral    Patient Active Problem List   Diagnosis Date Noted   Popliteus tendinitis of right lower extremity 11/25/2022   Dyslipidemia, goal to be determined 02/10/2017   Essential hypertension 02/10/2017    PCP: Caleen Jobs, NP  REFERRING PROVIDER: Patrica Duel, MD  REFERRING DIAG: R popliteus strain  THERAPY DIAG:  Difficulty in walking, not elsewhere classified  Edema of knee  Acute pain of right knee  Rationale for Evaluation and Treatment: Rehabilitation  ONSET DATE: 11/13/22  SUBJECTIVE:   SUBJECTIVE STATEMENT:Notes pain only when having to pivot on R knee or lifting knee to get into tub otherwise  PERTINENT HISTORY: Healthy female. Retired, active .  Injury to L knee in gym PAIN:  Are you having pain? Yes: NPRS scale: 2/10 Pain location: R knee lateral over TFL and post knee Pain description: pain with pressure behind knee and with twisting knee Aggravating factors: twisting Relieving factors: icing, resting  PRECAUTIONS: None  WEIGHT BEARING RESTRICTIONS: No  FALLS:  Has patient fallen in last 6 months? No  LIVING ENVIRONMENT: Lives with: lives with their spouse Lives in:  House/apartment Stairs: No] Has following equipment at home: None  OCCUPATION: retired  PLOF: Independent  PATIENT GOALS: I want to be able to work out , get my knee better  NEXT MD VISIT: non scheduled but to contact them when done here  OBJECTIVE:  Copied from MD note 11/25/22: DIAGNOSTIC FINDINGS: Limited ultrasound: Right knee pain:   No effusion suprapatellar pouch. Normal-appearing quadricep and patellar tendon. Normal-appearing medial joint space. Thickening and hypoechoic change within the popliteus muscle.   Summary: Findings consistent with a strain of the popliteus    PATIENT SURVEYS:  LEFS 49/80 , 61%  COGNITION: Overall cognitive status: Within functional limits for tasks assessed     SENSATION: WFL  EDEMA: 1+ R knee   POSTURE: rounded shoulders  PALPATION: Exquisitely tender medial meniscus site R knee, also in prone over medial hamstring distally, and mid thigh along TFL  LOWER EXTREMITY ROM:  Active ROM Right eval Left eval  Hip flexion    Hip extension    Hip abduction    Hip adduction    Hip internal rotation    Hip external rotation    Knee flexion 128 140  Knee extension    Ankle dorsiflexion    Ankle plantarflexion    Ankle inversion    Ankle eversion     (Blank rows = not tested)  LOWER EXTREMITY MMT:  MMT Right eval Left eval  Hip flexion 5 5  Hip extension    Hip abduction    Hip adduction    Hip internal rotation    Hip external rotation    Knee flexion 4- 5  Knee extension 4-   Ankle dorsiflexion    Ankle plantarflexion 4-   Ankle inversion    Ankle eversion     (Blank rows = not tested)  LOWER EXTREMITY SPECIAL TESTS:  Posterior drawer R +  FUNCTIONAL TESTS:  6 minute walk test: needs test next visit Unilateral stance: R 20 sec( increased sway noted), L over 30 sec  GAIT: Distance walked: 44' within clinic Assistive device utilized: None Level of assistance: Complete Independence Comments: decreased arm  swing R, slight decrease in stance time on R   TODAY'S TREATMENT:  01/16/23: Therapeutic exercise: Bike level 3: 6 min Standing hip extension RTB x 10 bil Standing hip abduction RTB x 10 bil Side steps RTB 40ft RTB at ankles x 2 sets Seated B knee ext 25#,15 x 2 Fwd and lateral step down with LLE heel tap  15x Standing SLR 2# cuff weight 15x 2 sets Seated R LE hamstring curls green band    01/14/23 Therapeutic Exercise: to improve strength and mobility.  Demo, verbal and tactile cues throughout for technique.  Bike L1x32min Standing hip extension RTB x 10 bil Standing hip abduction RTB x 10 bil Side steps RTB 23ft RTB at ankles x 10  Seated R quad and hamstring isometrics 10x5" Supine R SLR 2x10 S/L R/L clamshells RTB 2x10 Fwd and lateral step down with LLE heel tap   01/07/23: Therapeutic exercise: progressed with instruction in strengthening ex for patient to perform at the gym, including: Standing R hip SLR, and extension with knee extended with pulley stack 5# 15 reps Standing red theraband penguins, to fatigue, band placed proximal to knees Seated B long arc quads with 15#, limiting full knee extension and monitoring for pain 15 reps Patient with painful and restricted Rom for [employer] position, therapist utilized cross friction massage R TFL and glut medius muscle belly, then instructed in supine piriformis stretch , R foot on knee , improved in sitting after session.                                                                                                                             DATE: 12/31/22    PATIENT EDUCATION:  Education details: POC, goals Person educated: Patient Education method: Customer service manager Education comprehension: verbalized understanding  HOME EXERCISE PROGRAM: Access Code: [email or web address] URL: [email or web address] Date: 01/07/2023 Prepared by: Warren Lacy Donnajean Chesnut  Exercises - Supine Bridge  - 1 x daily - 7 x weekly - 3 sets - 10  reps - Seated Piriformis Stretch  - 1 x daily - 7 x weekly - 3 sets - 10 reps - Supine Hip External Rotation Stretch  - 1 x daily - 7 x weekly - 3 sets - 10 reps - Side Stepping with Resistance at Thighs  -  1 x daily - 3 x weekly - 1 sets - 20 reps Access Code: [email or web address] URL: [email or web address] Date: 12/31/2022 Prepared by: Warren Lacy Raizy Auzenne  Exercises - Supine Bridge  - 1 x daily - 7 x weekly - 3 sets - 10 reps - Seated Long Arc Quad  - 1 x daily - 7 x weekly - 3 sets - 10 reps - Seated Ankle Plantarflexion with Resistance  - 1 x daily - 7 x weekly - 3 sets - 10 reps  ASSESSMENT:  CLINICAL IMPRESSION: Patient is a 72 y.o. female, who was seen today for physical therapy treatment for R popliteus muscle strain.  Overall much improved, less tenderness with palpation of R medial knee jt line, also able to tolerate some isolated R knee flexion strengthening.  Able to place R foot on knee in a tailor position without pain in r knee  now. R knee without effusion today as well .  She will attend once next week, plans to return to light exercise at her gym soon. Will assess again next visit regarding ongoing skilled PT , spacing out the visits, and/ or dc. OBJECTIVE IMPAIRMENTS: Abnormal gait, decreased balance, difficulty walking, decreased ROM, decreased strength, and pain.   ACTIVITY LIMITATIONS: carrying, lifting, bending, sitting, standing, and squatting  PARTICIPATION LIMITATIONS: community activity and yard work  PERSONAL FACTORS: 1 comorbidity: osteopenia  are also affecting patient's functional outcome.   REHAB POTENTIAL: Good  CLINICAL DECISION MAKING: Stable/uncomplicated  EVALUATION COMPLEXITY: Low   GOALS: Goals reviewed with patient? Yes  SHORT TERM GOALS: Target date: 01/14/23 I Hep for long term condition management Baseline:initiated today Goal status: MET- 01/14/23   LONG TERM GOALS: Target date: 03/25/23  LEFS improve to 60/80 Baseline: 49 Goal status: IN  PROGRESS  2.  Improve strength R quads, hamstrings to wfl R LE Baseline: 4-/5 Goal status: IN PROGRESS  3.  Improve unilateral balancing on R LE to 30 sec Baseline: 20 sec  Goal status: IN PROGRESS  4.  6 min walk test without deficits or increased pain R knee Baseline: not assessed, needs assessment in next 2 to 3 visits Goal status: IN PROGRESS  PLAN:  PT FREQUENCY: 1-2x/week  PT DURATION: 12 weeks  PLANNED INTERVENTIONS: Therapeutic exercises, Therapeutic activity, Neuromuscular re-education, Balance training, Gait training, Patient/Family education, Self Care, and Joint mobilization  PLAN FOR NEXT SESSION: eccentric hip extensor and quads strengthening , reassess hamstring and begin hamstring/knee flexion strengthening.    Estelle Skibicki L Braylon Grenda, PT 01/16/2023, 3:00 PM

## 2023-01-21 ENCOUNTER — Telehealth: Payer: Self-pay | Admitting: Family Medicine

## 2023-01-21 NOTE — Telephone Encounter (Signed)
Contacted Dell[facility] [place] to schedule their annual wellness visit. Appointment made for 01/22/2023.  Sherol Dade; Care Guide Ambulatory [facility] Direct Dial: [phone]

## 2023-01-22 ENCOUNTER — Ambulatory Visit (INDEPENDENT_AMBULATORY_CARE_PROVIDER_SITE_OTHER): Payer: Medicare Other | Admitting: *Deleted

## 2023-01-22 DIAGNOSIS — Z0000 Encounter for general adult medical examination without abnormal findings: Secondary | ICD-10-CM | POA: Diagnosis not present

## 2023-01-22 NOTE — Progress Notes (Signed)
Subjective:  Pt completed ADLs, Fall risk, and SDOH during e-check in on 01/21/23.  Answers verified with pt.    [name] is a 72 y.o. female who presents for an Initial Medicare Annual Wellness Visit.  I connected with  [name] on 01/22/23 by a audio enabled telemedicine application and verified that I am speaking with the correct person using two identifiers.  Patient Location: Home  Provider Location: Office/Clinic  I discussed the limitations of evaluation and management by telemedicine. The patient expressed understanding and agreed to proceed.   Review of Systems     Cardiac Risk Factors include: advanced age (>74men, >80 women);dyslipidemia;hypertension     Objective:    There were no vitals filed for this visit. There is no height or weight on file to calculate BMI.     01/22/2023    8:59 AM 12/31/2022    5:37 PM 09/09/2017    2:10 PM  Advanced Directives  Does Patient Have a Medical Advance Directive? No No;Yes No  Type of Advance Directive  Healthcare Power of Attorney   Does patient want to make changes to medical advance directive?  No - Patient declined   Would patient like information on creating a medical advance directive? No - Patient declined No - Patient declined Yes (MAU/Ambulatory/Procedural Areas - Information given)    Current Medications (verified) Outpatient Encounter Medications as of 01/22/2023  Medication Sig   aspirin EC 81 MG tablet Take 1 tablet (81 mg total) by mouth daily.   ezetimibe (ZETIA) 10 MG tablet Take 1 tablet by mouth daily.   lisinopril-hydrochlorothiazide (PRINZIDE,ZESTORETIC) 10-12.5 MG tablet 1 TABLET ONCE A DAY ORALLY 90 DAYS   rosuvastatin (CRESTOR) 5 MG tablet Take 1 tablet (5 mg total) by mouth daily.   No facility-administered encounter medications on file as of 01/22/2023.    Allergies (verified) Celebrex [celecoxib], Elemental sulfur, Flagyl [metronidazole], and Reglan [metoclopramide]    History: Past Medical History:  Diagnosis Date   Cancer ([place])    Basal cell carcinoma of the nose.   Gastroparesis    Hyperlipidemia    Hypertension    Osteopenia    Past Surgical History:  Procedure Laterality Date   CATARACT EXTRACTION Bilateral    Family History  Problem Relation Age of Onset   Hypertension Mother    COPD Father    Social History   Socioeconomic History   Marital status: Divorced    Spouse name: Not on file   Number of children: Not on file   Years of education: Not on file   Highest education level: Not on file  Occupational History   Not on file  Tobacco Use   Smoking status: Never   Smokeless tobacco: Never  Substance and Sexual Activity   Alcohol use: Not on file   Drug use: Not on file   Sexual activity: Not on file  Other Topics Concern   Not on file  Social History Narrative   Not on file   Social Determinants of Health   Financial Resource Strain: Medium Risk (01/21/2023)   Overall Financial Resource Strain (CARDIA)    Difficulty of Paying Living Expenses: Somewhat hard  Food Insecurity: No Food Insecurity (01/21/2023)   Hunger Vital Sign    Worried About Running Out of Food in the Last Year: Never true    Ran Out of Food in the Last Year: Never true  Transportation Needs: No Transportation Needs (01/21/2023)   PRAPARE - Transportation  Lack of Transportation (Medical): No    Lack of Transportation (Non-Medical): No  Physical Activity: Insufficiently Active (01/21/2023)   Exercise Vital Sign    Days of Exercise per Week: 3 days    Minutes of Exercise per Session: 40 min  Stress: No Stress Concern Present (01/21/2023)   [place]    Feeling of Stress : Not at all  Social Connections: Unknown (01/21/2023)   Social Connection and Isolation Panel [NHANES]    Frequency of Communication with Friends and Family: Three times a week    Frequency of Social Gatherings with  Friends and Family: Three times a week    Attends Religious Services: Patient declined    Active Member of Clubs or Organizations: No    Attends Archivist Meetings: Never    Marital Status: Divorced    Tobacco Counseling Counseling given: Not Answered   Clinical Intake:  Pre-visit preparation completed: Yes  Pain : No/denies pain  Nutritional Risks: None Diabetes: No  How often do you need to have someone help you when you read instructions, pamphlets, or other written materials from your doctor or pharmacy?: 1 - Never   Activities of Daily Living    01/21/2023   11:33 AM  In your present state of health, do you have any difficulty performing the following activities:  Hearing? 0  Vision? 0  Difficulty concentrating or making decisions? 0  Walking or climbing stairs? 0  Dressing or bathing? 0  Doing errands, shopping? 0  Preparing Food and eating ? N  Using the Toilet? N  In the past six months, have you accidently leaked urine? N  Do you have problems with loss of bowel control? N  Managing your Medications? N  Managing your Finances? N  Housekeeping or managing your Housekeeping? N    Patient Care Team: Terrilyn Saver, NP as PCP - General (Family Medicine) Gerri Spore, MD as Referring Physician (Geriatric Medicine)  Indicate any recent Medical Services you may have received from other than [facility] providers in the past year (date may be approximate).     Assessment:   This is a routine wellness examination for [name].  Hearing/Vision screen No results found.  Dietary issues and exercise activities discussed: Current Exercise Habits: Home exercise routine, Type of exercise: Other - see comments;strength training/weights;treadmill (eliptical), Time (Minutes): 40, Frequency (Times/Week): 3, Weekly Exercise (Minutes/Week): 120, Intensity: Mild, Exercise limited by: None identified   Goals Addressed   None    Depression Screen    01/22/2023    9:00  AM 12/19/2022    8:57 AM 11/20/2022    1:40 PM  PHQ 2/9 Scores  PHQ - 2 Score 0 0 0  PHQ- 9 Score  0 1    Fall Risk    01/21/2023   11:33 AM 12/19/2022    8:57 AM  Fall Risk   Falls in the past year? 0 0  Number falls in past yr: 0 0  Injury with Fall? 0 0  Risk for fall due to : No Fall Risks No Fall Risks  Follow up Falls evaluation completed Falls evaluation completed    [place]:  Any stairs in or around the home? No  Home free of loose throw rugs in walkways, pet beds, electrical cords, etc? Yes  Adequate lighting in your home to reduce risk of falls? Yes   ASSISTIVE DEVICES UTILIZED TO PREVENT FALLS:  Life alert?  No  Use of a cane, walker or w/c? No  Grab bars in the bathroom? No  Shower chair or bench in shower? No  Elevated toilet seat or a handicapped toilet? No   TIMED UP AND GO:  Was the test performed?  No, audio visit .    Cognitive Function:        01/22/2023    9:04 AM  6CIT Screen  What Year? 0 points  What month? 0 points  What time? 0 points  Count back from 20 0 points  Months in reverse 0 points  Repeat phrase 0 points  Total Score 0 points    Immunizations Immunization History  Administered Date(s) Administered   COVID-19, mRNA, vaccine(Comirnaty)12 years and older 08/07/2022   Influenza, High Dose Seasonal PF 08/01/2020   PFIZER Comirnaty(Gray Top)Covid-19 Tri-Sucrose Vaccine 05/14/2021   PFIZER(Purple Top)SARS-COV-2 Vaccination 11/09/2019, 11/30/2019   Pfizer Covid-19 Vaccine Bivalent Booster 5yrs & up 07/12/2021   Pneumococcal Conjugate-13 07/31/2015   Pneumococcal Polysaccharide-23 07/31/2016   Td 05/30/2006   Tdap 07/31/2016   Zoster Recombinat (Shingrix) 08/17/2018, 12/15/2018   Zoster, Live 07/15/2011    TDAP status: Up to date  Flu Vaccine status: Up to date  Pneumococcal vaccine status: Up to date  Covid-19 vaccine status: Information provided on how to obtain vaccines.    Qualifies for Shingles Vaccine? Yes   Zostavax completed Yes   Shingrix Completed?: Yes  Screening Tests Health Maintenance  Topic Date Due   Medicare Annual Wellness (AWV)  Never done   COVID-19 Vaccine (6 - 2023-24 season) 10/02/2022   INFLUENZA VACCINE  05/22/2023   MAMMOGRAM  08/07/2024   COLONOSCOPY (Pts 45-16yrs Insurance coverage will need to be confirmed)  08/19/2025   DTaP/Tdap/Td (3 - Td or Tdap) 07/31/2026   Pneumonia Vaccine 38+ Years old  Completed   DEXA SCAN  Completed   Hepatitis C Screening  Completed   Zoster Vaccines- Shingrix  Completed   HPV VACCINES  Aged Out    Health Maintenance  Health Maintenance Due  Topic Date Due   Medicare Annual Wellness (AWV)  Never done   COVID-19 Vaccine (6 - 2023-24 season) 10/02/2022    Colorectal cancer screening: Type of screening: Colonoscopy. Completed 08/19/22. Repeat every 3 years  Mammogram status: Completed 08/07/22. Repeat every year  Bone Density status: Completed 07/12/20. Results reflect: Bone density results: NORMAL. Repeat every 2 years.  Lung Cancer Screening: (Low Dose CT Chest recommended if Age 32-80 years, 30 pack-year currently smoking OR have quit w/in 15years.) does not qualify.   Additional Screening:  Hepatitis C Screening: does qualify; Completed 12/19/22  Vision Screening: Recommended annual ophthalmology exams for early detection of glaucoma and other disorders of the eye. Is the patient up to date with their annual eye exam?  Yes  Who is the provider or what is the name of the office in which the patient attends annual eye exams? Dr. Deatra Ina If pt is not established with a provider, would they like to be referred to a provider to establish care? No .   Dental Screening: Recommended annual dental exams for proper oral hygiene  Community Resource Referral / Chronic Care Management: CRR required this visit?  No   CCM required this visit?  No      Plan:     I have personally reviewed  and noted the following in the patient's chart:   Medical and social history Use of alcohol, tobacco or illicit drugs  Current medications and supplements including  opioid prescriptions. Patient is not currently taking opioid prescriptions. Functional ability and status Nutritional status Physical activity Advanced directives List of other physicians Hospitalizations, surgeries, and ER visits in previous 12 months Vitals Screenings to include cognitive, depression, and falls Referrals and appointments  In addition, I have reviewed and discussed with patient certain preventive protocols, quality metrics, and best practice recommendations. A written personalized care plan for preventive services as well as general preventive health recommendations were provided to patient.   Due to this being a telephonic visit, the after visit summary with patients personalized plan was offered to patient via mail or my-chart. Patient would like to access on my-chart.  Beatris Ship, [place]   01/22/2023   Nurse Notes: None

## 2023-01-22 NOTE — Patient Instructions (Signed)
[name] , Thank you for taking time to come for your Medicare Wellness Visit. I appreciate your ongoing commitment to your health goals. Please review the following plan we discussed and let me know if I can assist you in the future.   These are the goals we discussed:  Goals   None     This is a list of the screening recommended for you and due dates:  Health Maintenance  Topic Date Due   COVID-19 Vaccine (6 - 2023-24 season) 10/02/2022   Flu Shot  05/22/2023   Medicare Annual Wellness Visit  01/22/2024   Mammogram  08/07/2024   Colon Cancer Screening  08/19/2025   DTaP/Tdap/Td vaccine (3 - Td or Tdap) 07/31/2026   Pneumonia Vaccine  Completed   DEXA scan (bone density measurement)  Completed   Hepatitis C Screening: USPSTF Recommendation to screen - Ages 30-79 yo.  Completed   Zoster (Shingles) Vaccine  Completed   HPV Vaccine  Aged Out     Next appointment: Follow up in one year for your annual wellness visit.   Preventive Care 9 Years and Older, Female Preventive care refers to lifestyle choices and visits with your health care provider that can promote health and wellness. What does preventive care include? A yearly physical exam. This is also called an annual well check. Dental exams once or twice a year. Routine eye exams. Ask your health care provider how often you should have your eyes checked. Personal lifestyle choices, including: Daily care of your teeth and gums. Regular physical activity. Eating a healthy diet. Avoiding tobacco and drug use. Limiting alcohol use. Practicing safe sex. Taking low-dose aspirin every day. Taking vitamin and mineral supplements as recommended by your health care provider. What happens during an annual well check? The services and screenings done by your health care provider during your annual well check will depend on your age, overall health, lifestyle risk factors, and family history of disease. Counseling  Your health  care provider may ask you questions about your: Alcohol use. Tobacco use. Drug use. Emotional well-being. Home and relationship well-being. Sexual activity. Eating habits. History of falls. Memory and ability to understand (cognition). Work and work Statistician. Reproductive health. Screening  You may have the following tests or measurements: Height, weight, and BMI. Blood pressure. Lipid and cholesterol levels. These may be checked every 5 years, or more frequently if you are over 21 years old. Skin check. Lung cancer screening. You may have this screening every year starting at age 41 if you have a 30-pack-year history of smoking and currently smoke or have quit within the past 15 years. Fecal occult blood test (FOBT) of the stool. You may have this test every year starting at age 38. Flexible sigmoidoscopy or colonoscopy. You may have a sigmoidoscopy every 5 years or a colonoscopy every 10 years starting at age 10. Hepatitis C blood test. Hepatitis B blood test. Sexually transmitted disease (STD) testing. Diabetes screening. This is done by checking your blood sugar (glucose) after you have not eaten for a while (fasting). You may have this done every 1-3 years. Bone density scan. This is done to screen for osteoporosis. You may have this done starting at age 31. Mammogram. This may be done every 1-2 years. Talk to your health care provider about how often you should have regular mammograms. Talk with your health care provider about your test results, treatment options, and if necessary, the need for more tests. Vaccines  Your health care  provider may recommend certain vaccines, such as: Influenza vaccine. This is recommended every year. Tetanus, diphtheria, and acellular pertussis (Tdap, Td) vaccine. You may need a Td booster every 10 years. Zoster vaccine. You may need this after age 18. Pneumococcal 13-valent conjugate (PCV13) vaccine. One dose is recommended after age  76. Pneumococcal polysaccharide (PPSV23) vaccine. One dose is recommended after age 31. Talk to your health care provider about which screenings and vaccines you need and how often you need them. This information is not intended to replace advice given to you by your health care provider. Make sure you discuss any questions you have with your health care provider. Document Released: 11/03/2015 Document Revised: 06/26/2016 Document Reviewed: 08/08/2015 Elsevier Interactive Patient Education  2017 [place] Prevention in the Home Falls can cause injuries. They can happen to people of all ages. There are many things you can do to make your home safe and to help prevent falls. What can I do on the outside of my home? Regularly fix the edges of walkways and driveways and fix any cracks. Remove anything that might make you trip as you walk through a door, such as a raised step or threshold. Trim any bushes or trees on the path to your home. Use bright outdoor lighting. Clear any walking paths of anything that might make someone trip, such as rocks or tools. Regularly check to see if handrails are loose or broken. Make sure that both sides of any steps have handrails. Any raised decks and porches should have guardrails on the edges. Have any leaves, snow, or ice cleared regularly. Use sand or salt on walking paths during winter. Clean up any spills in your garage right away. This includes oil or grease spills. What can I do in the bathroom? Use night lights. Install grab bars by the toilet and in the tub and shower. Do not use towel bars as grab bars. Use non-skid mats or decals in the tub or shower. If you need to sit down in the shower, use a plastic, non-slip stool. Keep the floor dry. Clean up any water that spills on the floor as soon as it happens. Remove soap buildup in the tub or shower regularly. Attach bath mats securely with double-sided non-slip rug tape. Do not have throw  rugs and other things on the floor that can make you trip. What can I do in the bedroom? Use night lights. Make sure that you have a light by your bed that is easy to reach. Do not use any sheets or blankets that are too big for your bed. They should not hang down onto the floor. Have a firm chair that has side arms. You can use this for support while you get dressed. Do not have throw rugs and other things on the floor that can make you trip. What can I do in the kitchen? Clean up any spills right away. Avoid walking on wet floors. Keep items that you use a lot in easy-to-reach places. If you need to reach something above you, use a strong step stool that has a grab bar. Keep electrical cords out of the way. Do not use floor polish or wax that makes floors slippery. If you must use wax, use non-skid floor wax. Do not have throw rugs and other things on the floor that can make you trip. What can I do with my stairs? Do not leave any items on the stairs. Make sure that there are handrails on both  sides of the stairs and use them. Fix handrails that are broken or loose. Make sure that handrails are as long as the stairways. Check any carpeting to make sure that it is firmly attached to the stairs. Fix any carpet that is loose or worn. Avoid having throw rugs at the top or bottom of the stairs. If you do have throw rugs, attach them to the floor with carpet tape. Make sure that you have a light switch at the top of the stairs and the bottom of the stairs. If you do not have them, ask someone to add them for you. What else can I do to help prevent falls? Wear shoes that: Do not have high heels. Have rubber bottoms. Are comfortable and fit you well. Are closed at the toe. Do not wear sandals. If you use a stepladder: Make sure that it is fully opened. Do not climb a closed stepladder. Make sure that both sides of the stepladder are locked into place. Ask someone to hold it for you, if  possible. Clearly mark and make sure that you can see: Any grab bars or handrails. First and last steps. Where the edge of each step is. Use tools that help you move around (mobility aids) if they are needed. These include: Canes. Walkers. Scooters. Crutches. Turn on the lights when you go into a dark area. Replace any light bulbs as soon as they burn out. Set up your furniture so you have a clear path. Avoid moving your furniture around. If any of your floors are uneven, fix them. If there are any pets around you, be aware of where they are. Review your medicines with your doctor. Some medicines can make you feel dizzy. This can increase your chance of falling. Ask your doctor what other things that you can do to help prevent falls. This information is not intended to replace advice given to you by your health care provider. Make sure you discuss any questions you have with your health care provider. Document Released: 08/03/2009 Document Revised: 03/14/2016 Document Reviewed: 11/11/2014 Elsevier Interactive Patient Education  2017 [employer].

## 2023-01-23 ENCOUNTER — Other Ambulatory Visit: Payer: Self-pay

## 2023-01-23 ENCOUNTER — Ambulatory Visit: Payer: Medicare Other | Attending: Family Medicine

## 2023-01-23 DIAGNOSIS — R262 Difficulty in walking, not elsewhere classified: Secondary | ICD-10-CM

## 2023-01-23 DIAGNOSIS — M25469 Effusion, unspecified knee: Secondary | ICD-10-CM | POA: Diagnosis not present

## 2023-01-23 DIAGNOSIS — M25562 Pain in left knee: Secondary | ICD-10-CM

## 2023-01-23 DIAGNOSIS — M25561 Pain in right knee: Secondary | ICD-10-CM

## 2023-01-23 NOTE — Therapy (Signed)
OUTPATIENT PHYSICAL THERAPY TREATMENT   Patient Name: [name] MRN: [id number] DOB:07/05/1951, 72 y.o., female Today's Date: 01/23/2023  END OF SESSION:  PT End of Session - 01/23/23 1358     Visit Number 5    Date for PT Re-Evaluation 03/25/23    Authorization Type UHC Medicare and Tricare for Life    PT Start Time 1400    PT Stop Time 1445    PT Time Calculation (min) 45 min    Activity Tolerance Patient tolerated treatment well    Behavior During Therapy WFL for tasks assessed/performed               Past Medical History:  Diagnosis Date   Cancer    Basal cell carcinoma of the nose.   Gastroparesis    Hyperlipidemia    Hypertension    Osteopenia    Past Surgical History:  Procedure Laterality Date   CATARACT EXTRACTION Bilateral    Patient Active Problem List   Diagnosis Date Noted   Popliteus tendinitis of right lower extremity 11/25/2022   Dyslipidemia, goal to be determined 02/10/2017   Essential hypertension 02/10/2017    PCP: Caleen Jobs, NP  REFERRING PROVIDER: Patrica Duel, MD  REFERRING DIAG: R popliteus strain  THERAPY DIAG:  Difficulty in walking, not elsewhere classified  Edema of knee  Acute pain of left knee  Acute pain of right knee  Rationale for Evaluation and Treatment: Rehabilitation  ONSET DATE: 11/13/22  SUBJECTIVE:   SUBJECTIVE STATEMENT:Notes pain only when having to pivot on R knee or lifting knee to get into tub otherwise  PERTINENT HISTORY: Healthy female. Retired, active .  Injury to L knee in gym PAIN:  Are you having pain? Yes: NPRS scale: 2/10 Pain location: R knee lateral over TFL and post knee Pain description: pain with pressure behind knee and with twisting knee Aggravating factors: twisting Relieving factors: icing, resting  PRECAUTIONS: None  WEIGHT BEARING RESTRICTIONS: No  FALLS:  Has patient fallen in last 6 months? No  LIVING ENVIRONMENT: Lives with: lives with their spouse Lives  in: House/apartment Stairs: No] Has following equipment at home: None  OCCUPATION: retired  PLOF: Independent  PATIENT GOALS: I want to be able to work out , get my knee better  NEXT MD VISIT: non scheduled but to contact them when done here  OBJECTIVE:  Copied from MD note 11/25/22: DIAGNOSTIC FINDINGS: Limited ultrasound: Right knee pain:   No effusion suprapatellar pouch. Normal-appearing quadricep and patellar tendon. Normal-appearing medial joint space. Thickening and hypoechoic change within the popliteus muscle.   Summary: Findings consistent with a strain of the popliteus    PATIENT SURVEYS:  LEFS 49/80 , 61%  COGNITION: Overall cognitive status: Within functional limits for tasks assessed     SENSATION: WFL  EDEMA: 1+ R knee   POSTURE: rounded shoulders  PALPATION: Exquisitely tender medial meniscus site R knee, also in prone over medial hamstring distally, and mid thigh along TFL  LOWER EXTREMITY ROM:  Active ROM Right eval Left eval  Hip flexion    Hip extension    Hip abduction    Hip adduction    Hip internal rotation    Hip external rotation    Knee flexion 128 140  Knee extension    Ankle dorsiflexion    Ankle plantarflexion    Ankle inversion    Ankle eversion     (Blank rows = not tested)  LOWER EXTREMITY MMT:  MMT Right eval Left eval  Hip flexion 5 5  Hip extension    Hip abduction    Hip adduction    Hip internal rotation    Hip external rotation    Knee flexion 4- 5  Knee extension 4-   Ankle dorsiflexion    Ankle plantarflexion 4-   Ankle inversion    Ankle eversion     01/23/23:  R quads 5/5, R hamstring 4-/5, painful  Functional strength, able to control eccentric lowering for lateral step down to L   LOWER EXTREMITY SPECIAL TESTS:  Posterior drawer R +  FUNCTIONAL TESTS:  6 minute walk test: needs test next visit Unilateral stance: R 20 sec( increased sway noted), L over 30 sec  GAIT: Distance walked: 42'  within clinic Assistive device utilized: None Level of assistance: Complete Independence Comments: decreased arm swing R, slight decrease in stance time on R   TODAY'S TREATMENT:  01/23/23: Therapeutic exercise: Treadmill, level, 3 mph, 6 min Standing hip extension RTB x 10 bil Standing hip abduction RTB x 10 bil Side steps RTB 17ft RTB at ankles x 2 sets Seated B knee ext 25#,15 x 2 Lateral step downs, heel taps from 3" step, light support R hand, 20x without pain Standing R LE SLR with red t band 15 reps   01/16/23: Therapeutic exercise: Bike level 3: 6 min Standing hip extension RTB x 10 bil Standing hip abduction RTB x 10 bil Side steps RTB 79ft RTB at ankles x 2 sets Seated B knee ext 25#,15 x 2 Fwd and lateral step down with LLE heel tap  15x Standing SLR 2# cuff weight 15x 2 sets Seated R LE hamstring curls green band    01/14/23 Therapeutic Exercise: to improve strength and mobility.  Demo, verbal and tactile cues throughout for technique.  Bike L1x32min Standing hip extension RTB x 10 bil Standing hip abduction RTB x 10 bil Side steps RTB 51ft RTB at ankles x 10  Seated R quad and hamstring isometrics 10x5" Supine R SLR 2x10 S/L R/L clamshells RTB 2x10 Fwd and lateral step down with LLE heel tap   01/07/23: Therapeutic exercise: progressed with instruction in strengthening ex for patient to perform at the gym, including: Standing R hip SLR, and extension with knee extended with pulley stack 5# 15 reps Standing red theraband penguins, to fatigue, band placed proximal to knees Seated B long arc quads with 15#, limiting full knee extension and monitoring for pain 15 reps Patient with painful and restricted Rom for [employer] position, therapist utilized cross friction massage R TFL and glut medius muscle belly, then instructed in supine piriformis stretch , R foot on knee , improved in sitting after session.                                                                                                                              DATE: 12/31/22    PATIENT EDUCATION:  Education details: POC, goals Person educated: Patient  Education method: Explanation and Demonstration Education comprehension: verbalized understanding  HOME EXERCISE PROGRAM: Access Code: [email or web address] URL: [email or web address] Date: 01/07/2023 Prepared by: Warren Lacy Lowana Hable  Exercises - Supine Bridge  - 1 x daily - 7 x weekly - 3 sets - 10 reps - Seated Piriformis Stretch  - 1 x daily - 7 x weekly - 3 sets - 10 reps - Supine Hip External Rotation Stretch  - 1 x daily - 7 x weekly - 3 sets - 10 reps - Side Stepping with Resistance at Thighs  - 1 x daily - 3 x weekly - 1 sets - 20 reps Access Code: [email or web address] URL: [email or web address] Date: 12/31/2022 Prepared by: Warren Lacy Edynn Gillock  Exercises - Supine Bridge  - 1 x daily - 7 x weekly - 3 sets - 10 reps - Seated Long Arc Quad  - 1 x daily - 7 x weekly - 3 sets - 10 reps - Seated Ankle Plantarflexion with Resistance  - 1 x daily - 7 x weekly - 3 sets - 10 reps  ASSESSMENT:  CLINICAL IMPRESSION: Patient is a 72 y.o. female, who was seen again today for physical therapy treatment for R popliteus muscle strain.  Overall much improved, no pain palpation R medial knee jt line, also able to tolerate progressive isolated R knee flexion strengthening. Today reassessed her and singl leg balance, MMT, 6 min walk test all wnl.  R hamstring strength testing with pain, also still some pain with weight bearing on R LE with twisting motion.  Placed on hold today, patient to call if further visits needed.  She also will contact her referring MD if condition worsens for additional imaging . Will dc if no work within next month OBJECTIVE IMPAIRMENTS: Abnormal gait, decreased balance, difficulty walking, decreased ROM, decreased strength, and pain.   ACTIVITY LIMITATIONS: carrying, lifting, bending, sitting, standing, and squatting  PARTICIPATION  LIMITATIONS: community activity and yard work  PERSONAL FACTORS: 1 comorbidity: osteopenia  are also affecting patient's functional outcome.   REHAB POTENTIAL: Good  CLINICAL DECISION MAKING: Stable/uncomplicated  EVALUATION COMPLEXITY: Low   GOALS: Goals reviewed with patient? Yes  SHORT TERM GOALS: Target date: 01/14/23 I Hep for long term condition management Baseline:initiated today Goal status: MET- 01/14/23   LONG TERM GOALS: Target date: 03/25/23  LEFS improve to 60/80 Baseline: 49 Goal status: IN PROGRESS  2.  Improve strength R quads, hamstrings to wfl R LE Baseline: 4-/5 Goal status: IN PROGRESS 01/23/23: pain with hamstring MMT unable to complete, quads 5/5  3.  Improve unilateral balancing on R LE to 30 sec Baseline: 20 sec   Goal status: MET 01/23/23: 01/23/23: met   4.  6 min walk test without deficits or increased pain R knee Baseline: not assessed, needs assessment in next 2 to 3 visits Goal status: MET  PLAN:  PT FREQUENCY: 1-2x/week  PT DURATION: 12 weeks  PLANNED INTERVENTIONS: Therapeutic exercises, Therapeutic activity, Neuromuscular re-education, Balance training, Gait training, Patient/Family education, Self Care, and Joint mobilization  PLAN FOR NEXT SESSION: hold today, await further word patient to call if needed Mettie Roylance L Abaigeal Moomaw, PT 01/23/2023, 2:19 PM

## 2023-02-03 ENCOUNTER — Encounter: Payer: Self-pay | Admitting: *Deleted

## 2023-03-24 ENCOUNTER — Other Ambulatory Visit: Payer: Self-pay

## 2023-03-24 ENCOUNTER — Other Ambulatory Visit (INDEPENDENT_AMBULATORY_CARE_PROVIDER_SITE_OTHER): Payer: Medicare Other

## 2023-03-24 DIAGNOSIS — E785 Hyperlipidemia, unspecified: Secondary | ICD-10-CM

## 2023-03-24 DIAGNOSIS — I10 Essential (primary) hypertension: Secondary | ICD-10-CM

## 2023-03-24 LAB — CBC WITH DIFFERENTIAL/PLATELET
Basophils Absolute: 0 10*3/uL (ref 0.0–0.1)
Basophils Relative: 0.4 % (ref 0.0–3.0)
Eosinophils Absolute: 0.2 10*3/uL (ref 0.0–0.7)
Eosinophils Relative: 3.6 % (ref 0.0–5.0)
HCT: 40 % (ref 36.0–46.0)
Hemoglobin: 13.3 g/dL (ref 12.0–15.0)
Lymphocytes Relative: 46 % (ref 12.0–46.0)
Lymphs Abs: 3.1 10*3/uL (ref 0.7–4.0)
MCHC: 33.2 g/dL (ref 30.0–36.0)
MCV: 88.7 fl (ref 78.0–100.0)
Monocytes Absolute: 0.5 10*3/uL (ref 0.1–1.0)
Monocytes Relative: 7.6 % (ref 3.0–12.0)
Neutro Abs: 2.9 10*3/uL (ref 1.4–7.7)
Neutrophils Relative %: 42.4 % — ABNORMAL LOW (ref 43.0–77.0)
Platelets: 230 10*3/uL (ref 150.0–400.0)
RBC: 4.51 Mil/uL (ref 3.87–5.11)
RDW: 13.4 % (ref 11.5–15.5)
WBC: 6.8 10*3/uL (ref 4.0–10.5)

## 2023-03-24 LAB — COMPREHENSIVE METABOLIC PANEL
ALT: 14 U/L (ref 0–35)
AST: 21 U/L (ref 0–37)
Albumin: 4.4 g/dL (ref 3.5–5.2)
Alkaline Phosphatase: 64 U/L (ref 39–117)
BUN: 16 mg/dL (ref 6–23)
CO2: 29 mEq/L (ref 19–32)
Calcium: 9.6 mg/dL (ref 8.4–10.5)
Chloride: 102 mEq/L (ref 96–112)
Creatinine, Ser: 0.99 mg/dL (ref 0.40–1.20)
GFR: 56.87 mL/min — ABNORMAL LOW (ref >60.00)
Glucose, Bld: 88 mg/dL (ref 70–99)
Potassium: 4.1 mEq/L (ref 3.5–5.1)
Sodium: 140 mEq/L (ref 135–145)
Total Bilirubin: 0.6 mg/dL (ref 0.2–1.2)
Total Protein: 6.7 g/dL (ref 6.0–8.3)

## 2023-03-24 LAB — LIPID PANEL
Cholesterol: 200 mg/dL (ref 0–200)
HDL: 47.2 mg/dL (ref >39.00)
LDL Cholesterol: 125 mg/dL — ABNORMAL HIGH (ref 0–99)
NonHDL: 152.81
Total CHOL/HDL Ratio: 4
Triglycerides: 141 mg/dL (ref 0.0–149.0)
VLDL: 28.2 mg/dL (ref 0.0–40.0)

## 2023-06-20 ENCOUNTER — Other Ambulatory Visit: Payer: Self-pay | Admitting: Family Medicine

## 2023-06-20 DIAGNOSIS — E785 Hyperlipidemia, unspecified: Secondary | ICD-10-CM

## 2023-07-01 DIAGNOSIS — H401131 Primary open-angle glaucoma, bilateral, mild stage: Secondary | ICD-10-CM | POA: Diagnosis not present

## 2023-07-01 DIAGNOSIS — H43811 Vitreous degeneration, right eye: Secondary | ICD-10-CM | POA: Diagnosis not present

## 2023-07-08 ENCOUNTER — Other Ambulatory Visit (HOSPITAL_BASED_OUTPATIENT_CLINIC_OR_DEPARTMENT_OTHER): Payer: Self-pay | Admitting: Family Medicine

## 2023-07-08 DIAGNOSIS — Z1231 Encounter for screening mammogram for malignant neoplasm of breast: Secondary | ICD-10-CM

## 2023-07-25 ENCOUNTER — Ambulatory Visit (INDEPENDENT_AMBULATORY_CARE_PROVIDER_SITE_OTHER): Payer: Medicare Other | Admitting: Family Medicine

## 2023-07-25 ENCOUNTER — Encounter: Payer: Self-pay | Admitting: Family Medicine

## 2023-07-25 VITALS — BP 132/51 | HR 73 | Temp 98.3°F | Resp 18 | Ht 61.5 in | Wt 160.2 lb

## 2023-07-25 DIAGNOSIS — M5030 Other cervical disc degeneration, unspecified cervical region: Secondary | ICD-10-CM | POA: Diagnosis not present

## 2023-07-25 MED ORDER — NAPROXEN 500 MG PO TABS
500.0000 mg | ORAL_TABLET | Freq: Two times a day (BID) | ORAL | 1 refills | Status: AC
Start: 2023-07-25 — End: ?

## 2023-07-25 NOTE — Progress Notes (Signed)
Acute Office Visit  Subjective:     Patient ID: [name], female    DOB: August 22, 1951, 72 y.o.   MRN: [id number]  Chief Complaint  Patient presents with   Neck Pain    Numbness in bilateral fingertips- R hand worse than L. MRI done at [facility] imaging in 2017 ordered by [facility].      Patient is in today for neck pain with radicular symptoms.   Discussed the use of AI scribe software for clinical note transcription with the patient, who gave verbal consent to proceed.  History of Present Illness   The patient, with a history of severe multilevel facet arthrosis, mild facet edema, and advanced disc space height loss at C5 to C6, presents with a flare-up of neck pain and tingling in both hands. The symptoms have been worsening over the past two weeks, particularly during this time of year. The neck pain, at its worst, is rated as an 8 out of 10 and is associated with a tingling sensation in all fingers of both hands. The patient has not sought any treatment since the last MRI in 2017.  The patient is physically active, regularly attending the gym for weight lifting, but has been advised against lifting heavy weights above the head due to the neck condition. She denies any other neuro symptoms.       MRI Cervical Spine 2017 IMPRESSION: 1. Severe multilevel cervical facet arthrosis. Mild facet edema on the left at C2-3. 2. Advanced disc space height loss at C5-6 without stenosis. 3. Mild cervical disc degeneration elsewhere with mild neural foraminal narrowing as above. No spinal stenosis.   These results will be called to the ordering clinician or representative by the Radiology Department at the imaging location.    All review of systems negative except what is listed in the HPI      Objective:    BP (!) 132/51 (BP Location: Right Arm, Patient Position: Sitting, Cuff Size: Normal)   Pulse 73   Temp 98.3 F (36.8 C) (Temporal)   Resp 18   Ht 5' 1.5" (1.562 m)   Wt 160 lb  3.2 oz (72.7 kg)   SpO2 99%   BMI 29.78 kg/m    Physical Exam Vitals reviewed.  Constitutional:      Appearance: Normal appearance.  HENT:     Head: Normocephalic and atraumatic.  Neck:     Comments: Mildly decreased ROM due to pain, some tenderness to palpation of cervical paraspinal muscles Musculoskeletal:     Cervical back: Normal range of motion and neck supple.     Comments: Strength-grip 5/5 bilaterally   Lymphadenopathy:     Cervical: No cervical adenopathy.  Skin:    General: Skin is warm and dry.  Neurological:     Mental Status: She is alert and oriented to person, place, and time.  Psychiatric:        Mood and Affect: Mood normal.        Behavior: Behavior normal.        Thought Content: Thought content normal.        Judgment: Judgment normal.         No results found for any visits on 07/25/23.      Assessment & Plan:   Problem List Items Addressed This Visit       Active Problems   DDD (degenerative disc disease), cervical - Primary    Severe multilevel facet arthrosis, mild facet edema, advanced disc space height  loss at C5 to C6, and mild neuro foraminal narrowing. Flare-up for the past two weeks with neck pain (8/10 at worst, currently 3-4/10) and bilateral hand tingling. No recent injury. -Refer to spine specialist for further evaluation and treatment plan. -Trial of Naproxen (Aleve) BID for inflammation and pain management. -Continue heat/ice and neck stretches at home.      Relevant Medications   naproxen (NAPROSYN) 500 MG tablet   Other Relevant Orders   Ambulatory referral to Spine Surgery    Meds ordered this encounter  Medications   naproxen (NAPROSYN) 500 MG tablet    Sig: Take 1 tablet (500 mg total) by mouth 2 (two) times daily with a meal.    Dispense:  60 tablet    Refill:  1    Order Specific Question:   Supervising Provider    Answer:   Bradd Canary [[id number]]    Return for routine follow-up/CPE March 2025.  Clayborne Dana, NP

## 2023-07-25 NOTE — Assessment & Plan Note (Signed)
Severe multilevel facet arthrosis, mild facet edema, advanced disc space height loss at C5 to C6, and mild neuro foraminal narrowing. Flare-up for the past two weeks with neck pain (8/10 at worst, currently 3-4/10) and bilateral hand tingling. No recent injury. -Refer to spine specialist for further evaluation and treatment plan. -Trial of Naproxen (Aleve) BID for inflammation and pain management. -Continue heat/ice and neck stretches at home.

## 2023-07-30 ENCOUNTER — Other Ambulatory Visit (HOSPITAL_BASED_OUTPATIENT_CLINIC_OR_DEPARTMENT_OTHER): Payer: Self-pay | Admitting: Neurosurgery

## 2023-07-30 DIAGNOSIS — M5412 Radiculopathy, cervical region: Secondary | ICD-10-CM | POA: Diagnosis not present

## 2023-07-30 DIAGNOSIS — M5010 Cervical disc disorder with radiculopathy, unspecified cervical region: Secondary | ICD-10-CM | POA: Diagnosis not present

## 2023-07-30 DIAGNOSIS — M4722 Other spondylosis with radiculopathy, cervical region: Secondary | ICD-10-CM | POA: Diagnosis not present

## 2023-07-30 DIAGNOSIS — M4802 Spinal stenosis, cervical region: Secondary | ICD-10-CM | POA: Diagnosis not present

## 2023-08-08 NOTE — Therapy (Signed)
OUTPATIENT PHYSICAL THERAPY CERVICAL EVALUATION   Patient Name: [name] MRN: [id number] DOB:1951/08/18, 72 y.o., female Today's Date: 08/11/2023  END OF SESSION:  PT End of Session - 08/11/23 0848     Visit Number 1    Date for PT Re-Evaluation 10/06/23    Authorization Type UHC Medicare and Tricare for Life    PT Start Time 0850    PT Stop Time 0928    PT Time Calculation (min) 38 min    Activity Tolerance Patient tolerated treatment well    Behavior During Therapy WFL for tasks assessed/performed             Past Medical History:  Diagnosis Date   Cancer (HCC)    Basal cell carcinoma of the nose.   Gastroparesis    Hyperlipidemia    Hypertension    Osteopenia    Past Surgical History:  Procedure Laterality Date   CATARACT EXTRACTION Bilateral    Patient Active Problem List   Diagnosis Date Noted   DDD (degenerative disc disease), cervical 07/25/2023   Popliteus tendinitis of right lower extremity 11/25/2022   Dyslipidemia, goal to be determined 02/10/2017   Essential hypertension 02/10/2017    PCP: Clayborne Dana, NP   REFERRING PROVIDER: Bedelia Person, MD  REFERRING DIAG: [phone] (ICD-10-CM) - Radiculopathy, cervical region   THERAPY DIAG:  Cervicalgia  Cramp and spasm  Rationale for Evaluation and Treatment: Rehabilitation  ONSET DATE: chronic, worsened September 2024.   SUBJECTIVE:                                                                                                                                                                                                         SUBJECTIVE STATEMENT: Neck pain is ongoing but seems to worsen this time of year, she was pressure washing and painting her house about 3 weeks ago after got HOA  notice and that aggravated it. She had MRI yesterday to see if things have changed but results not back yet.     Gets pain down her R arm, only gets numbness if lifting heavy things overhead.  Hand  dominance: Right  PERTINENT HISTORY:  Degenerative disk disease, HTN, osteopenia  PAIN:  Are you having pain? Yes: NPRS scale: 3-8/10 Pain location: R side of neck Pain description: throbbing Aggravating factors: lifitng overhead, lifting weights, cold Relieving factors: heat, sometimes cold  PRECAUTIONS: None  RED FLAGS: None     WEIGHT BEARING RESTRICTIONS: No  FALLS:  Has patient fallen in last 6 months? No  LIVING ENVIRONMENT: Lives  with: lives alone Lives in: House/apartment Stairs: No Has following equipment at home: None  OCCUPATION: retired  PLOF: Independent  PATIENT GOALS: get all this stiffness and pain out of my neck   NEXT MD VISIT: none scheduled, will scheduled follow-up after MRI results back   OBJECTIVE:   DIAGNOSTIC FINDINGS:  New MR completed 08/10/23  11/23/2015 MR cervical IMPRESSION: 1. Severe multilevel cervical facet arthrosis. Mild facet edema on the left at C2-3. 2. Advanced disc space height loss at C5-6 without stenosis. 3. Mild cervical disc degeneration elsewhere with mild neural foraminal narrowing as above. No spinal stenosis. PATIENT SURVEYS:  NDI 14/50  COGNITION: Overall cognitive status: Within functional limits for tasks assessed  SENSATION: WFL  POSTURE: No Significant postural limitations  PALPATION: Tenderness/tightness bil UT/LS R>L   CERVICAL ROM:   Active ROM A/PROM (deg) eval  Flexion 20p!  Extension 10p!  Right lateral flexion 20p!  Left lateral flexion 22p!  Right rotation 45p!  Left rotation 40p!   (Blank rows = not tested)  UPPER EXTREMITY ROM:  WNL, symmetric and pain free   UPPER EXTREMITY MMT:  MMT Right eval Left eval  Shoulder flexion 5 5  Shoulder extension 5 5  Shoulder abduction 5 5  Shoulder internal rotation 5 5  Shoulder external rotation 5 5  Elbow flexion 5 5  Elbow extension 5 5  Wrist flexion 5 5  Wrist extension 5 5   Grip strength 30lb 25lb   (Blank rows = not  tested)  CERVICAL SPECIAL TESTS:  Spurling's test: Negative and Distraction test: Negative   TODAY'S TREATMENT:                                                                                                                              DATE:   08/11/23 EVAL Self Care: Findings, POC, initial HEP - see HEP demo and return demo   PATIENT EDUCATION:  Education details: findings, POC, initial HEP,  Person educated: Patient Education method: Programmer, multimedia, Demonstration, Verbal cues, and Handouts Education comprehension: verbalized understanding and returned demonstration  HOME EXERCISE PROGRAM: Access Code: [email or web address] URL: [email or web address] Date: 08/11/2023 Prepared by: Harrie Foreman  Exercises - Seated Shoulder Rolls  - 3 x daily - 7 x weekly - 1 sets - 10 reps - Seated Scapular Retraction  - 3 x daily - 7 x weekly - 1 sets - 10 reps - Seated Cervical Retraction  - 3 x daily - 7 x weekly - 1 sets - 10 reps - Seated Cervical Rotation AROM  - 3 x daily - 7 x weekly - 1 sets - 10 reps  ASSESSMENT:  CLINICAL IMPRESSION: Patient is a 72 y.o. right hand dominant female who was seen today for physical therapy evaluation and treatment for neck pain.   She reports history of chronic neck pain but current episode started with overuse pressure washing and painting home in September.  She demonstrates muscle spasms in cervical paraspinals, UT  and levators, but no deficits in UE strength or sensation, so pain appears to be primarily due to muscle spasm.  Today gave initial HEP for gentle AROM and postural exercises, and discussed TrDN.  KYLYNN ONUFER would benefit from skilled physical therapy to decrease neck pain, restore normal cervical mobility and return to PLOF.    OBJECTIVE IMPAIRMENTS: decreased activity tolerance, decreased ROM, hypomobility, increased fascial restrictions, impaired perceived functional ability, increased muscle spasms, and pain.   ACTIVITY  LIMITATIONS: carrying, lifting, bending, sleeping, transfers, bed mobility, and reach over head  PARTICIPATION LIMITATIONS: meal prep, cleaning, laundry, driving, shopping, community activity, and yard work  PERSONAL FACTORS: Past/current experiences, Time since onset of injury/illness/exacerbation, and 1-2 comorbidities: Degenerative disk disease, HTN, osteopenia  are also affecting patient's functional outcome.   REHAB POTENTIAL: Good  CLINICAL DECISION MAKING: Stable/uncomplicated  EVALUATION COMPLEXITY: Low   GOALS: Goals reviewed with patient? Yes  SHORT TERM GOALS: Target date: 08/25/2023   Patient will be independent with initial HEP.  Baseline:  Goal status: INITIAL  LONG TERM GOALS: Target date: 10/06/2023   Patient will be independent with advanced/ongoing HEP to improve outcomes and carryover.  Baseline:  Goal status: IN PROGRESS  2.  Patient will report 75% improvement in neck pain to improve QOL.  Baseline:  Goal status: INITIAL  3.  Patient will demonstrate full pain free cervical ROM for safety with driving.  Baseline: see objective Goal status: INITIAL  4.  Patient will report at least 8 points improvement on NDI to demonstrate improved functional ability.  Baseline: 14/50 Goal status: INITIAL  PLAN:  PT FREQUENCY: 1-2x/week  PT DURATION: 8 weeks  PLANNED INTERVENTIONS: 97110-Therapeutic exercises, 97530- Therapeutic activity, 97112- Neuromuscular re-education, 97535- Self Care, 01601- Manual therapy, 97014- Electrical stimulation (unattended), 97035- Ultrasound, 09323- Traction (mechanical), Taping, Dry Needling, Joint mobilization, Joint manipulation, Spinal manipulation, Spinal mobilization, Cryotherapy, and Moist heat  PLAN FOR NEXT SESSION: review and progress HEP, thoracic mobility exercises, TrDN to UT/LS, cervical multifidi, manual therapy, modalities PRN.    Jena Gauss, PT, DPT 08/11/2023, 12:22 PM

## 2023-08-10 ENCOUNTER — Ambulatory Visit (HOSPITAL_BASED_OUTPATIENT_CLINIC_OR_DEPARTMENT_OTHER)
Admission: RE | Admit: 2023-08-10 | Discharge: 2023-08-10 | Disposition: A | Discharge status: Home / Self Care | Payer: Medicare Other | Source: Ambulatory Visit | Attending: Neurosurgery | Admitting: Neurosurgery

## 2023-08-10 DIAGNOSIS — R29898 Other symptoms and signs involving the musculoskeletal system: Secondary | ICD-10-CM | POA: Diagnosis not present

## 2023-08-10 DIAGNOSIS — G8929 Other chronic pain: Secondary | ICD-10-CM | POA: Diagnosis not present

## 2023-08-10 DIAGNOSIS — M5020 Other cervical disc displacement, unspecified cervical region: Secondary | ICD-10-CM | POA: Diagnosis not present

## 2023-08-10 DIAGNOSIS — M5412 Radiculopathy, cervical region: Secondary | ICD-10-CM | POA: Insufficient documentation

## 2023-08-11 ENCOUNTER — Ambulatory Visit: Payer: Medicare Other | Attending: Neurosurgery | Admitting: Physical Therapy

## 2023-08-11 ENCOUNTER — Encounter: Payer: Self-pay | Admitting: Physical Therapy

## 2023-08-11 ENCOUNTER — Other Ambulatory Visit: Payer: Self-pay

## 2023-08-11 DIAGNOSIS — M542 Cervicalgia: Secondary | ICD-10-CM | POA: Insufficient documentation

## 2023-08-11 DIAGNOSIS — R252 Cramp and spasm: Secondary | ICD-10-CM | POA: Insufficient documentation

## 2023-08-13 ENCOUNTER — Encounter: Payer: Self-pay | Admitting: Physical Therapy

## 2023-08-13 ENCOUNTER — Ambulatory Visit: Payer: Medicare Other | Admitting: Physical Therapy

## 2023-08-13 DIAGNOSIS — R252 Cramp and spasm: Secondary | ICD-10-CM | POA: Diagnosis not present

## 2023-08-13 DIAGNOSIS — M542 Cervicalgia: Secondary | ICD-10-CM

## 2023-08-13 NOTE — Therapy (Signed)
OUTPATIENT PHYSICAL THERAPY TREATMENT   Patient Name: [name] MRN: [id number] DOB:12/28/1950, 72 y.o., female Today's Date: 08/13/2023  END OF SESSION:  PT End of Session - 08/13/23 0851     Visit Number 2    Date for PT Re-Evaluation 10/06/23    Authorization Type [employer] Medicare and [employer] for [employer]    PT Start Time 0848    PT Stop Time 0930    PT Time Calculation (min) 42 min    Activity Tolerance Patient tolerated treatment well    Behavior During Therapy WFL for tasks assessed/performed             Past Medical History:  Diagnosis Date   Cancer (HCC)    Basal cell carcinoma of the nose.   Gastroparesis    Hyperlipidemia    Hypertension    Osteopenia    Past Surgical History:  Procedure Laterality Date   CATARACT EXTRACTION Bilateral    Patient Active Problem List   Diagnosis Date Noted   DDD (degenerative disc disease), cervical 07/25/2023   Popliteus tendinitis of right lower extremity 11/25/2022   Dyslipidemia, goal to be determined 02/10/2017   Essential hypertension 02/10/2017    PCP: Clayborne Dana, NP   REFERRING PROVIDER: Bedelia Person, MD  REFERRING DIAG: [phone] (ICD-10-CM) - Radiculopathy, cervical region   THERAPY DIAG:  Cervicalgia  Cramp and spasm  Rationale for Evaluation and Treatment: Rehabilitation  ONSET DATE: chronic, worsened September 2024.   SUBJECTIVE:                                                                                                                                                                                                         SUBJECTIVE STATEMENT: Feeling better, exercises helped.   Even went back to the gym and exercised.  Hand dominance: Right  PERTINENT HISTORY:  Degenerative disk disease, HTN, osteopenia  PAIN:  Are you having pain? Yes: NPRS scale: 2/10 Pain location: R side of neck Pain description: throbbing Aggravating factors: lifitng overhead, lifting weights,  cold Relieving factors: heat, sometimes cold  PRECAUTIONS: None  RED FLAGS: None     WEIGHT BEARING RESTRICTIONS: No  FALLS:  Has patient fallen in last 6 months? No  LIVING ENVIRONMENT: Lives with: lives alone Lives in: House/apartment Stairs: No Has following equipment at home: None  OCCUPATION: retired  PLOF: Independent  PATIENT GOALS: get all this stiffness and pain out of my neck   NEXT MD VISIT: none scheduled, will scheduled follow-up after MRI results back   OBJECTIVE:   DIAGNOSTIC  FINDINGS:  New MR completed 08/10/23  11/23/2015 MR cervical IMPRESSION: 1. Severe multilevel cervical facet arthrosis. Mild facet edema on the left at C2-3. 2. Advanced disc space height loss at C5-6 without stenosis. 3. Mild cervical disc degeneration elsewhere with mild neural foraminal narrowing as above. No spinal stenosis. PATIENT SURVEYS:  NDI 14/50  COGNITION: Overall cognitive status: Within functional limits for tasks assessed  SENSATION: WFL  POSTURE: No Significant postural limitations  PALPATION: Tenderness/tightness bil UT/LS R>L   CERVICAL ROM:   Active ROM A/PROM (deg) eval  Flexion 20p!  Extension 10p!  Right lateral flexion 20p!  Left lateral flexion 22p!  Right rotation 45p!  Left rotation 40p!   (Blank rows = not tested)  UPPER EXTREMITY ROM:  WNL, symmetric and pain free   UPPER EXTREMITY MMT:  MMT Right eval Left eval  Shoulder flexion 5 5  Shoulder extension 5 5  Shoulder abduction 5 5  Shoulder internal rotation 5 5  Shoulder external rotation 5 5  Elbow flexion 5 5  Elbow extension 5 5  Wrist flexion 5 5  Wrist extension 5 5   Grip strength 30lb 25lb   (Blank rows = not tested)  CERVICAL SPECIAL TESTS:  Spurling's test: Negative and Distraction test: Negative   TODAY'S TREATMENT:                                                                                                                              DATE:    08/13/23 Therapeutic Exercise: to improve strength and mobility.  Demo, verbal and tactile cues throughout for technique. UBE L1 x forward Self snags with pillow case - extension x 10, rotation x 5 each side Levator, UT and Scalene stretch each side Manual Therapy: to decrease muscle spasm and pain and improve mobility STM/TPR to bil UT, levator scapulae, cervical paraspinals, UPA/PA mobs cervical spine grade 1-2, NAGs into rotation, gentle cervical traction, skilled palpation and monitoring during dry needling. Trigger Point Dry-Needling  Treatment instructions: Expect mild to moderate muscle soreness. S/S of pneumothorax if dry needled over a lung field, and to seek immediate medical attention should they occur. Patient verbalized understanding of these instructions and education. Patient Consent Given: Yes Education handout provided: Yes Muscles treated: R UT, R L/S, bil cervical multifidi C5 Electrical stimulation performed: No Parameters: N/A Treatment response/outcome: Twitch Response Elicited and Palpable Increase in Muscle Length  08/11/23 EVAL Self Care: Findings, POC, initial HEP - see HEP demo and return demo   PATIENT EDUCATION:  Education details: findings, POC, initial HEP,  Person educated: Patient Education method: Programmer, multimedia, Demonstration, Verbal cues, and Handouts Education comprehension: verbalized understanding and returned demonstration  HOME EXERCISE PROGRAM: Access Code: [email or web address] URL: [email or web address] Date: 08/13/2023 Prepared by: Harrie Foreman  Exercises - Seated Shoulder Rolls  - 3 x daily - 7 x weekly - 1 sets - 10 reps - Seated Scapular Retraction  - 3 x daily - 7 x weekly -  1 sets - 10 reps - Seated Cervical Retraction  - 3 x daily - 7 x weekly - 1 sets - 10 reps - Seated Cervical Rotation AROM  - 3 x daily - 7 x weekly - 1 sets - 10 reps - Cervical Extension AROM with Strap  - 1 x daily - 7 x weekly - 1 sets - 10  reps - Seated Assisted Cervical Rotation with Towel  - 1 x daily - 7 x weekly - 1 sets - 10 reps - Seated Cervical Sidebending AROM  - 1 x daily - 7 x weekly - 1 sets - 3 reps - 10-30sec hold - Gentle Levator Scapulae Stretch  - 1 x daily - 7 x weekly - 1 sets - 3 reps - 10-30sec hold  ASSESSMENT:  CLINICAL IMPRESSION: Kayren Kissling Philbert reports improvement in symptoms with initial HEP, meeting STG #1.  Today progressed exercises with gentle stretches and self SNAGs with good tolerance, followed by manual therapy including TrDN.  After explanation of DN rational, procedures, outcomes and potential side effects, patient verbalized consent to DN treatment in conjunction with manual STM/DTM and TPR to reduce ttp/muscle tension. Muscles treated as indicated above. DN produced normal response with good twitches elicited resulting in palpable reduction in pain/ttp and muscle tension, with patient noting less pain upon initiation of movement following DN. Pt educated to expect mild to moderate muscle soreness for up to 24-48 hrs and instructed to continue prescribed home exercise program and current activity level with pt verbalizing understanding of theses instructions.   SHARHONDA STOCKWELL continues to demonstrate potential for improvement and would benefit from continued skilled therapy to address impairments.    OBJECTIVE IMPAIRMENTS: decreased activity tolerance, decreased ROM, hypomobility, increased fascial restrictions, impaired perceived functional ability, increased muscle spasms, and pain.   ACTIVITY LIMITATIONS: carrying, lifting, bending, sleeping, transfers, bed mobility, and reach over head  PARTICIPATION LIMITATIONS: meal prep, cleaning, laundry, driving, shopping, community activity, and yard work  PERSONAL FACTORS: Past/current experiences, Time since onset of injury/illness/exacerbation, and 1-2 comorbidities: Degenerative disk disease, HTN, osteopenia  are also affecting patient's  functional outcome.   REHAB POTENTIAL: Good  CLINICAL DECISION MAKING: Stable/uncomplicated  EVALUATION COMPLEXITY: Low   GOALS: Goals reviewed with patient? Yes  SHORT TERM GOALS: Target date: 08/25/2023   Patient will be independent with initial HEP.  Baseline:  Goal status: MET 08/13/23  LONG TERM GOALS: Target date: 10/06/2023   Patient will be independent with advanced/ongoing HEP to improve outcomes and carryover.  Baseline:  Goal status: IN PROGRESS  2.  Patient will report 75% improvement in neck pain to improve QOL.  Baseline:  Goal status: IN PROGRESS  3.  Patient will demonstrate full pain free cervical ROM for safety with driving.  Baseline: see objective Goal status: IN PROGRESS  4.  Patient will report at least 8 points improvement on NDI to demonstrate improved functional ability.  Baseline: 14/50 Goal status: IN PROGRESS  PLAN:  PT FREQUENCY: 1-2x/week  PT DURATION: 8 weeks  PLANNED INTERVENTIONS: 97110-Therapeutic exercises, 97530- Therapeutic activity, 97112- Neuromuscular re-education, 97535- Self Care, 32951- Manual therapy, 97014- Electrical stimulation (unattended), 97035- Ultrasound, 88416- Traction (mechanical), Taping, Dry Needling, Joint mobilization, Joint manipulation, Spinal manipulation, Spinal mobilization, Cryotherapy, and Moist heat  PLAN FOR NEXT SESSION:  assess response to TrDN, continue to progress exercises - scapular and postural strengthening, manual therapy, modalities PRN.   May also try cervical traction for muscle relaxation.    Jena Gauss, PT, DPT  08/13/2023, 11:15 AM

## 2023-08-18 ENCOUNTER — Ambulatory Visit (HOSPITAL_BASED_OUTPATIENT_CLINIC_OR_DEPARTMENT_OTHER)
Admission: RE | Admit: 2023-08-18 | Discharge: 2023-08-18 | Disposition: A | Discharge status: Home / Self Care | Payer: Medicare Other | Source: Ambulatory Visit | Attending: Family Medicine | Admitting: Family Medicine

## 2023-08-18 ENCOUNTER — Encounter (HOSPITAL_BASED_OUTPATIENT_CLINIC_OR_DEPARTMENT_OTHER): Payer: Self-pay

## 2023-08-18 ENCOUNTER — Ambulatory Visit: Payer: Medicare Other

## 2023-08-18 DIAGNOSIS — M542 Cervicalgia: Secondary | ICD-10-CM

## 2023-08-18 DIAGNOSIS — R252 Cramp and spasm: Secondary | ICD-10-CM

## 2023-08-18 DIAGNOSIS — Z1231 Encounter for screening mammogram for malignant neoplasm of breast: Secondary | ICD-10-CM | POA: Diagnosis not present

## 2023-08-18 NOTE — Therapy (Signed)
OUTPATIENT PHYSICAL THERAPY TREATMENT   Patient Name: [name] MRN: [id number] DOB:07-Jul-1951, 72 y.o., female Today's Date: 08/18/2023  END OF SESSION:  PT End of Session - 08/18/23 0907     Visit Number 3    Date for PT Re-Evaluation 10/06/23    Authorization Type [employer] Medicare and T[employer] for Life    PT Start Time [phone]    PT Stop Time 0932    PT Time Calculation (min) 49 min    Activity Tolerance Patient tolerated treatment well    Behavior During Therapy WFL for tasks assessed/performed              Past Medical History:  Diagnosis Date   Cancer (HCC)    Basal cell carcinoma of the nose.   Gastroparesis    Hyperlipidemia    Hypertension    Osteopenia    Past Surgical History:  Procedure Laterality Date   CATARACT EXTRACTION Bilateral    Patient Active Problem List   Diagnosis Date Noted   DDD (degenerative disc disease), cervical 07/25/2023   Popliteus tendinitis of right lower extremity 11/25/2022   Dyslipidemia, goal to be determined 02/10/2017   Essential hypertension 02/10/2017    PCP: Clayborne Dana, NP   REFERRING PROVIDER: Bedelia Person, MD  REFERRING DIAG: [phone] (ICD-10-CM) - Radiculopathy, cervical region   THERAPY DIAG:  Cervicalgia  Cramp and spasm  Rationale for Evaluation and Treatment: Rehabilitation  ONSET DATE: chronic, worsened September 2024.   SUBJECTIVE:                                                                                                                                                                                                         SUBJECTIVE STATEMENT: Pt reports stiffness today, pt reports "headache" type of pain today. Hand dominance: Right  PERTINENT HISTORY:  Degenerative disk disease, HTN, osteopenia  PAIN:  Are you having pain? Yes: NPRS scale: 4/10 Pain location: R side of neck Pain description: throbbing Aggravating factors: lifitng overhead, lifting weights, cold Relieving  factors: heat, sometimes cold  PRECAUTIONS: None  RED FLAGS: None     WEIGHT BEARING RESTRICTIONS: No  FALLS:  Has patient fallen in last 6 months? No  LIVING ENVIRONMENT: Lives with: lives alone Lives in: House/apartment Stairs: No Has following equipment at home: None  OCCUPATION: retired  PLOF: Independent  PATIENT GOALS: get all this stiffness and pain out of my neck   NEXT MD VISIT: none scheduled, will scheduled follow-up after MRI results back   OBJECTIVE:   DIAGNOSTIC FINDINGS:  New  MR completed 08/10/23  11/23/2015 MR cervical IMPRESSION: 1. Severe multilevel cervical facet arthrosis. Mild facet edema on the left at C2-3. 2. Advanced disc space height loss at C5-6 without stenosis. 3. Mild cervical disc degeneration elsewhere with mild neural foraminal narrowing as above. No spinal stenosis. PATIENT SURVEYS:  NDI 14/50  COGNITION: Overall cognitive status: Within functional limits for tasks assessed  SENSATION: WFL  POSTURE: No Significant postural limitations  PALPATION: Tenderness/tightness bil UT/LS R>L   CERVICAL ROM:   Active ROM A/PROM (deg) eval  Flexion 20p!  Extension 10p!  Right lateral flexion 20p!  Left lateral flexion 22p!  Right rotation 45p!  Left rotation 40p!   (Blank rows = not tested)  UPPER EXTREMITY ROM:  WNL, symmetric and pain free   UPPER EXTREMITY MMT:  MMT Right eval Left eval  Shoulder flexion 5 5  Shoulder extension 5 5  Shoulder abduction 5 5  Shoulder internal rotation 5 5  Shoulder external rotation 5 5  Elbow flexion 5 5  Elbow extension 5 5  Wrist flexion 5 5  Wrist extension 5 5   Grip strength 30lb 25lb   (Blank rows = not tested)  CERVICAL SPECIAL TESTS:  Spurling's test: Negative and Distraction test: Negative   TODAY'S TREATMENT:                                                                                                                              DATE:  08/18/23 Therapeutic  Exercise: to improve strength and mobility.  Demo, verbal and tactile cues throughout for technique. UBE L1 x 4 min forward - pain in R UT SNAG for cervical extension 10x3"  SNAG for rotation bil 10x3" Thoracic extension arms crossed in chair 10x3" Standing ER with YTB x 10 R/L  Standing rows YTB x 10   Manual Therapy: to decrease muscle spasm and pain and improve mobility STM/TPR to R UT, levator scapulae, cervical paraspinals, suboccipital release  08/13/23 Therapeutic Exercise: to improve strength and mobility.  Demo, verbal and tactile cues throughout for technique. UBE L1 x forward Self snags with pillow case - extension x 10, rotation x 5 each side Levator, UT and Scalene stretch each side Manual Therapy: to decrease muscle spasm and pain and improve mobility STM/TPR to bil UT, levator scapulae, cervical paraspinals, UPA/PA mobs cervical spine grade 1-2, NAGs into rotation, gentle cervical traction, skilled palpation and monitoring during dry needling. Trigger Point Dry-Needling  Treatment instructions: Expect mild to moderate muscle soreness. S/S of pneumothorax if dry needled over a lung field, and to seek immediate medical attention should they occur. Patient verbalized understanding of these instructions and education. Patient Consent Given: Yes Education handout provided: Yes Muscles treated: R UT, R L/S, bil cervical multifidi C5 Electrical stimulation performed: No Parameters: N/A Treatment response/outcome: Twitch Response Elicited and Palpable Increase in Muscle Length  08/11/23 EVAL Self Care: Findings, POC, initial HEP - see HEP demo and return demo  PATIENT EDUCATION:  Education details: HEP update- rows and ER with YTB Person educated: Patient Education method: Explanation, Demonstration, Verbal cues, and Handouts Education comprehension: verbalized understanding and returned demonstration  HOME EXERCISE PROGRAM: Access Code: [email or web address] URL:  [email or web address] Date: 08/18/2023 Prepared by: Verta Ellen  Exercises - Seated Shoulder Rolls  - 3 x daily - 7 x weekly - 1 sets - 10 reps - Seated Scapular Retraction  - 3 x daily - 7 x weekly - 1 sets - 10 reps - Seated Cervical Retraction  - 3 x daily - 7 x weekly - 1 sets - 10 reps - Seated Cervical Rotation AROM  - 3 x daily - 7 x weekly - 1 sets - 10 reps - Cervical Extension AROM with Strap  - 1 x daily - 7 x weekly - 1 sets - 10 reps - Seated Assisted Cervical Rotation with Towel  - 1 x daily - 7 x weekly - 1 sets - 10 reps - Seated Cervical Sidebending AROM  - 1 x daily - 7 x weekly - 1 sets - 3 reps - 10-30sec hold - Gentle Levator Scapulae Stretch  - 1 x daily - 7 x weekly - 1 sets - 3 reps - 10-30sec hold - Shoulder External Rotation with Anchored Resistance  - 1 x daily - 7 x weekly - 2 sets - 10 reps - Shoulder extension with resistance - Neutral  - 1 x daily - 7 x weekly - 2 sets - 10 reps  ASSESSMENT:  CLINICAL IMPRESSION: Pt shows increased tension, TTP in the R UT,LS, and suboccipitals. Demonstrated initial tightness with cervical movements but after MT showed increased mobility. Progressed thoracic mobility and postural exercises to reduce strain on cervical spine.   TAYLIA PESKA continues to demonstrate potential for improvement and would benefit from continued skilled therapy to address impairments.    OBJECTIVE IMPAIRMENTS: decreased activity tolerance, decreased ROM, hypomobility, increased fascial restrictions, impaired perceived functional ability, increased muscle spasms, and pain.   ACTIVITY LIMITATIONS: carrying, lifting, bending, sleeping, transfers, bed mobility, and reach over head  PARTICIPATION LIMITATIONS: meal prep, cleaning, laundry, driving, shopping, community activity, and yard work  PERSONAL FACTORS: Past/current experiences, Time since onset of injury/illness/exacerbation, and 1-2 comorbidities: Degenerative disk disease,  HTN, osteopenia  are also affecting patient's functional outcome.   REHAB POTENTIAL: Good  CLINICAL DECISION MAKING: Stable/uncomplicated  EVALUATION COMPLEXITY: Low   GOALS: Goals reviewed with patient? Yes  SHORT TERM GOALS: Target date: 08/25/2023   Patient will be independent with initial HEP.  Baseline:  Goal status: MET 08/13/23  LONG TERM GOALS: Target date: 10/06/2023   Patient will be independent with advanced/ongoing HEP to improve outcomes and carryover.  Baseline:  Goal status: IN PROGRESS  2.  Patient will report 75% improvement in neck pain to improve QOL.  Baseline:  Goal status: IN PROGRESS  3.  Patient will demonstrate full pain free cervical ROM for safety with driving.  Baseline: see objective Goal status: IN PROGRESS  4.  Patient will report at least 8 points improvement on NDI to demonstrate improved functional ability.  Baseline: 14/50 Goal status: IN PROGRESS  PLAN:  PT FREQUENCY: 1-2x/week  PT DURATION: 8 weeks  PLANNED INTERVENTIONS: 97110-Therapeutic exercises, 97530- Therapeutic activity, 97112- Neuromuscular re-education, 97535- Self Care, 02725- Manual therapy, 97014- Electrical stimulation (unattended), 97035- Ultrasound, 36644- Traction (mechanical), Taping, Dry Needling, Joint mobilization, Joint manipulation, Spinal manipulation, Spinal mobilization, Cryotherapy, and Moist heat  PLAN FOR NEXT SESSION: continue to progress exercises -  scapular and postural strengthening, manual therapy, modalities PRN.   May also try cervical traction for muscle relaxation.    Darleene Cleaver, PTA 08/18/2023, 9:53 AM

## 2023-08-20 ENCOUNTER — Ambulatory Visit: Payer: Medicare Other | Admitting: Physical Therapy

## 2023-08-25 ENCOUNTER — Ambulatory Visit: Payer: Medicare Other | Attending: Neurosurgery | Admitting: Physical Therapy

## 2023-08-25 ENCOUNTER — Encounter: Payer: Self-pay | Admitting: Physical Therapy

## 2023-08-25 DIAGNOSIS — M542 Cervicalgia: Secondary | ICD-10-CM

## 2023-08-25 DIAGNOSIS — R262 Difficulty in walking, not elsewhere classified: Secondary | ICD-10-CM | POA: Diagnosis not present

## 2023-08-25 DIAGNOSIS — R252 Cramp and spasm: Secondary | ICD-10-CM

## 2023-08-25 NOTE — Therapy (Signed)
OUTPATIENT PHYSICAL THERAPY TREATMENT   Patient Name: [name] MRN: [id number] DOB:03/17/1951, 72 y.o., female Today's Date: 08/25/2023  END OF SESSION:  PT End of Session - 08/25/23 1151     Visit Number 4    Date for PT Re-Evaluation 10/06/23    Authorization Type [employer] Medicare and [employer] for [employer]    PT Start Time 1149    PT Stop Time 1230    PT Time Calculation (min) 41 min    Activity Tolerance Patient tolerated treatment well    Behavior During Therapy WFL for tasks assessed/performed              Past Medical History:  Diagnosis Date   Cancer (HCC)    Basal cell carcinoma of the nose.   Gastroparesis    Hyperlipidemia    Hypertension    Osteopenia    Past Surgical History:  Procedure Laterality Date   CATARACT EXTRACTION Bilateral    Patient Active Problem List   Diagnosis Date Noted   DDD (degenerative disc disease), cervical 07/25/2023   Popliteus tendinitis of right lower extremity 11/25/2022   Dyslipidemia, goal to be determined 02/10/2017   Essential hypertension 02/10/2017    PCP: Clayborne Dana, NP   REFERRING PROVIDER: Bedelia Person, MD  REFERRING DIAG: [phone] (ICD-10-CM) - Radiculopathy, cervical region   THERAPY DIAG:  Cervicalgia  Cramp and spasm  Rationale for Evaluation and Treatment: Rehabilitation  ONSET DATE: chronic, worsened September 2024.   SUBJECTIVE:                                                                                                                                                                                                         SUBJECTIVE STATEMENT: Doing well "no complaints"  Neck pain is getting better but tingling in R hand today.  Got X-ray results from neck finally on Friday.    Hand dominance: Right  PERTINENT HISTORY:  Degenerative disk disease, HTN, osteopenia  PAIN:  Are you having pain? Yes: NPRS scale: 3-4/10 Pain location: R side of neck Pain description:  throbbing Aggravating factors: lifitng overhead, lifting weights, cold Relieving factors: heat, sometimes cold  PRECAUTIONS: None  RED FLAGS: None     WEIGHT BEARING RESTRICTIONS: No  FALLS:  Has patient fallen in last 6 months? No  LIVING ENVIRONMENT: Lives with: lives alone Lives in: House/apartment Stairs: No Has following equipment at home: None  OCCUPATION: retired  PLOF: Independent  PATIENT GOALS: get all this stiffness and pain out of my neck   NEXT MD VISIT: none  scheduled, will scheduled follow-up after MRI results back   OBJECTIVE:   DIAGNOSTIC FINDINGS:  New MR completed 08/10/23  07/30/23 XR Cervical spine Degenerative disc disease noted with disc space narrowing and marginal osteophytes at C5-6. Interfacetal joint space narrowing and  sclerosis identified from the C3-4 level through C7-T1 bilaterally.   11/23/2015 MR cervical IMPRESSION: 1. Severe multilevel cervical facet arthrosis. Mild facet edema on the left at C2-3. 2. Advanced disc space height loss at C5-6 without stenosis. 3. Mild cervical disc degeneration elsewhere with mild neural foraminal narrowing as above. No spinal stenosis. PATIENT SURVEYS:  NDI 14/50  COGNITION: Overall cognitive status: Within functional limits for tasks assessed  SENSATION: WFL  POSTURE: No Significant postural limitations  PALPATION: Tenderness/tightness bil UT/LS R>L   CERVICAL ROM:   Active ROM A/PROM (deg) eval  Flexion 20p!  Extension 10p!  Right lateral flexion 20p!  Left lateral flexion 22p!  Right rotation 45p!  Left rotation 40p!   (Blank rows = not tested)  UPPER EXTREMITY ROM:  WNL, symmetric and pain free   UPPER EXTREMITY MMT:  MMT Right eval Left eval  Shoulder flexion 5 5  Shoulder extension 5 5  Shoulder abduction 5 5  Shoulder internal rotation 5 5  Shoulder external rotation 5 5  Elbow flexion 5 5  Elbow extension 5 5  Wrist flexion 5 5  Wrist extension 5 5   Grip  strength 30lb 25lb   (Blank rows = not tested)  CERVICAL SPECIAL TESTS:  Spurling's test: Negative and Distraction test: Negative   TODAY'S TREATMENT:                                                                                                                              DATE:   08/25/2023 Therapeutic Exercise: to improve strength and mobility.  Demo, verbal and tactile cues throughout for technique. UBE forward L1 x 6 min forward Cervical snags with pillow case into extension Open book stretches x 10 bil  Manual Therapy: to decrease muscle spasm and pain and improve mobility STM/TPR to R UT, levator scapulae, cervical paraspinals, UPA mobs cervical spine grade 1-2, skilled palpation and monitoring during dry needling. Trigger Point Dry-Needling  Treatment instructions: Expect mild to moderate muscle soreness. S/S of pneumothorax if dry needled over a lung field, and to seek immediate medical attention should they occur. Patient verbalized understanding of these instructions and education. Patient Consent Given: Yes Education handout provided: previously provided Muscles treated: R UT, R L/S, bil cervical multifidi C4-6 Electrical stimulation performed: No Parameters: N/A Treatment response/outcome: Twitch Response Elicited and Palpable Increase in Muscle Length   08/18/23 Therapeutic Exercise: to improve strength and mobility.  Demo, verbal and tactile cues throughout for technique. UBE L1 x 4 min forward - pain in R UT SNAG for cervical extension 10x3"  SNAG for rotation bil 10x3" Thoracic extension arms crossed in chair 10x3" Standing ER with YTB x 10 R/L  Standing  rows YTB x 10   Manual Therapy: to decrease muscle spasm and pain and improve mobility STM/TPR to R UT, levator scapulae, cervical paraspinals, suboccipital release  08/13/23 Therapeutic Exercise: to improve strength and mobility.  Demo, verbal and tactile cues throughout for technique. UBE L1 x  forward Self snags with pillow case - extension x 10, rotation x 5 each side Levator, UT and Scalene stretch each side Manual Therapy: to decrease muscle spasm and pain and improve mobility STM/TPR to bil UT, levator scapulae, cervical paraspinals, UPA/PA mobs cervical spine grade 1-2, NAGs into rotation, gentle cervical traction, skilled palpation and monitoring during dry needling. Trigger Point Dry-Needling  Treatment instructions: Expect mild to moderate muscle soreness. S/S of pneumothorax if dry needled over a lung field, and to seek immediate medical attention should they occur. Patient verbalized understanding of these instructions and education. Patient Consent Given: Yes Education handout provided: Yes Muscles treated: R UT, R L/S, bil cervical multifidi C5 Electrical stimulation performed: No Parameters: N/A Treatment response/outcome: Twitch Response Elicited and Palpable Increase in Muscle Length  PATIENT EDUCATION:  Education details: HEP update- open book stretch Person educated: Patient Education method: Explanation, Demonstration, Verbal cues, and Handouts Education comprehension: verbalized understanding and returned demonstration  HOME EXERCISE PROGRAM: Access Code: [email or web address] URL: [email or web address] Date: 08/25/2023 Prepared by: Harrie Foreman  Exercises - Seated Shoulder Rolls  - 3 x daily - 7 x weekly - 1 sets - 10 reps - Seated Scapular Retraction  - 3 x daily - 7 x weekly - 1 sets - 10 reps - Seated Cervical Retraction  - 3 x daily - 7 x weekly - 1 sets - 10 reps - Seated Cervical Rotation AROM  - 3 x daily - 7 x weekly - 1 sets - 10 reps - Cervical Extension AROM with Strap  - 1 x daily - 7 x weekly - 1 sets - 10 reps - Seated Assisted Cervical Rotation with Towel  - 1 x daily - 7 x weekly - 1 sets - 10 reps - Seated Cervical Sidebending AROM  - 1 x daily - 7 x weekly - 1 sets - 3 reps - 10-30sec hold - Gentle Levator Scapulae Stretch  - 1  x daily - 7 x weekly - 1 sets - 3 reps - 10-30sec hold - Shoulder External Rotation with Anchored Resistance  - 1 x daily - 7 x weekly - 2 sets - 10 reps - Shoulder extension with resistance - Neutral  - 1 x daily - 7 x weekly - 2 sets - 10 reps - Sidelying Open Book Thoracic Lumbar Rotation and Extension  - 1 x daily - 7 x weekly - 1 sets - 10 reps  ASSESSMENT:  CLINICAL IMPRESSION: [name] is making good progress, reporting less pain and spasm, although still having some radicular symptoms into R arm.  Tolerating therex well, added open book stretches well, added to HEP, then again performed TrDN along with MT, with strong twitch response in R levator, decreased tightness/pain following.   [name] continues to demonstrate potential for improvement and would benefit from continued skilled therapy to address impairments.    OBJECTIVE IMPAIRMENTS: decreased activity tolerance, decreased ROM, hypomobility, increased fascial restrictions, impaired perceived functional ability, increased muscle spasms, and pain.   ACTIVITY LIMITATIONS: carrying, lifting, bending, sleeping, transfers, bed mobility, and reach over head  PARTICIPATION LIMITATIONS: meal prep, cleaning, laundry, driving, shopping, community activity, and yard work  PERSONAL FACTORS: Past/current experiences, Time since  onset of injury/illness/exacerbation, and 1-2 comorbidities: Degenerative disk disease, HTN, osteopenia  are also affecting patient's functional outcome.   REHAB POTENTIAL: Good  CLINICAL DECISION MAKING: Stable/uncomplicated  EVALUATION COMPLEXITY: Low   GOALS: Goals reviewed with patient? Yes  SHORT TERM GOALS: Target date: 08/25/2023   Patient will be independent with initial HEP.  Baseline:  Goal status: MET 08/13/23  LONG TERM GOALS: Target date: 10/06/2023   Patient will be independent with advanced/ongoing HEP to improve outcomes and carryover.  Baseline:  Goal status: IN  PROGRESS  2.  Patient will report 75% improvement in neck pain to improve QOL.  Baseline:  Goal status: IN PROGRESS  3.  Patient will demonstrate full pain free cervical ROM for safety with driving.  Baseline: see objective Goal status: IN PROGRESS  4.  Patient will report at least 8 points improvement on NDI to demonstrate improved functional ability.  Baseline: 14/50 Goal status: IN PROGRESS  PLAN:  PT FREQUENCY: 1-2x/week  PT DURATION: 8 weeks  PLANNED INTERVENTIONS: 97110-Therapeutic exercises, 97530- Therapeutic activity, 97112- Neuromuscular re-education, 97535- Self Care, 16109- Manual therapy, 97014- Electrical stimulation (unattended), 97035- Ultrasound, 60454- Traction (mechanical), Taping, Dry Needling, Joint mobilization, Joint manipulation, Spinal manipulation, Spinal mobilization, Cryotherapy, and Moist heat  PLAN FOR NEXT SESSION: continue to progress exercises - scapular and postural strengthening, manual therapy, modalities PRN.   May also try cervical traction for muscle relaxation.    Jena Gauss, PT 08/25/2023, 12:44 PM

## 2023-08-27 ENCOUNTER — Encounter: Payer: Self-pay | Admitting: Physical Therapy

## 2023-08-27 ENCOUNTER — Other Ambulatory Visit (HOSPITAL_BASED_OUTPATIENT_CLINIC_OR_DEPARTMENT_OTHER): Payer: Self-pay

## 2023-08-27 ENCOUNTER — Ambulatory Visit: Payer: Medicare Other | Admitting: Physical Therapy

## 2023-08-27 DIAGNOSIS — R252 Cramp and spasm: Secondary | ICD-10-CM

## 2023-08-27 DIAGNOSIS — M542 Cervicalgia: Secondary | ICD-10-CM

## 2023-08-27 DIAGNOSIS — R262 Difficulty in walking, not elsewhere classified: Secondary | ICD-10-CM | POA: Diagnosis not present

## 2023-08-27 MED ORDER — INFLUENZA VAC A&B SURF ANT ADJ 0.5 ML IM SUSY
0.5000 mL | PREFILLED_SYRINGE | Freq: Once | INTRAMUSCULAR | 0 refills | Status: AC
  Filled 2023-08-27: qty 0.5, 1d supply, fill #0

## 2023-08-27 MED ORDER — COVID-19 MRNA VAC-TRIS(PFIZER) 30 MCG/0.3ML IM SUSY
0.3000 mL | PREFILLED_SYRINGE | Freq: Once | INTRAMUSCULAR | 0 refills | Status: AC
Start: 2023-08-27 — End: 2023-08-28
  Filled 2023-08-27: qty 0.3, 1d supply, fill #0

## 2023-08-27 NOTE — Therapy (Signed)
OUTPATIENT PHYSICAL THERAPY TREATMENT   Patient Name: [name] MRN: [id number] DOB:08/27/1951, 72 y.o., female Today's Date: 08/27/2023  END OF SESSION:  PT End of Session - 08/27/23 1107     Visit Number 5    Date for PT Re-Evaluation 10/06/23    Authorization Type [employer] Medicare and Tricare for Life    PT Start Time 1103    PT Stop Time 1135    PT Time Calculation (min) 32 min    Activity Tolerance Patient tolerated treatment well    Behavior During Therapy WFL for tasks assessed/performed              Past Medical History:  Diagnosis Date   Cancer (HCC)    Basal cell carcinoma of the nose.   Gastroparesis    Hyperlipidemia    Hypertension    Osteopenia    Past Surgical History:  Procedure Laterality Date   CATARACT EXTRACTION Bilateral    Patient Active Problem List   Diagnosis Date Noted   DDD (degenerative disc disease), cervical 07/25/2023   Popliteus tendinitis of right lower extremity 11/25/2022   Dyslipidemia, goal to be determined 02/10/2017   Essential hypertension 02/10/2017    PCP: Clayborne Dana, NP   REFERRING PROVIDER: Bedelia Person, MD  REFERRING DIAG: [phone] (ICD-10-CM) - Radiculopathy, cervical region   THERAPY DIAG:  Cervicalgia  Cramp and spasm  Rationale for Evaluation and Treatment: Rehabilitation  ONSET DATE: chronic, worsened September 2024.   SUBJECTIVE:                                                                                                                                                                                                         SUBJECTIVE STATEMENT: No pain at all today.  Just go her flu and covid shots downstairs.   Hand dominance: Right  PERTINENT HISTORY:  Degenerative disk disease, HTN, osteopenia  PAIN:  Are you having pain? Yes: NPRS scale: 0/10 Pain location: R side of neck Pain description: throbbing Aggravating factors: lifitng overhead, lifting weights, cold Relieving  factors: heat, sometimes cold  PRECAUTIONS: None  RED FLAGS: None     WEIGHT BEARING RESTRICTIONS: No  FALLS:  Has patient fallen in last 6 months? No  LIVING ENVIRONMENT: Lives with: lives alone Lives in: House/apartment Stairs: No Has following equipment at home: None  OCCUPATION: retired  PLOF: Independent  PATIENT GOALS: get all this stiffness and pain out of my neck   NEXT MD VISIT: none scheduled, will scheduled follow-up after MRI results back   OBJECTIVE:  DIAGNOSTIC FINDINGS:  New MR completed 08/10/23  07/30/23 XR Cervical spine Degenerative disc disease noted with disc space narrowing and marginal osteophytes at C5-6. Interfacetal joint space narrowing and  sclerosis identified from the C3-4 level through C7-T1 bilaterally.   11/23/2015 MR cervical IMPRESSION: 1. Severe multilevel cervical facet arthrosis. Mild facet edema on the left at C2-3. 2. Advanced disc space height loss at C5-6 without stenosis. 3. Mild cervical disc degeneration elsewhere with mild neural foraminal narrowing as above. No spinal stenosis. PATIENT SURVEYS:  NDI 14/50  COGNITION: Overall cognitive status: Within functional limits for tasks assessed  SENSATION: WFL  POSTURE: No Significant postural limitations  PALPATION: Tenderness/tightness bil UT/LS R>L   CERVICAL ROM:   Active ROM A/PROM (deg) eval  Flexion 20p!  Extension 10p!  Right lateral flexion 20p!  Left lateral flexion 22p!  Right rotation 45p!  Left rotation 40p!   (Blank rows = not tested)  UPPER EXTREMITY ROM:  WNL, symmetric and pain free   UPPER EXTREMITY MMT:  MMT Right eval Left eval  Shoulder flexion 5 5  Shoulder extension 5 5  Shoulder abduction 5 5  Shoulder internal rotation 5 5  Shoulder external rotation 5 5  Elbow flexion 5 5  Elbow extension 5 5  Wrist flexion 5 5  Wrist extension 5 5   Grip strength 30lb 25lb   (Blank rows = not tested)  CERVICAL SPECIAL TESTS:   Spurling's test: Negative and Distraction test: Negative   TODAY'S TREATMENT:                                                                                                                              DATE:  08/27/2023 Therapeutic Exercise: to improve strength and mobility.  Demo, verbal and tactile cues throughout for technique. UBE L2 forward x 3 min  Manual Therapy: to decrease muscle spasm and pain and improve mobility STM/TPR to cervical paraspinals, bil UT, levator scapulae, suboccipital release,  PA mobs, NAGs into rotations, MFR bil UT, pect stretch Modalities: MHP to cervical spine x 10 min (not included in treatment time).    08/25/2023 Therapeutic Exercise: to improve strength and mobility.  Demo, verbal and tactile cues throughout for technique. UBE forward L1 x 6 min forward Cervical snags with pillow case into extension Open book stretches x 10 bil  Manual Therapy: to decrease muscle spasm and pain and improve mobility STM/TPR to R UT, levator scapulae, cervical paraspinals, UPA mobs cervical spine grade 1-2, skilled palpation and monitoring during dry needling. Trigger Point Dry-Needling  Treatment instructions: Expect mild to moderate muscle soreness. S/S of pneumothorax if dry needled over a lung field, and to seek immediate medical attention should they occur. Patient verbalized understanding of these instructions and education. Patient Consent Given: Yes Education handout provided: previously provided Muscles treated: R UT, R L/S, bil cervical multifidi C4-6 Electrical stimulation performed: No Parameters: N/A Treatment response/outcome: Twitch Response Elicited and Palpable Increase in Muscle  Length   08/18/23 Therapeutic Exercise: to improve strength and mobility.  Demo, verbal and tactile cues throughout for technique. UBE L1 x 4 min forward - pain in R UT SNAG for cervical extension 10x3"  SNAG for rotation bil 10x3" Thoracic extension arms crossed in chair  10x3" Standing ER with YTB x 10 R/L  Standing rows YTB x 10   Manual Therapy: to decrease muscle spasm and pain and improve mobility STM/TPR to R UT, levator scapulae, cervical paraspinals, suboccipital release  PATIENT EDUCATION:  Education details: continue HEP as tolerated Person educated: Patient Education method: Explanation Education comprehension: verbalized understanding  HOME EXERCISE PROGRAM: Access Code: [email or web address] URL: [email or web address] Date: 08/25/2023 Prepared by: Harrie Foreman  Exercises - Seated Shoulder Rolls  - 3 x daily - 7 x weekly - 1 sets - 10 reps - Seated Scapular Retraction  - 3 x daily - 7 x weekly - 1 sets - 10 reps - Seated Cervical Retraction  - 3 x daily - 7 x weekly - 1 sets - 10 reps - Seated Cervical Rotation AROM  - 3 x daily - 7 x weekly - 1 sets - 10 reps - Cervical Extension AROM with Strap  - 1 x daily - 7 x weekly - 1 sets - 10 reps - Seated Assisted Cervical Rotation with Towel  - 1 x daily - 7 x weekly - 1 sets - 10 reps - Seated Cervical Sidebending AROM  - 1 x daily - 7 x weekly - 1 sets - 3 reps - 10-30sec hold - Gentle Levator Scapulae Stretch  - 1 x daily - 7 x weekly - 1 sets - 3 reps - 10-30sec hold - Shoulder External Rotation with Anchored Resistance  - 1 x daily - 7 x weekly - 2 sets - 10 reps - Shoulder extension with resistance - Neutral  - 1 x daily - 7 x weekly - 2 sets - 10 reps - Sidelying Open Book Thoracic Lumbar Rotation and Extension  - 1 x daily - 7 x weekly - 1 sets - 10 reps  ASSESSMENT:  CLINICAL IMPRESSION: ADINA PUZZO reported no pain at all today.  Since she just received vaccines, did not focus on therex today to avoid irritating shoulders and causing increased soreness, focusing on manual therapy instead as still has significant tightness in cervical musculature, followed by MHP for muscle relaxation.  DAMYA COMLEY continues to demonstrate potential for improvement and would  benefit from continued skilled therapy to address impairments.    OBJECTIVE IMPAIRMENTS: decreased activity tolerance, decreased ROM, hypomobility, increased fascial restrictions, impaired perceived functional ability, increased muscle spasms, and pain.   ACTIVITY LIMITATIONS: carrying, lifting, bending, sleeping, transfers, bed mobility, and reach over head  PARTICIPATION LIMITATIONS: meal prep, cleaning, laundry, driving, shopping, community activity, and yard work  PERSONAL FACTORS: Past/current experiences, Time since onset of injury/illness/exacerbation, and 1-2 comorbidities: Degenerative disk disease, HTN, osteopenia  are also affecting patient's functional outcome.   REHAB POTENTIAL: Good  CLINICAL DECISION MAKING: Stable/uncomplicated  EVALUATION COMPLEXITY: Low   GOALS: Goals reviewed with patient? Yes  SHORT TERM GOALS: Target date: 08/25/2023   Patient will be independent with initial HEP.  Baseline:  Goal status: MET 08/13/23  LONG TERM GOALS: Target date: 10/06/2023   Patient will be independent with advanced/ongoing HEP to improve outcomes and carryover.  Baseline:  Goal status: IN PROGRESS  2.  Patient will report 75% improvement in neck pain to improve QOL.  Baseline:  Goal status:  IN PROGRESS 08/27/23- no pain today  3.  Patient will demonstrate full pain free cervical ROM for safety with driving.  Baseline: see objective Goal status: IN PROGRESS  4.  Patient will report at least 8 points improvement on NDI to demonstrate improved functional ability.  Baseline: 14/50 Goal status: IN PROGRESS  PLAN:  PT FREQUENCY: 1-2x/week  PT DURATION: 8 weeks  PLANNED INTERVENTIONS: 97110-Therapeutic exercises, 97530- Therapeutic activity, 97112- Neuromuscular re-education, 97535- Self Care, 16109- Manual therapy, 97014- Electrical stimulation (unattended), 97035- Ultrasound, 60454- Traction (mechanical), Taping, Dry Needling, Joint mobilization, Joint manipulation,  Spinal manipulation, Spinal mobilization, Cryotherapy, and Moist heat  PLAN FOR NEXT SESSION: continue to progress exercises - scapular and postural strengthening, manual therapy, modalities PRN.   May also try cervical traction for muscle relaxation.    Jena Gauss, PT 08/27/2023, 11:50 AM

## 2023-09-01 ENCOUNTER — Ambulatory Visit: Payer: Medicare Other | Admitting: Physical Therapy

## 2023-09-01 ENCOUNTER — Encounter: Payer: Self-pay | Admitting: Physical Therapy

## 2023-09-01 DIAGNOSIS — M542 Cervicalgia: Secondary | ICD-10-CM | POA: Diagnosis not present

## 2023-09-01 DIAGNOSIS — R252 Cramp and spasm: Secondary | ICD-10-CM

## 2023-09-01 DIAGNOSIS — R262 Difficulty in walking, not elsewhere classified: Secondary | ICD-10-CM | POA: Diagnosis not present

## 2023-09-01 NOTE — Therapy (Signed)
OUTPATIENT PHYSICAL THERAPY TREATMENT   Patient Name: [name] MRN: [id number] DOB:November 03, 1950, 72 y.o., female Today's Date: 09/01/2023  END OF SESSION:  PT End of Session - 09/01/23 1152     Visit Number 6    Date for PT Re-Evaluation 10/06/23    Authorization Type [employer] Medicare and [employer] for [employer]    PT Start Time 1152    PT Stop Time 1233    PT Time Calculation (min) 41 min    Activity Tolerance Patient tolerated treatment well    Behavior During Therapy WFL for tasks assessed/performed              Past Medical History:  Diagnosis Date   Cancer (HCC)    Basal cell carcinoma of the nose.   Gastroparesis    Hyperlipidemia    Hypertension    Osteopenia    Past Surgical History:  Procedure Laterality Date   CATARACT EXTRACTION Bilateral    Patient Active Problem List   Diagnosis Date Noted   DDD (degenerative disc disease), cervical 07/25/2023   Popliteus tendinitis of right lower extremity 11/25/2022   Dyslipidemia, goal to be determined 02/10/2017   Essential hypertension 02/10/2017    PCP: Clayborne Dana, NP   REFERRING PROVIDER: Bedelia Person, MD  REFERRING DIAG: [phone] (ICD-10-CM) - Radiculopathy, cervical region   THERAPY DIAG:  Cervicalgia  Cramp and spasm  Rationale for Evaluation and Treatment: Rehabilitation  ONSET DATE: chronic, worsened September 2024.   SUBJECTIVE:                                                                                                                                                                                                         SUBJECTIVE STATEMENT: No pain, muscles just tight.   Hand dominance: Right  PERTINENT HISTORY:  Degenerative disk disease, HTN, osteopenia  PAIN:  Are you having pain? Yes: NPRS scale: 0/10 Pain location: R side of neck Pain description: throbbing Aggravating factors: lifitng overhead, lifting weights, cold Relieving factors: heat, sometimes  cold  PRECAUTIONS: None  RED FLAGS: None     WEIGHT BEARING RESTRICTIONS: No  FALLS:  Has patient fallen in last 6 months? No  LIVING ENVIRONMENT: Lives with: lives alone Lives in: House/apartment Stairs: No Has following equipment at home: None  OCCUPATION: retired  PLOF: Independent  PATIENT GOALS: get all this stiffness and pain out of my neck   NEXT MD VISIT: none scheduled, will scheduled follow-up after MRI results back   OBJECTIVE:   DIAGNOSTIC FINDINGS:  New MR completed 08/10/23  07/30/23 XR Cervical spine Degenerative disc disease noted with disc space narrowing and marginal osteophytes at C5-6. Interfacetal joint space narrowing and  sclerosis identified from the C3-4 level through C7-T1 bilaterally.   11/23/2015 MR cervical IMPRESSION: 1. Severe multilevel cervical facet arthrosis. Mild facet edema on the left at C2-3. 2. Advanced disc space height loss at C5-6 without stenosis. 3. Mild cervical disc degeneration elsewhere with mild neural foraminal narrowing as above. No spinal stenosis. PATIENT SURVEYS:  NDI 14/50  COGNITION: Overall cognitive status: Within functional limits for tasks assessed  SENSATION: WFL  POSTURE: No Significant postural limitations  PALPATION: Tenderness/tightness bil UT/LS R>L   CERVICAL ROM:   Active ROM A/PROM (deg) eval  Flexion 20p!  Extension 10p!  Right lateral flexion 20p!  Left lateral flexion 22p!  Right rotation 45p!  Left rotation 40p!   (Blank rows = not tested)  UPPER EXTREMITY ROM:  WNL, symmetric and pain free   UPPER EXTREMITY MMT:  MMT Right eval Left eval  Shoulder flexion 5 5  Shoulder extension 5 5  Shoulder abduction 5 5  Shoulder internal rotation 5 5  Shoulder external rotation 5 5  Elbow flexion 5 5  Elbow extension 5 5  Wrist flexion 5 5  Wrist extension 5 5   Grip strength 30lb 25lb   (Blank rows = not tested)  CERVICAL SPECIAL TESTS:  Spurling's test: Negative and  Distraction test: Negative   TODAY'S TREATMENT:                                                                                                                              DATE:  09/01/2023 Therapeutic Exercise: to improve strength and mobility.  Demo, verbal and tactile cues throughout for technique. UBE L2 x 6 min forward On pool noodle Back stroke Thoracic mobilization Seated I, Y, T's with 2# weight - noted some increased tightness in UT changed to  Prone I, Y , T's with 2# weight - no discomfort Manual Therapy: to decrease muscle spasm and pain and improve mobility STM/TPR to bil UT   08/27/2023 Therapeutic Exercise: to improve strength and mobility.  Demo, verbal and tactile cues throughout for technique. UBE L2 forward x 3 min  Manual Therapy: to decrease muscle spasm and pain and improve mobility STM/TPR to cervical paraspinals, bil UT, levator scapulae, suboccipital release,  PA mobs, NAGs into rotations, MFR bil UT, pect stretch Modalities: MHP to cervical spine x 10 min (not included in treatment time).    08/25/2023 Therapeutic Exercise: to improve strength and mobility.  Demo, verbal and tactile cues throughout for technique. UBE forward L1 x 6 min forward Cervical snags with pillow case into extension Open book stretches x 10 bil  Manual Therapy: to decrease muscle spasm and pain and improve mobility STM/TPR to R UT, levator scapulae, cervical paraspinals, UPA mobs cervical spine grade 1-2, skilled palpation and monitoring during dry needling. Trigger Point Dry-Needling  Treatment instructions: Expect  mild to moderate muscle soreness. S/S of pneumothorax if dry needled over a lung field, and to seek immediate medical attention should they occur. Patient verbalized understanding of these instructions and education. Patient Consent Given: Yes Education handout provided: previously provided Muscles treated: R UT, R L/S, bil cervical multifidi C4-6 Electrical stimulation  performed: No Parameters: N/A Treatment response/outcome: Twitch Response Elicited and Palpable Increase in Muscle Length  PATIENT EDUCATION:  Education details: HEP update Person educated: Patient Education method: Explanation, Demonstration, Verbal cues, and Handouts Education comprehension: verbalized understanding and returned demonstration  HOME EXERCISE PROGRAM: Access Code: [email or web address] URL: [email or web address] Date: 09/01/2023 Prepared by: Harrie Foreman  Exercises - Seated Shoulder Rolls  - 3 x daily - 7 x weekly - 1 sets - 10 reps - Seated Scapular Retraction  - 3 x daily - 7 x weekly - 1 sets - 10 reps - Seated Cervical Retraction  - 3 x daily - 7 x weekly - 1 sets - 10 reps - Seated Cervical Rotation AROM  - 3 x daily - 7 x weekly - 1 sets - 10 reps - Cervical Extension AROM with Strap  - 1 x daily - 7 x weekly - 1 sets - 10 reps - Seated Assisted Cervical Rotation with Towel  - 1 x daily - 7 x weekly - 1 sets - 10 reps - Seated Cervical Sidebending AROM  - 1 x daily - 7 x weekly - 1 sets - 3 reps - 10-30sec hold - Gentle Levator Scapulae Stretch  - 1 x daily - 7 x weekly - 1 sets - 3 reps - 10-30sec hold - Shoulder External Rotation with Anchored Resistance  - 1 x daily - 7 x weekly - 2 sets - 10 reps - Shoulder extension with resistance - Neutral  - 1 x daily - 7 x weekly - 2 sets - 10 reps - Sidelying Open Book Thoracic Lumbar Rotation and Extension  - 1 x daily - 7 x weekly - 1 sets - 10 reps - Prone Shoulder Horizontal Abduction with External Rotation  - 1 x daily - 7 x weekly - 2-3 sets - 10 reps - Prone Single Arm Shoulder Y  - 1 x daily - 7 x weekly - 2-3 sets - 10 reps - Prone Shoulder Extension - Single Arm  - 1 x daily - 7 x weekly - 2-3 sets - 10 reps - Supine Thoracic Mobilization Towel Roll Vertical with Arm Stretch  - 1 x daily - 7 x weekly - 1 sets - Thoracic Foam Roll Mobilization Backstroke  - 1 x daily - 7 x weekly - 1 sets - 10 reps -  Open Book Chest Rotation Stretch on Foam 1/2 Roll  - 1 x daily - 7 x weekly - 1 sets - 10 reps - [place] on Foam Roll  - 1 x daily - 7 x weekly - 1 sets - 10 reps  ASSESSMENT:  CLINICAL IMPRESSION: LYA ZAPPONE reports no pain again today, just tightness.  She brought up muscle relaxors, discussed pro's and con's.  Continued to progress HE adding more exercises for thoracic mobility and posterior shoulder strengthening which she tolerated well.   ELANI WAKEFORD continues to demonstrate potential for improvement and would benefit from continued skilled therapy to address impairments.    OBJECTIVE IMPAIRMENTS: decreased activity tolerance, decreased ROM, hypomobility, increased fascial restrictions, impaired perceived functional ability, increased muscle spasms, and pain.   ACTIVITY LIMITATIONS: carrying, lifting, bending, sleeping, transfers, bed  mobility, and reach over head  PARTICIPATION LIMITATIONS: meal prep, cleaning, laundry, driving, shopping, community activity, and yard work  PERSONAL FACTORS: Past/current experiences, Time since onset of injury/illness/exacerbation, and 1-2 comorbidities: Degenerative disk disease, HTN, osteopenia  are also affecting patient's functional outcome.   REHAB POTENTIAL: Good  CLINICAL DECISION MAKING: Stable/uncomplicated  EVALUATION COMPLEXITY: Low   GOALS: Goals reviewed with patient? Yes  SHORT TERM GOALS: Target date: 08/25/2023   Patient will be independent with initial HEP.  Baseline:  Goal status: MET 08/13/23  LONG TERM GOALS: Target date: 10/06/2023   Patient will be independent with advanced/ongoing HEP to improve outcomes and carryover.  Baseline:  Goal status: IN PROGRESS 09/01/23- updated   2.  Patient will report 75% improvement in neck pain to improve QOL.  Baseline:  Goal status: IN PROGRESS 08/27/23- no pain today  3.  Patient will demonstrate full pain free cervical ROM for safety with driving.  Baseline:  see objective Goal status: IN PROGRESS  4.  Patient will report at least 8 points improvement on NDI to demonstrate improved functional ability.  Baseline: 14/50 Goal status: IN PROGRESS  PLAN:  PT FREQUENCY: 1-2x/week  PT DURATION: 8 weeks  PLANNED INTERVENTIONS: 97110-Therapeutic exercises, 97530- Therapeutic activity, 97112- Neuromuscular re-education, 97535- Self Care, [id number]- Manual therapy, 97014- Electrical stimulation (unattended), 97035- Ultrasound, 25956- Traction (mechanical), Taping, Dry Needling, Joint mobilization, Joint manipulation, Spinal manipulation, Spinal mobilization, Cryotherapy, and Moist heat  PLAN FOR NEXT SESSION: review HEP, manual PRN.  Check cervical ROM.     Jena Gauss, PT 09/01/2023, 12:40 PM

## 2023-09-04 ENCOUNTER — Ambulatory Visit: Payer: Medicare Other

## 2023-09-04 DIAGNOSIS — M542 Cervicalgia: Secondary | ICD-10-CM | POA: Diagnosis not present

## 2023-09-04 DIAGNOSIS — R252 Cramp and spasm: Secondary | ICD-10-CM | POA: Diagnosis not present

## 2023-09-04 DIAGNOSIS — R262 Difficulty in walking, not elsewhere classified: Secondary | ICD-10-CM | POA: Diagnosis not present

## 2023-09-04 NOTE — Therapy (Signed)
OUTPATIENT PHYSICAL THERAPY TREATMENT   Patient Name: [name] MRN: [id number] DOB:[birth year], 72 y.o., female Today's Date: 09/04/2023  END OF SESSION:  PT End of Session - 09/04/23 1235     Visit Number 7    Date for PT Re-Evaluation 10/06/23    Authorization Type [employer] Medicare and Tric[employer] for Life    PT Start Time 1148    PT Stop Time 1226    PT Time Calculation (min) 38 min    Activity Tolerance Patient tolerated treatment well    Behavior During Therapy WFL for tasks assessed/performed               Past Medical History:  Diagnosis Date   Cancer (HCC)    Basal cell carcinoma of the nose.   Gastroparesis    Hyperlipidemia    Hypertension    Osteopenia    Past Surgical History:  Procedure Laterality Date   CATARACT EXTRACTION Bilateral    Patient Active Problem List   Diagnosis Date Noted   DDD (degenerative disc disease), cervical 07/25/2023   Popliteus tendinitis of right lower extremity 11/25/2022   Dyslipidemia, goal to be determined 02/10/2017   Essential hypertension 02/10/2017    PCP: Clayborne Dana, NP   REFERRING PROVIDER: Bedelia Person, MD  REFERRING DIAG: [phone] (ICD-10-CM) - Radiculopathy, cervical region   THERAPY DIAG:  Cervicalgia  Cramp and spasm  Difficulty in walking, not elsewhere classified  Rationale for Evaluation and Treatment: Rehabilitation  ONSET DATE: chronic, worsened September 2024.   SUBJECTIVE:                                                                                                                                                                                                         SUBJECTIVE STATEMENT: Pt reports that she is sore and tight in her upper traps. Hand dominance: Right  PERTINENT HISTORY:  Degenerative disk disease, HTN, osteopenia  PAIN:  Are you having pain? Yes: NPRS scale: 3/10 Pain location: R side of neck Pain description: throbbing Aggravating factors: lifitng  overhead, lifting weights, cold Relieving factors: heat, sometimes cold  PRECAUTIONS: None  RED FLAGS: None     WEIGHT BEARING RESTRICTIONS: No  FALLS:  Has patient fallen in last 6 months? No  LIVING ENVIRONMENT: Lives with: lives alone Lives in: House/apartment Stairs: No Has following equipment at home: None  OCCUPATION: retired  PLOF: Independent  PATIENT GOALS: get all this stiffness and pain out of my neck   NEXT MD VISIT: none scheduled, will scheduled follow-up after MRI results back  OBJECTIVE:   DIAGNOSTIC FINDINGS:  New MR completed 08/10/23  07/30/23 XR Cervical spine Degenerative disc disease noted with disc space narrowing and marginal osteophytes at C5-6. Interfacetal joint space narrowing and  sclerosis identified from the C3-4 level through C7-T1 bilaterally.   11/23/2015 MR cervical IMPRESSION: 1. Severe multilevel cervical facet arthrosis. Mild facet edema on the left at C2-3. 2. Advanced disc space height loss at C5-6 without stenosis. 3. Mild cervical disc degeneration elsewhere with mild neural foraminal narrowing as above. No spinal stenosis. PATIENT SURVEYS:  NDI 14/50  COGNITION: Overall cognitive status: Within functional limits for tasks assessed  SENSATION: WFL  POSTURE: No Significant postural limitations  PALPATION: Tenderness/tightness bil UT/LS R>L   CERVICAL ROM:   Active ROM A/PROM (deg) eval  Flexion 20p!  Extension 10p!  Right lateral flexion 20p!  Left lateral flexion 22p!  Right rotation 45p!  Left rotation 40p!   (Blank rows = not tested)  UPPER EXTREMITY ROM:  WNL, symmetric and pain free   UPPER EXTREMITY MMT:  MMT Right eval Left eval  Shoulder flexion 5 5  Shoulder extension 5 5  Shoulder abduction 5 5  Shoulder internal rotation 5 5  Shoulder external rotation 5 5  Elbow flexion 5 5  Elbow extension 5 5  Wrist flexion 5 5  Wrist extension 5 5   Grip strength 30lb 25lb   (Blank rows = not  tested)  CERVICAL SPECIAL TESTS:  Spurling's test: Negative and Distraction test: Negative   TODAY'S TREATMENT:                                                                                                                              DATE:  09/04/23 Therapeutic Exercise: to improve strength and mobility.  Demo, verbal and tactile cues throughout for technique. NDI: 14 / 50 = 28.0 %  UBE L2 x 6 min forward SNAG for cervical ext 10x3" SNAG for bil rotation x 10 bil Horizontal ABD YTB x 10  Shoulder extension YTB x 10   Manual Therapy: to decrease muscle spasm and pain and improve mobility STM/TPR to bil UT, levator  09/01/2023 Therapeutic Exercise: to improve strength and mobility.  Demo, verbal and tactile cues throughout for technique. UBE L2 x 6 min forward On pool noodle Back stroke Thoracic mobilization Seated I, Y, T's with 2# weight - noted some increased tightness in UT changed to  Prone I, Y , T's with 2# weight - no discomfort Manual Therapy: to decrease muscle spasm and pain and improve mobility STM/TPR to bil UT   08/27/2023 Therapeutic Exercise: to improve strength and mobility.  Demo, verbal and tactile cues throughout for technique. UBE L2 forward x 3 min  Manual Therapy: to decrease muscle spasm and pain and improve mobility STM/TPR to cervical paraspinals, bil UT, levator scapulae, suboccipital release,  PA mobs, NAGs into rotations, MFR bil UT, pect stretch Modalities: MHP to cervical spine x 10 min (not  included in treatment time).    08/25/2023 Therapeutic Exercise: to improve strength and mobility.  Demo, verbal and tactile cues throughout for technique. UBE forward L1 x 6 min forward Cervical snags with pillow case into extension Open book stretches x 10 bil  Manual Therapy: to decrease muscle spasm and pain and improve mobility STM/TPR to R UT, levator scapulae, cervical paraspinals, UPA mobs cervical spine grade 1-2, skilled palpation and monitoring  during dry needling. Trigger Point Dry-Needling  Treatment instructions: Expect mild to moderate muscle soreness. S/S of pneumothorax if dry needled over a lung field, and to seek immediate medical attention should they occur. Patient verbalized understanding of these instructions and education. Patient Consent Given: Yes Education handout provided: previously provided Muscles treated: R UT, R L/S, bil cervical multifidi C4-6 Electrical stimulation performed: No Parameters: N/A Treatment response/outcome: Twitch Response Elicited and Palpable Increase in Muscle Length  PATIENT EDUCATION:  Education details: HEP update Person educated: Patient Education method: Explanation, Demonstration, Verbal cues, and Handouts Education comprehension: verbalized understanding and returned demonstration  HOME EXERCISE PROGRAM: Access Code: [email or web address] URL: [email or web address] Date: 09/01/2023 Prepared by: Harrie Foreman  Exercises - Seated Shoulder Rolls  - 3 x daily - 7 x weekly - 1 sets - 10 reps - Seated Scapular Retraction  - 3 x daily - 7 x weekly - 1 sets - 10 reps - Seated Cervical Retraction  - 3 x daily - 7 x weekly - 1 sets - 10 reps - Seated Cervical Rotation AROM  - 3 x daily - 7 x weekly - 1 sets - 10 reps - Cervical Extension AROM with Strap  - 1 x daily - 7 x weekly - 1 sets - 10 reps - Seated Assisted Cervical Rotation with Towel  - 1 x daily - 7 x weekly - 1 sets - 10 reps - Seated Cervical Sidebending AROM  - 1 x daily - 7 x weekly - 1 sets - 3 reps - 10-30sec hold - Gentle Levator Scapulae Stretch  - 1 x daily - 7 x weekly - 1 sets - 3 reps - 10-30sec hold - Shoulder External Rotation with Anchored Resistance  - 1 x daily - 7 x weekly - 2 sets - 10 reps - Shoulder extension with resistance - Neutral  - 1 x daily - 7 x weekly - 2 sets - 10 reps - Sidelying Open Book Thoracic Lumbar Rotation and Extension  - 1 x daily - 7 x weekly - 1 sets - 10 reps - Prone  Shoulder Horizontal Abduction with External Rotation  - 1 x daily - 7 x weekly - 2-3 sets - 10 reps - Prone Single Arm Shoulder Y  - 1 x daily - 7 x weekly - 2-3 sets - 10 reps - Prone Shoulder Extension - Single Arm  - 1 x daily - 7 x weekly - 2-3 sets - 10 reps - Supine Thoracic Mobilization Towel Roll Vertical with Arm Stretch  - 1 x daily - 7 x weekly - 1 sets - Thoracic Foam Roll Mobilization Backstroke  - 1 x daily - 7 x weekly - 1 sets - 10 reps - Open Book Chest Rotation Stretch on Foam 1/2 Roll  - 1 x daily - 7 x weekly - 1 sets - 10 reps - [place] on Foam Roll  - 1 x daily - 7 x weekly - 1 sets - 10 reps  ASSESSMENT:  CLINICAL IMPRESSION: Pt responded well to treatment. Continued with postural strengthening  and cervical ROM to tolerance. Emphasized scap retraction and depression throughout session as she tends to hike shoulders with strengthening exercise. She remains tight in the UT and LS on both sides.  [name] continues to demonstrate potential for improvement and would benefit from continued skilled therapy to address impairments.    OBJECTIVE IMPAIRMENTS: decreased activity tolerance, decreased ROM, hypomobility, increased fascial restrictions, impaired perceived functional ability, increased muscle spasms, and pain.   ACTIVITY LIMITATIONS: carrying, lifting, bending, sleeping, transfers, bed mobility, and reach over head  PARTICIPATION LIMITATIONS: meal prep, cleaning, laundry, driving, shopping, community activity, and yard work  PERSONAL FACTORS: Past/current experiences, Time since onset of injury/illness/exacerbation, and 1-2 comorbidities: Degenerative disk disease, HTN, osteopenia  are also affecting patient's functional outcome.   REHAB POTENTIAL: Good  CLINICAL DECISION MAKING: Stable/uncomplicated  EVALUATION COMPLEXITY: Low   GOALS: Goals reviewed with patient? Yes  SHORT TERM GOALS: Target date: 08/25/2023   Patient will be independent with  initial HEP.  Baseline:  Goal status: MET 08/13/23  LONG TERM GOALS: Target date: 10/06/2023   Patient will be independent with advanced/ongoing HEP to improve outcomes and carryover.  Baseline:  Goal status: IN PROGRESS 09/01/23- updated   2.  Patient will report 75% improvement in neck pain to improve QOL.  Baseline:  Goal status: IN PROGRESS 08/27/23- no pain today  3.  Patient will demonstrate full pain free cervical ROM for safety with driving.  Baseline: see objective Goal status: IN PROGRESS  4.  Patient will report at least 8 points improvement on NDI to demonstrate improved functional ability.  Baseline: 14/50 Goal status: IN PROGRESS  PLAN:  PT FREQUENCY: 1-2x/week  PT DURATION: 8 weeks  PLANNED INTERVENTIONS: 97110-Therapeutic exercises, 97530- Therapeutic activity, 97112- Neuromuscular re-education, 97535- Self Care, 16109- Manual therapy, 97014- Electrical stimulation (unattended), 97035- Ultrasound, 60454- Traction (mechanical), Taping, Dry Needling, Joint mobilization, Joint manipulation, Spinal manipulation, Spinal mobilization, Cryotherapy, and Moist heat  PLAN FOR NEXT SESSION: review HEP, manual PRN.  Check cervical ROM.     Darleene Cleaver, PTA 09/04/2023, 12:39 PM

## 2023-09-08 ENCOUNTER — Encounter: Payer: Self-pay | Admitting: Physical Therapy

## 2023-09-08 ENCOUNTER — Ambulatory Visit: Payer: Medicare Other | Admitting: Physical Therapy

## 2023-09-08 DIAGNOSIS — M542 Cervicalgia: Secondary | ICD-10-CM

## 2023-09-08 DIAGNOSIS — R252 Cramp and spasm: Secondary | ICD-10-CM

## 2023-09-08 DIAGNOSIS — R262 Difficulty in walking, not elsewhere classified: Secondary | ICD-10-CM | POA: Diagnosis not present

## 2023-09-08 NOTE — Therapy (Signed)
OUTPATIENT PHYSICAL THERAPY TREATMENT Progress Note Reporting Period 08/11/23 to 09/08/23  See note below for Objective Data and Assessment of Progress/Goals.      Patient Name: [name] MRN: [id number] DOB:1951/02/15, 72 y.o., female Today's Date: 09/08/2023  END OF SESSION:  PT End of Session - 09/08/23 1107     Visit Number 8    Date for PT Re-Evaluation 10/06/23    Authorization Type [employer] Medicare and [employer] for [employer]    PT Start Time 1105    PT Stop Time 1147    PT Time Calculation (min) 42 min    Activity Tolerance Patient tolerated treatment well    Behavior During Therapy WFL for tasks assessed/performed               Past Medical History:  Diagnosis Date   Cancer (HCC)    Basal cell carcinoma of the nose.   Gastroparesis    Hyperlipidemia    Hypertension    Osteopenia    Past Surgical History:  Procedure Laterality Date   CATARACT EXTRACTION Bilateral    Patient Active Problem List   Diagnosis Date Noted   DDD (degenerative disc disease), cervical 07/25/2023   Popliteus tendinitis of right lower extremity 11/25/2022   Dyslipidemia, goal to be determined 02/10/2017   Essential hypertension 02/10/2017    PCP: Clayborne Dana, NP   REFERRING PROVIDER: Bedelia Person, MD  REFERRING DIAG: [phone] (ICD-10-CM) - Radiculopathy, cervical region   THERAPY DIAG:  Cervicalgia  Cramp and spasm  Rationale for Evaluation and Treatment: Rehabilitation  ONSET DATE: chronic, worsened September 2024.   SUBJECTIVE:                                                                                                                                                                                                         SUBJECTIVE STATEMENT: Woke up stiff but took a hot shower and was good.  Hand dominance: Right  PERTINENT HISTORY:  Degenerative disk disease, HTN, osteopenia  PAIN:  Are you having pain? Yes: NPRS scale: 0/10 Pain location: R  side of neck Pain description: throbbing Aggravating factors: lifitng overhead, lifting weights, cold Relieving factors: heat, sometimes cold  PRECAUTIONS: None  RED FLAGS: None     WEIGHT BEARING RESTRICTIONS: No  FALLS:  Has patient fallen in last 6 months? No  LIVING ENVIRONMENT: Lives with: lives alone Lives in: House/apartment Stairs: No Has following equipment at home: None  OCCUPATION: retired  PLOF: Independent  PATIENT GOALS: get all this stiffness and pain out of my neck  NEXT MD VISIT: none scheduled, will scheduled follow-up after MRI results back   OBJECTIVE:   DIAGNOSTIC FINDINGS:  New MR completed 08/10/23  07/30/23 XR Cervical spine Degenerative disc disease noted with disc space narrowing and marginal osteophytes at C5-6. Interfacetal joint space narrowing and  sclerosis identified from the C3-4 level through C7-T1 bilaterally.   11/23/2015 MR cervical IMPRESSION: 1. Severe multilevel cervical facet arthrosis. Mild facet edema on the left at C2-3. 2. Advanced disc space height loss at C5-6 without stenosis. 3. Mild cervical disc degeneration elsewhere with mild neural foraminal narrowing as above. No spinal stenosis. PATIENT SURVEYS:  NDI 14/50  COGNITION: Overall cognitive status: Within functional limits for tasks assessed  SENSATION: WFL  POSTURE: No Significant postural limitations  PALPATION: Tenderness/tightness bil UT/LS R>L   CERVICAL ROM:   Active ROM A/PROM (deg) eval AROM 09/08/2023  Flexion 20p! 30  Extension 10p! 20p!  Right lateral flexion 20p! 12  Left lateral flexion 22p! 25  Right rotation 45p! 40p!  Left rotation 40p! 45   (Blank rows = not tested)  UPPER EXTREMITY ROM:  WNL, symmetric and pain free   UPPER EXTREMITY MMT:  MMT Right eval Left eval  Shoulder flexion 5 5  Shoulder extension 5 5  Shoulder abduction 5 5  Shoulder internal rotation 5 5  Shoulder external rotation 5 5  Elbow flexion 5 5   Elbow extension 5 5  Wrist flexion 5 5  Wrist extension 5 5   Grip strength 30lb 25lb   (Blank rows = not tested)  CERVICAL SPECIAL TESTS:  Spurling's test: Negative and Distraction test: Negative   TODAY'S TREATMENT:                                                                                                                              DATE:   09/08/2023 Therapeutic Exercise: to improve strength and mobility.  Demo, verbal and tactile cues throughout for technique. Nustep L5 x 8 min  Rows 20# x 10  Cervical ROM Manual Therapy: to decrease muscle spasm and pain and improve mobility STM/TPR cervical paraspinals, bil UT, levator scapulae, suboccipital release,  PA mobs, NAGs into rotations in supine hooklying  09/04/23 Therapeutic Exercise: to improve strength and mobility.  Demo, verbal and tactile cues throughout for technique. NDI: 14 / 50 = 28.0 %  UBE L2 x 6 min forward SNAG for cervical ext 10x3" SNAG for bil rotation x 10 bil Horizontal ABD YTB x 10  Shoulder extension YTB x 10   Manual Therapy: to decrease muscle spasm and pain and improve mobility STM/TPR to bil UT, levator  09/01/2023 Therapeutic Exercise: to improve strength and mobility.  Demo, verbal and tactile cues throughout for technique. UBE L2 x 6 min forward On pool noodle Back stroke Thoracic mobilization Seated I, Y, T's with 2# weight - noted some increased tightness in UT changed to  Prone I, Y , T's with 2# weight - no  discomfort Manual Therapy: to decrease muscle spasm and pain and improve mobility STM/TPR to bil UT   PATIENT EDUCATION:  Education details: HEP review Person educated: Patient Education method: Programmer, multimedia, Demonstration, Verbal cues, and Handouts Education comprehension: verbalized understanding and returned demonstration  HOME EXERCISE PROGRAM: Access Code: [email or web address] URL: [email or web address] Date: 09/01/2023 Prepared by: Harrie Foreman  Exercises - Seated Shoulder Rolls  - 3 x daily - 7 x weekly - 1 sets - 10 reps - Seated Scapular Retraction  - 3 x daily - 7 x weekly - 1 sets - 10 reps - Seated Cervical Retraction  - 3 x daily - 7 x weekly - 1 sets - 10 reps - Seated Cervical Rotation AROM  - 3 x daily - 7 x weekly - 1 sets - 10 reps - Cervical Extension AROM with Strap  - 1 x daily - 7 x weekly - 1 sets - 10 reps - Seated Assisted Cervical Rotation with Towel  - 1 x daily - 7 x weekly - 1 sets - 10 reps - Seated Cervical Sidebending AROM  - 1 x daily - 7 x weekly - 1 sets - 3 reps - 10-30sec hold - Gentle Levator Scapulae Stretch  - 1 x daily - 7 x weekly - 1 sets - 3 reps - 10-30sec hold - Shoulder External Rotation with Anchored Resistance  - 1 x daily - 7 x weekly - 2 sets - 10 reps - Shoulder extension with resistance - Neutral  - 1 x daily - 7 x weekly - 2 sets - 10 reps - Sidelying Open Book Thoracic Lumbar Rotation and Extension  - 1 x daily - 7 x weekly - 1 sets - 10 reps - Prone Shoulder Horizontal Abduction with External Rotation  - 1 x daily - 7 x weekly - 2-3 sets - 10 reps - Prone Single Arm Shoulder Y  - 1 x daily - 7 x weekly - 2-3 sets - 10 reps - Prone Shoulder Extension - Single Arm  - 1 x daily - 7 x weekly - 2-3 sets - 10 reps - Supine Thoracic Mobilization Towel Roll Vertical with Arm Stretch  - 1 x daily - 7 x weekly - 1 sets - Thoracic Foam Roll Mobilization Backstroke  - 1 x daily - 7 x weekly - 1 sets - 10 reps - Open Book Chest Rotation Stretch on Foam 1/2 Roll  - 1 x daily - 7 x weekly - 1 sets - 10 reps - Snow Angels on Foam Roll  - 1 x daily - 7 x weekly - 1 sets - 10 reps  ASSESSMENT:  CLINICAL IMPRESSION: [name] reports 50% improvement, although NDI has not changed.  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  Continues to reports more tightness than pain, although with  cervical AROM did reports pain at end range today.  Discussed different resistance equipment uses at gym which is all appropriate for postural strengthening, does not cause neck pain and demonstrated good form with rows today.  Focused today on manual therapy to cervical region to improve mobility, tolerated well but noted a lot of muscel tightness.  [name] continues to demonstrate potential for improvement and would benefit from continued skilled therapy to address impairments.    OBJECTIVE IMPAIRMENTS: decreased activity tolerance, decreased ROM, hypomobility, increased fascial restrictions, impaired perceived functional ability, increased muscle spasms, and  pain.   ACTIVITY LIMITATIONS: carrying, lifting, bending, sleeping, transfers, bed mobility, and reach over head  PARTICIPATION LIMITATIONS: meal prep, cleaning, laundry, driving, shopping, community activity, and yard work  PERSONAL FACTORS: Past/current experiences, Time since onset of injury/illness/exacerbation, and 1-2 comorbidities: Degenerative disk disease, HTN, osteopenia  are also affecting patient's functional outcome.   REHAB POTENTIAL: Good  CLINICAL DECISION MAKING: Stable/uncomplicated  EVALUATION COMPLEXITY: Low   GOALS: Goals reviewed with patient? Yes  SHORT TERM GOALS: Target date: 08/25/2023   Patient will be independent with initial HEP.  Baseline:  Goal status: MET 08/13/23  LONG TERM GOALS: Target date: 10/06/2023   Patient will be independent with advanced/ongoing HEP to improve outcomes and carryover.  Baseline:  Goal status: IN PROGRESS 09/01/23- updated   2.  Patient will report 75% improvement in neck pain to improve QOL.  Baseline:  Goal status: IN PROGRESS 09/08/23 - 50% improvement   3.  Patient will demonstrate full pain free cervical ROM for safety with driving.  Baseline: see objective Goal status: IN PROGRESS 09/08/23 - see objective, still limited and some pain  4.  Patient  will report at least 8 points improvement on NDI to demonstrate improved functional ability.  Baseline: 14/50 Goal status: IN PROGRESS 09/04/23 14/50 no change.   PLAN:  PT FREQUENCY: 1-2x/week  PT DURATION: 8 weeks  PLANNED INTERVENTIONS: 97110-Therapeutic exercises, 97530- Therapeutic activity, [id number]- Neuromuscular re-education, 97535- Self Care, 16109- Manual therapy, 97014- Electrical stimulation (unattended), 97035- Ultrasound, 60454- Traction (mechanical), Taping, Dry Needling, Joint mobilization, Joint manipulation, Spinal manipulation, Spinal mobilization, Cryotherapy, and Moist heat  PLAN FOR NEXT SESSION: review HEP, manual PRN.  Check cervical ROM.     Jena Gauss, PT 09/08/2023, 11:53 AM

## 2023-09-12 DIAGNOSIS — M47812 Spondylosis without myelopathy or radiculopathy, cervical region: Secondary | ICD-10-CM | POA: Diagnosis not present

## 2023-09-23 ENCOUNTER — Encounter: Payer: Self-pay | Admitting: Physical Therapy

## 2023-09-23 ENCOUNTER — Ambulatory Visit: Payer: Medicare Other | Attending: Neurosurgery | Admitting: Physical Therapy

## 2023-09-23 DIAGNOSIS — M542 Cervicalgia: Secondary | ICD-10-CM | POA: Insufficient documentation

## 2023-09-23 DIAGNOSIS — R252 Cramp and spasm: Secondary | ICD-10-CM | POA: Diagnosis not present

## 2023-09-23 NOTE — Therapy (Signed)
OUTPATIENT PHYSICAL THERAPY TREATMENT  Patient Name: [name] MRN: [id number] DOB:1951/02/08, 72 y.o., female Today's Date: 09/23/2023  END OF SESSION:  PT End of Session - 09/23/23 1104     Visit Number 9    Date for PT Re-Evaluation 10/06/23    Authorization Type [employer] Medicare and [employer] for Life    PT Start Time 1104    PT Stop Time 1145    PT Time Calculation (min) 41 min    Activity Tolerance Patient tolerated treatment well    Behavior During Therapy WFL for tasks assessed/performed               Past Medical History:  Diagnosis Date   Cancer (HCC)    Basal cell carcinoma of the nose.   Gastroparesis    Hyperlipidemia    Hypertension    Osteopenia    Past Surgical History:  Procedure Laterality Date   CATARACT EXTRACTION Bilateral    Patient Active Problem List   Diagnosis Date Noted   DDD (degenerative disc disease), cervical 07/25/2023   Popliteus tendinitis of right lower extremity 11/25/2022   Dyslipidemia, goal to be determined 02/10/2017   Essential hypertension 02/10/2017    PCP: Clayborne Dana, NP   REFERRING PROVIDER: Bedelia Person, MD  REFERRING DIAG: [phone] (ICD-10-CM) - Radiculopathy, cervical region   THERAPY DIAG:  Cervicalgia  Cramp and spasm  Rationale for Evaluation and Treatment: Rehabilitation  ONSET DATE: chronic, worsened September 2024.   SUBJECTIVE:                                                                                                                                                                                                         SUBJECTIVE STATEMENT: Hurts because its cold, but I have things for that.  Use volaten cream on muscles.   MD told her she would always be stiff, not much could do other than surgery or referral to pain management, she declined.  Hand dominance: Right  PERTINENT HISTORY:  Degenerative disk disease, HTN, osteopenia  PAIN:  Are you having pain? Yes: NPRS scale:  4/10 Pain location: sides of neck Pain description: throbbing Aggravating factors: lifitng overhead, lifting weights, cold Relieving factors: heat, sometimes cold  PRECAUTIONS: None  RED FLAGS: None     WEIGHT BEARING RESTRICTIONS: No  FALLS:  Has patient fallen in last 6 months? No  LIVING ENVIRONMENT: Lives with: lives alone Lives in: House/apartment Stairs: No Has following equipment at home: None  OCCUPATION: retired  PLOF: Independent  PATIENT GOALS: get all this stiffness  and pain out of my neck   NEXT MD VISIT: none scheduled, will scheduled follow-up after MRI results back   OBJECTIVE:   DIAGNOSTIC FINDINGS:  New MR completed 08/10/23  07/30/23 XR Cervical spine Degenerative disc disease noted with disc space narrowing and marginal osteophytes at C5-6. Interfacetal joint space narrowing and  sclerosis identified from the C3-4 level through C7-T1 bilaterally.   11/23/2015 MR cervical IMPRESSION: 1. Severe multilevel cervical facet arthrosis. Mild facet edema on the left at C2-3. 2. Advanced disc space height loss at C5-6 without stenosis. 3. Mild cervical disc degeneration elsewhere with mild neural foraminal narrowing as above. No spinal stenosis. PATIENT SURVEYS:  NDI 14/50  COGNITION: Overall cognitive status: Within functional limits for tasks assessed  SENSATION: WFL  POSTURE: No Significant postural limitations  PALPATION: Tenderness/tightness bil UT/LS R>L   CERVICAL ROM:   Active ROM A/PROM (deg) eval AROM 09/08/2023  Flexion 20p! 30  Extension 10p! 20p!  Right lateral flexion 20p! 12  Left lateral flexion 22p! 25  Right rotation 45p! 40p!  Left rotation 40p! 45   (Blank rows = not tested)  UPPER EXTREMITY ROM:  WNL, symmetric and pain free   UPPER EXTREMITY MMT:  MMT Right eval Left eval  Shoulder flexion 5 5  Shoulder extension 5 5  Shoulder abduction 5 5  Shoulder internal rotation 5 5  Shoulder external rotation 5 5   Elbow flexion 5 5  Elbow extension 5 5  Wrist flexion 5 5  Wrist extension 5 5   Grip strength 30lb 25lb   (Blank rows = not tested)  CERVICAL SPECIAL TESTS:  Spurling's test: Negative and Distraction test: Negative   TODAY'S TREATMENT:                                                                                                                              DATE:     09/23/23 Therapeutic Exercise: to improve strength and mobility.  Demo, verbal and tactile cues throughout for technique. UBE L1 x 5 min  Supine on pool noodle Back stroke x 20 for thoracic mobilization Pec stretch Snow angels Thoracic mobilization S/L Open books x 10 R/L Manual Therapy: to decrease muscle spasm and pain and improve mobility STM/TPR cervical paraspinals, bil UT, levator scapulae, suboccipital release,  PA mobs, NAGs into rotations in supine hooklying   09/08/2023 Therapeutic Exercise: to improve strength and mobility.  Demo, verbal and tactile cues throughout for technique. Nustep L5 x 8 min  Rows 20# x 10  Cervical ROM Manual Therapy: to decrease muscle spasm and pain and improve mobility STM/TPR cervical paraspinals, bil UT, levator scapulae, suboccipital release,  PA mobs, NAGs into rotations in supine hooklying  09/04/23 Therapeutic Exercise: to improve strength and mobility.  Demo, verbal and tactile cues throughout for technique. NDI: 14 / 50 = 28.0 %  UBE L2 x 6 min forward SNAG for cervical ext 10x3" SNAG for bil rotation x 10 bil  Horizontal ABD YTB x 10  Shoulder extension YTB x 10   Manual Therapy: to decrease muscle spasm and pain and improve mobility STM/TPR to bil UT, levator   PATIENT EDUCATION:  Education details: HEP review Person educated: Patient Education method: Programmer, multimedia, Demonstration, Verbal cues, and Handouts Education comprehension: verbalized understanding and returned demonstration  HOME EXERCISE PROGRAM: Access Code: [email or web address] URL:  [email or web address] Date: 09/01/2023 Prepared by: Harrie Foreman  Exercises - Seated Shoulder Rolls  - 3 x daily - 7 x weekly - 1 sets - 10 reps - Seated Scapular Retraction  - 3 x daily - 7 x weekly - 1 sets - 10 reps - Seated Cervical Retraction  - 3 x daily - 7 x weekly - 1 sets - 10 reps - Seated Cervical Rotation AROM  - 3 x daily - 7 x weekly - 1 sets - 10 reps - Cervical Extension AROM with Strap  - 1 x daily - 7 x weekly - 1 sets - 10 reps - Seated Assisted Cervical Rotation with Towel  - 1 x daily - 7 x weekly - 1 sets - 10 reps - Seated Cervical Sidebending AROM  - 1 x daily - 7 x weekly - 1 sets - 3 reps - 10-30sec hold - Gentle Levator Scapulae Stretch  - 1 x daily - 7 x weekly - 1 sets - 3 reps - 10-30sec hold - Shoulder External Rotation with Anchored Resistance  - 1 x daily - 7 x weekly - 2 sets - 10 reps - Shoulder extension with resistance - Neutral  - 1 x daily - 7 x weekly - 2 sets - 10 reps - Sidelying Open Book Thoracic Lumbar Rotation and Extension  - 1 x daily - 7 x weekly - 1 sets - 10 reps - Prone Shoulder Horizontal Abduction with External Rotation  - 1 x daily - 7 x weekly - 2-3 sets - 10 reps - Prone Single Arm Shoulder Y  - 1 x daily - 7 x weekly - 2-3 sets - 10 reps - Prone Shoulder Extension - Single Arm  - 1 x daily - 7 x weekly - 2-3 sets - 10 reps - Supine Thoracic Mobilization Towel Roll Vertical with Arm Stretch  - 1 x daily - 7 x weekly - 1 sets - Thoracic Foam Roll Mobilization Backstroke  - 1 x daily - 7 x weekly - 1 sets - 10 reps - Open Book Chest Rotation Stretch on Foam 1/2 Roll  - 1 x daily - 7 x weekly - 1 sets - 10 reps - [place] Angels on Foam Roll  - 1 x daily - 7 x weekly - 1 sets - 10 reps  ASSESSMENT:  CLINICAL IMPRESSION: [name] reports mostly tightness in neck and some pain in UT.  Continued to focus on strengthening/mobilization for upper back, reported decreased tightness following, followed by manual  therapy to neck afterwards.   [name] continues to demonstrate potential for improvement and would benefit from continued skilled therapy to address impairments.    OBJECTIVE IMPAIRMENTS: decreased activity tolerance, decreased ROM, hypomobility, increased fascial restrictions, impaired perceived functional ability, increased muscle spasms, and pain.   ACTIVITY LIMITATIONS: carrying, lifting, bending, sleeping, transfers, bed mobility, and reach over head  PARTICIPATION LIMITATIONS: meal prep, cleaning, laundry, driving, shopping, community activity, and yard work  PERSONAL FACTORS: Past/current experiences, Time since onset of injury/illness/exacerbation, and 1-2 comorbidities: Degenerative disk disease, HTN, osteopenia  are also affecting patient's functional outcome.  REHAB POTENTIAL: Good  CLINICAL DECISION MAKING: Stable/uncomplicated  EVALUATION COMPLEXITY: Low   GOALS: Goals reviewed with patient? Yes  SHORT TERM GOALS: Target date: 08/25/2023   Patient will be independent with initial HEP.  Baseline:  Goal status: MET 08/13/23  LONG TERM GOALS: Target date: 10/06/2023   Patient will be independent with advanced/ongoing HEP to improve outcomes and carryover.  Baseline:  Goal status: IN PROGRESS 09/01/23- updated   2.  Patient will report 75% improvement in neck pain to improve QOL.  Baseline:  Goal status: IN PROGRESS 09/08/23 - 50% improvement   3.  Patient will demonstrate full pain free cervical ROM for safety with driving.  Baseline: see objective Goal status: IN PROGRESS 09/08/23 - see objective, still limited and some pain  4.  Patient will report at least 8 points improvement on NDI to demonstrate improved functional ability.  Baseline: 14/50 Goal status: IN PROGRESS 09/04/23 14/50 no change.   PLAN:  PT FREQUENCY: 1-2x/week  PT DURATION: 8 weeks  PLANNED INTERVENTIONS: 97110-Therapeutic exercises, 97530- Therapeutic activity, [id number]  Neuromuscular re-education, 97535- Self Care, 69629- Manual therapy, 97014- Electrical stimulation (unattended), 97035- Ultrasound, 52841- Traction (mechanical), Taping, Dry Needling, Joint mobilization, Joint manipulation, Spinal manipulation, Spinal mobilization, Cryotherapy, and Moist heat  PLAN FOR NEXT SESSION: review HEP, manual PRN.  Check cervical ROM.     Jena Gauss, PT 09/23/2023, 1:09 PM

## 2023-09-25 ENCOUNTER — Encounter: Payer: Self-pay | Admitting: Physical Therapy

## 2023-09-25 ENCOUNTER — Ambulatory Visit: Payer: Medicare Other | Admitting: Physical Therapy

## 2023-09-25 DIAGNOSIS — R252 Cramp and spasm: Secondary | ICD-10-CM | POA: Diagnosis not present

## 2023-09-25 DIAGNOSIS — M542 Cervicalgia: Secondary | ICD-10-CM

## 2023-09-25 NOTE — Therapy (Signed)
OUTPATIENT PHYSICAL THERAPY TREATMENT  Patient Name: [name] MRN: [id number] DOB:[birth year], 72 y.o., female Today's Date: 09/25/2023  END OF SESSION:  PT End of Session - 09/25/23 1404     Visit Number 10    Date for PT Re-Evaluation 10/06/23    Authorization Type [employer] Medicare and [employer]    PT Start Time 1403    Activity Tolerance Patient tolerated treatment well    Behavior During Therapy [facility] for tasks assessed/performed               Past Medical History:  Diagnosis Date   Cancer (HCC)    Basal cell carcinoma of the nose.   Gastroparesis    Hyperlipidemia    Hypertension    Osteopenia    Past Surgical History:  Procedure Laterality Date   CATARACT EXTRACTION Bilateral    Patient Active Problem List   Diagnosis Date Noted   DDD (degenerative disc disease), cervical 07/25/2023   Popliteus tendinitis of right lower extremity 11/25/2022   Dyslipidemia, goal to be determined 02/10/2017   Essential hypertension 02/10/2017    PCP: Clayborne Dana, NP   REFERRING PROVIDER: Bedelia Person, MD  REFERRING DIAG: [phone] (ICD-10-CM) - Radiculopathy, cervical region   THERAPY DIAG:  Cervicalgia  Cramp and spasm  Rationale for Evaluation and Treatment: Rehabilitation  ONSET DATE: chronic, worsened September 2024.   SUBJECTIVE:                                                                                                                                                                                                         SUBJECTIVE STATEMENT: Doing well, neck feels good.   Hand dominance: Right  PERTINENT HISTORY:  Degenerative disk disease, HTN, osteopenia  PAIN:  Are you having pain? Yes: NPRS scale: 0/10 Pain location: sides of neck Pain description: throbbing Aggravating factors: lifitng overhead, lifting weights, cold Relieving factors: heat, sometimes cold  PRECAUTIONS: None  RED FLAGS: None     WEIGHT BEARING  RESTRICTIONS: No  FALLS:  Has patient fallen in last 6 months? No  LIVING ENVIRONMENT: Lives with: lives alone Lives in: House/apartment Stairs: No Has following equipment at home: None  OCCUPATION: retired  PLOF: Independent  PATIENT GOALS: get all this stiffness and pain out of my neck   NEXT MD VISIT: none scheduled, will scheduled follow-up after MRI results back   OBJECTIVE:   DIAGNOSTIC FINDINGS:  New MR completed 08/10/23  07/30/23 XR Cervical spine Degenerative disc disease noted with disc space narrowing and marginal osteophytes at C5-6.  Interfacetal joint space narrowing and  sclerosis identified from the C3-4 level through C7-T1 bilaterally.   11/23/2015 MR cervical IMPRESSION: 1. Severe multilevel cervical facet arthrosis. Mild facet edema on the left at C2-3. 2. Advanced disc space height loss at C5-6 without stenosis. 3. Mild cervical disc degeneration elsewhere with mild neural foraminal narrowing as above. No spinal stenosis. PATIENT SURVEYS:  NDI 14/50  COGNITION: Overall cognitive status: Within functional limits for tasks assessed  SENSATION: WFL  POSTURE: No Significant postural limitations  PALPATION: Tenderness/tightness bil UT/LS R>L   CERVICAL ROM:   Active ROM A/PROM (deg) eval AROM 09/08/2023 AROM 09/25/23  Flexion 20p! 30 30  Extension 10p! 20p! 20  Right lateral flexion 20p! 12   Left lateral flexion 22p! 25   Right rotation 45p! 40p! 45  Left rotation 40p! 45 50   (Blank rows = not tested)  UPPER EXTREMITY ROM:  WNL, symmetric and pain free   UPPER EXTREMITY MMT:  MMT Right eval Left eval  Shoulder flexion 5 5  Shoulder extension 5 5  Shoulder abduction 5 5  Shoulder internal rotation 5 5  Shoulder external rotation 5 5  Elbow flexion 5 5  Elbow extension 5 5  Wrist flexion 5 5  Wrist extension 5 5   Grip strength 30lb 25lb   (Blank rows = not tested)  CERVICAL SPECIAL TESTS:  Spurling's test: Negative and  Distraction test: Negative   TODAY'S TREATMENT:                                                                                                                              DATE:   09/25/23 Therapeutic Exercise: to improve strength and mobility.  Demo, verbal and tactile cues throughout for technique. UBE L2 x 5 min  Lat pulls seated 15# x 15, 20# x 15 Rows 20#  2 x 15  Chest press 15# 2 x 10  Open books on wall x 10 r/l Wall angels x 10 Wall squats with foam roller x 10  On swiss ball - with close SBA and next to plinth for safety -seated march, seated pelvic circles, Seated LAQ, -sit ups  09/23/23 Therapeutic Exercise: to improve strength and mobility.  Demo, verbal and tactile cues throughout for technique. UBE L1 x 5 min  Supine on pool noodle Back stroke x 20 for thoracic mobilization Pec stretch Snow angels Thoracic mobilization S/L Open books x 10 R/L Manual Therapy: to decrease muscle spasm and pain and improve mobility STM/TPR cervical paraspinals, bil UT, levator scapulae, suboccipital release,  PA mobs, NAGs into rotations in supine hooklying   09/08/2023 Therapeutic Exercise: to improve strength and mobility.  Demo, verbal and tactile cues throughout for technique. Nustep L5 x 8 min  Rows 20# x 10  Cervical ROM Manual Therapy: to decrease muscle spasm and pain and improve mobility STM/TPR cervical paraspinals, bil UT, levator scapulae, suboccipital release,  PA mobs, NAGs into rotations in supine hooklying  09/04/23 Therapeutic Exercise: to improve strength and mobility.  Demo, verbal and tactile cues throughout for technique. NDI: 14 / 50 = 28.0 %  UBE L2 x 6 min forward SNAG for cervical ext 10x3" SNAG for bil rotation x 10 bil Horizontal ABD YTB x 10  Shoulder extension YTB x 10   Manual Therapy: to decrease muscle spasm and pain and improve mobility STM/TPR to bil UT, levator   PATIENT EDUCATION:  Education details: HEP review Person educated:  Patient Education method: Programmer, multimedia, Facilities manager, Verbal cues, and Handouts Education comprehension: verbalized understanding and returned demonstration  HOME EXERCISE PROGRAM: Access Code: [email or web address] URL: [email or web address] Date: 09/25/2023 Prepared by: Harrie Foreman  Exercises - Seated Shoulder Rolls  - 3 x daily - 7 x weekly - 1 sets - 10 reps - Seated Scapular Retraction  - 3 x daily - 7 x weekly - 1 sets - 10 reps - Seated Cervical Retraction  - 3 x daily - 7 x weekly - 1 sets - 10 reps - Seated Cervical Rotation AROM  - 3 x daily - 7 x weekly - 1 sets - 10 reps - Cervical Extension AROM with Strap  - 1 x daily - 7 x weekly - 1 sets - 10 reps - Seated Assisted Cervical Rotation with Towel  - 1 x daily - 7 x weekly - 1 sets - 10 reps - Seated Cervical Sidebending AROM  - 1 x daily - 7 x weekly - 1 sets - 3 reps - 10-30sec hold - Gentle Levator Scapulae Stretch  - 1 x daily - 7 x weekly - 1 sets - 3 reps - 10-30sec hold - Shoulder External Rotation with Anchored Resistance  - 1 x daily - 7 x weekly - 2 sets - 10 reps - Shoulder extension with resistance - Neutral  - 1 x daily - 7 x weekly - 2 sets - 10 reps - Sidelying Open Book Thoracic Lumbar Rotation and Extension  - 1 x daily - 7 x weekly - 1 sets - 10 reps - Prone Shoulder Horizontal Abduction with External Rotation  - 1 x daily - 7 x weekly - 2-3 sets - 10 reps - Prone Single Arm Shoulder Y  - 1 x daily - 7 x weekly - 2-3 sets - 10 reps - Prone Shoulder Extension - Single Arm  - 1 x daily - 7 x weekly - 2-3 sets - 10 reps - Supine Thoracic Mobilization Towel Roll Vertical with Arm Stretch  - 1 x daily - 7 x weekly - 1 sets - Thoracic Foam Roll Mobilization Backstroke  - 1 x daily - 7 x weekly - 1 sets - 10 reps - Open Book Chest Rotation Stretch on Foam 1/2 Roll  - 1 x daily - 7 x weekly - 1 sets - 10 reps - Snow Angels on Foam Roll  - 1 x daily - 7 x weekly - 1 sets - 10 reps - Quad Setting and Stretching   - 1 x daily - 7 x weekly - 2 sets - 10 reps - 5 sec  hold - Supine Quad Set  - 1 x daily - 7 x weekly - 2 sets - 10 reps - 5 sec hold - Seated Long Arc Quad  - 1 x daily - 7 x weekly - 1 sets - 10 reps - 10 sec hold - Wall Push Up  - 1 x daily - 7 x weekly - 23 sets - 10 reps - Standing  Thoracic Open Book at Wall  - 1 x daily - 7 x weekly - 3 sets - 10 reps - Wall Angels  - 1 x daily - 7 x weekly - 3 sets - 10 reps - Wall Quarter Squat with Foam Roller  - 1 x daily - 7 x weekly - 3 sets - 10 reps - Swiss Ball March  - 1 x daily - 7 x weekly - 3 sets - 10 reps - Swiss Ball March and Kick  - 1 x daily - 7 x weekly - 3 sets - 10 reps - Seated March with Opposite Arm Flexion on Swiss Ball  - 1 x daily - 7 x weekly - 3 sets - 10 reps - Pelvic Circles on [employer]  - 1 x daily - 7 x weekly - 3 sets - 10 reps - [employer] Up  - 1 x daily - 7 x weekly - 3 sets - 10 reps  ASSESSMENT:  CLINICAL IMPRESSION: FREYAH HANUS reports no pain today.  Focused on progressing gym exercise, with good tolerance, only reporting some tightness with standing open books facing to Left, no pain on Right.  Updated HEP as well, adding in swiss ball exercises for core strengthening as well.  She is making good progress as has now met LTG #2 and 3 and should be able to discharge to HEP in next visit or two.    OBJECTIVE IMPAIRMENTS: decreased activity tolerance, decreased ROM, hypomobility, increased fascial restrictions, impaired perceived functional ability, increased muscle spasms, and pain.   ACTIVITY LIMITATIONS: carrying, lifting, bending, sleeping, transfers, bed mobility, and reach over head  PARTICIPATION LIMITATIONS: meal prep, cleaning, laundry, driving, shopping, community activity, and yard work  PERSONAL FACTORS: Past/current experiences, Time since onset of injury/illness/exacerbation, and 1-2 comorbidities: Degenerative disk disease, HTN, osteopenia  are also affecting patient's functional  outcome.   REHAB POTENTIAL: Good  CLINICAL DECISION MAKING: Stable/uncomplicated  EVALUATION COMPLEXITY: Low   GOALS: Goals reviewed with patient? Yes  SHORT TERM GOALS: Target date: 08/25/2023   Patient will be independent with initial HEP.  Baseline:  Goal status: MET 08/13/23  LONG TERM GOALS: Target date: 10/06/2023   Patient will be independent with advanced/ongoing HEP to improve outcomes and carryover.  Baseline:  Goal status: MET 09/01/23- updated  09/25/23 - met for current.  2.  Patient will report 75% improvement in neck pain to improve QOL.  Baseline:  Goal status: MET 09/08/23 - 50% improvement  09/28/23- 80% improvement  3.  Patient will demonstrate full pain free cervical ROM for safety with driving.  Baseline: see objective Goal status: MET 09/08/23 - see objective, still limited and some pain  09/25/23- no pain with cervical AROM, just tightness  4.  Patient will report at least 8 points improvement on NDI to demonstrate improved functional ability.  Baseline: 14/50 Goal status: IN PROGRESS 09/04/23 14/50 no change.   PLAN:  PT FREQUENCY: 1-2x/week  PT DURATION: 8 weeks  PLANNED INTERVENTIONS: 97110-Therapeutic exercises, 97530- Therapeutic activity, [id number]- Neuromuscular re-education, 97535- Self Care, [id number]- Manual therapy, 97014- Electrical stimulation (unattended), 97035- Ultrasound, 36644- Traction (mechanical), Taping, Dry Needling, Joint mobilization, Joint manipulation, Spinal manipulation, Spinal mobilization, Cryotherapy, and Moist heat  PLAN FOR NEXT SESSION: progressing towards d/c  Jena Gauss, PT 09/25/2023, 3:03 PM

## 2023-09-29 ENCOUNTER — Ambulatory Visit: Payer: Medicare Other

## 2023-09-29 DIAGNOSIS — R252 Cramp and spasm: Secondary | ICD-10-CM

## 2023-09-29 DIAGNOSIS — M542 Cervicalgia: Secondary | ICD-10-CM | POA: Diagnosis not present

## 2023-09-29 NOTE — Therapy (Addendum)
OUTPATIENT PHYSICAL THERAPY TREATMENT/Discharge Summary  Patient Name: [name] MRN: [id number] DOB:Nov 09, 1950, 72 y.o., female Today's Date: 09/29/2023  END OF SESSION: Visit # 11 Time In: 1103 Time Out: 1138 PT Time: 35 min     Past Medical History:  Diagnosis Date   Cancer (HCC)    Basal cell carcinoma of the nose.   Gastroparesis    Hyperlipidemia    Hypertension    Osteopenia    Past Surgical History:  Procedure Laterality Date   CATARACT EXTRACTION Bilateral    Patient Active Problem List   Diagnosis Date Noted   DDD (degenerative disc disease), cervical 07/25/2023   Popliteus tendinitis of right lower extremity 11/25/2022   Dyslipidemia, goal to be determined 02/10/2017   Essential hypertension 02/10/2017    PCP: Clayborne Dana, NP   REFERRING PROVIDER: Bedelia Person, MD  REFERRING DIAG: [phone] (ICD-10-CM) - Radiculopathy, cervical region   THERAPY DIAG:  Cervicalgia  Cramp and spasm  Rationale for Evaluation and Treatment: Rehabilitation  ONSET DATE: chronic, worsened September 2024.   SUBJECTIVE:                                                                                                                                                                                                         SUBJECTIVE STATEMENT: Doing well, neck feels good.   Hand dominance: Right  PERTINENT HISTORY:  Degenerative disk disease, HTN, osteopenia  PAIN:  Are you having pain? Yes: NPRS scale: 0/10 Pain location: sides of neck Pain description: throbbing Aggravating factors: lifitng overhead, lifting weights, cold Relieving factors: heat, sometimes cold  PRECAUTIONS: None  RED FLAGS: None     WEIGHT BEARING RESTRICTIONS: No  FALLS:  Has patient fallen in last 6 months? No  LIVING ENVIRONMENT: Lives with: lives alone Lives in: House/apartment Stairs: No Has following equipment at home: None  OCCUPATION: retired  PLOF:  Independent  PATIENT GOALS: get all this stiffness and pain out of my neck   NEXT MD VISIT: none scheduled, will scheduled follow-up after MRI results back   OBJECTIVE:   DIAGNOSTIC FINDINGS:  New MR completed 08/10/23  07/30/23 XR Cervical spine Degenerative disc disease noted with disc space narrowing and marginal osteophytes at C5-6. Interfacetal joint space narrowing and  sclerosis identified from the C3-4 level through C7-T1 bilaterally.   11/23/2015 MR cervical IMPRESSION: 1. Severe multilevel cervical facet arthrosis. Mild facet edema on the left at C2-3. 2. Advanced disc space height loss at C5-6 without stenosis. 3. Mild cervical disc degeneration elsewhere with mild neural foraminal narrowing  as above. No spinal stenosis. PATIENT SURVEYS:  NDI 14/50  COGNITION: Overall cognitive status: Within functional limits for tasks assessed  SENSATION: WFL  POSTURE: No Significant postural limitations  PALPATION: Tenderness/tightness bil UT/LS R>L   CERVICAL ROM:   Active ROM A/PROM (deg) eval AROM 09/08/2023 AROM 09/25/23  Flexion 20p! 30 30  Extension 10p! 20p! 20  Right lateral flexion 20p! 12   Left lateral flexion 22p! 25   Right rotation 45p! 40p! 45  Left rotation 40p! 45 50   (Blank rows = not tested)  UPPER EXTREMITY ROM:  WNL, symmetric and pain free   UPPER EXTREMITY MMT:  MMT Right eval Left eval  Shoulder flexion 5 5  Shoulder extension 5 5  Shoulder abduction 5 5  Shoulder internal rotation 5 5  Shoulder external rotation 5 5  Elbow flexion 5 5  Elbow extension 5 5  Wrist flexion 5 5  Wrist extension 5 5   Grip strength 30lb 25lb   (Blank rows = not tested)  CERVICAL SPECIAL TESTS:  Spurling's test: Negative and Distraction test: Negative   TODAY'S TREATMENT:                                                                                                                              DATE:  09/29/23 Therapeutic Exercise: to improve  strength and mobility.  Demo, verbal and tactile cues throughout for technique. UBE L2 x 5 min  Lat pulldowns 20# 2x15 Seated rows 20# 2x12 low grips Standing wall angels x 10 Standing Y with 2lb weights x 12 Standing shoulder flexion 2lb x 12   09/25/23 Therapeutic Exercise: to improve strength and mobility.  Demo, verbal and tactile cues throughout for technique. UBE L2 x 5 min  Lat pulls seated 15# x 15, 20# x 15 Rows 20#  2 x 15  Chest press 15# 2 x 10  Open books on wall x 10 r/l Wall angels x 10 Wall squats with foam roller x 10  On swiss ball - with close SBA and next to plinth for safety -seated march, seated pelvic circles, Seated LAQ, -sit ups  09/23/23 Therapeutic Exercise: to improve strength and mobility.  Demo, verbal and tactile cues throughout for technique. UBE L1 x 5 min  Supine on pool noodle Back stroke x 20 for thoracic mobilization Pec stretch Snow angels Thoracic mobilization S/L Open books x 10 R/L Manual Therapy: to decrease muscle spasm and pain and improve mobility STM/TPR cervical paraspinals, bil UT, levator scapulae, suboccipital release,  PA mobs, NAGs into rotations in supine hooklying   09/08/2023 Therapeutic Exercise: to improve strength and mobility.  Demo, verbal and tactile cues throughout for technique. Nustep L5 x 8 min  Rows 20# x 10  Cervical ROM Manual Therapy: to decrease muscle spasm and pain and improve mobility STM/TPR cervical paraspinals, bil UT, levator scapulae, suboccipital release,  PA mobs, NAGs into rotations in supine hooklying  09/04/23 Therapeutic Exercise:  to improve strength and mobility.  Demo, verbal and tactile cues throughout for technique. NDI: 14 / 50 = 28.0 %  UBE L2 x 6 min forward SNAG for cervical ext 10x3" SNAG for bil rotation x 10 bil Horizontal ABD YTB x 10  Shoulder extension YTB x 10   Manual Therapy: to decrease muscle spasm and pain and improve mobility STM/TPR to bil UT, levator   PATIENT  EDUCATION:  Education details: HEP review Person educated: Patient Education method: Programmer, multimedia, Facilities manager, Verbal cues, and Handouts Education comprehension: verbalized understanding and returned demonstration  HOME EXERCISE PROGRAM: Access Code: [email or web address] URL: [email or web address] Date: 09/25/2023 Prepared by: Harrie Foreman  Exercises - Seated Shoulder Rolls  - 3 x daily - 7 x weekly - 1 sets - 10 reps - Seated Scapular Retraction  - 3 x daily - 7 x weekly - 1 sets - 10 reps - Seated Cervical Retraction  - 3 x daily - 7 x weekly - 1 sets - 10 reps - Seated Cervical Rotation AROM  - 3 x daily - 7 x weekly - 1 sets - 10 reps - Cervical Extension AROM with Strap  - 1 x daily - 7 x weekly - 1 sets - 10 reps - Seated Assisted Cervical Rotation with Towel  - 1 x daily - 7 x weekly - 1 sets - 10 reps - Seated Cervical Sidebending AROM  - 1 x daily - 7 x weekly - 1 sets - 3 reps - 10-30sec hold - Gentle Levator Scapulae Stretch  - 1 x daily - 7 x weekly - 1 sets - 3 reps - 10-30sec hold - Shoulder External Rotation with Anchored Resistance  - 1 x daily - 7 x weekly - 2 sets - 10 reps - Shoulder extension with resistance - Neutral  - 1 x daily - 7 x weekly - 2 sets - 10 reps - Sidelying Open Book Thoracic Lumbar Rotation and Extension  - 1 x daily - 7 x weekly - 1 sets - 10 reps - Prone Shoulder Horizontal Abduction with External Rotation  - 1 x daily - 7 x weekly - 2-3 sets - 10 reps - Prone Single Arm Shoulder Y  - 1 x daily - 7 x weekly - 2-3 sets - 10 reps - Prone Shoulder Extension - Single Arm  - 1 x daily - 7 x weekly - 2-3 sets - 10 reps - Supine Thoracic Mobilization Towel Roll Vertical with Arm Stretch  - 1 x daily - 7 x weekly - 1 sets - Thoracic Foam Roll Mobilization Backstroke  - 1 x daily - 7 x weekly - 1 sets - 10 reps - Open Book Chest Rotation Stretch on Foam 1/2 Roll  - 1 x daily - 7 x weekly - 1 sets - 10 reps - Snow Angels on Foam Roll  - 1 x daily -  7 x weekly - 1 sets - 10 reps - Quad Setting and Stretching  - 1 x daily - 7 x weekly - 2 sets - 10 reps - 5 sec  hold - Supine Quad Set  - 1 x daily - 7 x weekly - 2 sets - 10 reps - 5 sec hold - Seated Long Arc Quad  - 1 x daily - 7 x weekly - 1 sets - 10 reps - 10 sec hold - Wall Push Up  - 1 x daily - 7 x weekly - 23 sets - 10 reps - Standing Thoracic Open Book  at Wall  - 1 x daily - 7 x weekly - 3 sets - 10 reps - Wall Angels  - 1 x daily - 7 x weekly - 3 sets - 10 reps - Wall Quarter Squat with Foam Roller  - 1 x daily - 7 x weekly - 3 sets - 10 reps - Swiss Ball March  - 1 x daily - 7 x weekly - 3 sets - 10 reps - Swiss Ball March and Kick  - 1 x daily - 7 x weekly - 3 sets - 10 reps - Seated March with Opposite Arm Flexion on Swiss Ball  - 1 x daily - 7 x weekly - 3 sets - 10 reps - Pelvic Circles on [employer]  - 1 x daily - 7 x weekly - 3 sets - 10 reps - [employer] Up  - 1 x daily - 7 x weekly - 3 sets - 10 reps  ASSESSMENT:  CLINICAL IMPRESSION: [name] reports no pain today.  We focused exercises on postural alignment and periscap muscle strength. Pt responded well with cues provided as needed. NDI score has improved but not 8 points as anticipated. Pt has reach her desired functional level, she canceled her next appointment as she feels ready to wrap up PT today, so she was agreeable to a 30 day hold.   OBJECTIVE IMPAIRMENTS: decreased activity tolerance, decreased ROM, hypomobility, increased fascial restrictions, impaired perceived functional ability, increased muscle spasms, and pain.   ACTIVITY LIMITATIONS: carrying, lifting, bending, sleeping, transfers, bed mobility, and reach over head  PARTICIPATION LIMITATIONS: meal prep, cleaning, laundry, driving, shopping, community activity, and yard work  PERSONAL FACTORS: Past/current experiences, Time since onset of injury/illness/exacerbation, and 1-2 comorbidities: Degenerative disk disease, HTN, osteopenia  are  also affecting patient's functional outcome.   REHAB POTENTIAL: Good  CLINICAL DECISION MAKING: Stable/uncomplicated  EVALUATION COMPLEXITY: Low   GOALS: Goals reviewed with patient? Yes  SHORT TERM GOALS: Target date: 08/25/2023   Patient will be independent with initial HEP.  Baseline:  Goal status: MET 08/13/23  LONG TERM GOALS: Target date: 10/06/2023   Patient will be independent with advanced/ongoing HEP to improve outcomes and carryover.  Baseline:  Goal status: MET 09/01/23- updated  09/25/23 - met for current.  2.  Patient will report 75% improvement in neck pain to improve QOL.  Baseline:  Goal status: MET 09/08/23 - 50% improvement  09/28/23- 80% improvement  3.  Patient will demonstrate full pain free cervical ROM for safety with driving.  Baseline: see objective Goal status: MET 09/08/23 - see objective, still limited and some pain  09/25/23- no pain with cervical AROM, just tightness  4.  Patient will report at least 8 points improvement on NDI to demonstrate improved functional ability.  Baseline: 14/50 Goal status: NOT MET 09/29/23 6 / 50 = 12.0 %   PLAN:  PT FREQUENCY: 1-2x/week  PT DURATION: 8 weeks  PLANNED INTERVENTIONS: 97110-Therapeutic exercises, 97530- Therapeutic activity, 97112- Neuromuscular re-education, 97535- Self Care, 95284- Manual therapy, 97014- Electrical stimulation (unattended), 97035- Ultrasound, 13244- Traction (mechanical), Taping, Dry Needling, Joint mobilization, Joint manipulation, Spinal manipulation, Spinal mobilization, Cryotherapy, and Moist heat  PLAN FOR NEXT SESSION: progressing towards d/c  Solon Alban L Chestine Spore, PTA 09/29/2023, 11:05 AM  PHYSICAL THERAPY DISCHARGE SUMMARY  Visits from Start of Care: 11  Current functional level related to goals / functional outcomes: Decreased neck pain, improved functional ability    Remaining deficits: Limited cervical ROM   Education /  Equipment: HEP  Plan: Patient agrees to  discharge.  Refer to above clinical impression and goal assessment for status as of last visit on 09/29/2023. Patient was placed on hold for 30 days and has not needed to return to PT, therefore will proceed with discharge from PT for this episode.    Jena Gauss, PT  11/12/2023 3:42 PM

## 2023-10-08 ENCOUNTER — Other Ambulatory Visit: Payer: Self-pay | Admitting: Family Medicine

## 2023-10-08 MED ORDER — LISINOPRIL-HYDROCHLOROTHIAZIDE 10-12.5 MG PO TABS: 1.0000 | ORAL_TABLET | Freq: Every day | ORAL | 1 refills | Status: DC

## 2023-12-20 ENCOUNTER — Other Ambulatory Visit: Payer: Self-pay | Admitting: Family Medicine

## 2023-12-20 DIAGNOSIS — E785 Hyperlipidemia, unspecified: Secondary | ICD-10-CM

## 2024-01-07 DIAGNOSIS — H524 Presbyopia: Secondary | ICD-10-CM | POA: Diagnosis not present

## 2024-01-07 DIAGNOSIS — H35033 Hypertensive retinopathy, bilateral: Secondary | ICD-10-CM | POA: Diagnosis not present

## 2024-01-07 DIAGNOSIS — H43811 Vitreous degeneration, right eye: Secondary | ICD-10-CM | POA: Diagnosis not present

## 2024-01-07 DIAGNOSIS — H43392 Other vitreous opacities, left eye: Secondary | ICD-10-CM | POA: Diagnosis not present

## 2024-01-07 DIAGNOSIS — H52203 Unspecified astigmatism, bilateral: Secondary | ICD-10-CM | POA: Diagnosis not present

## 2024-01-07 DIAGNOSIS — H401131 Primary open-angle glaucoma, bilateral, mild stage: Secondary | ICD-10-CM | POA: Diagnosis not present

## 2024-01-07 DIAGNOSIS — H5203 Hypermetropia, bilateral: Secondary | ICD-10-CM | POA: Diagnosis not present

## 2024-01-07 DIAGNOSIS — H26493 Other secondary cataract, bilateral: Secondary | ICD-10-CM | POA: Diagnosis not present

## 2024-01-07 DIAGNOSIS — Z961 Presence of intraocular lens: Secondary | ICD-10-CM | POA: Diagnosis not present

## 2024-01-29 ENCOUNTER — Ambulatory Visit: Payer: Medicare Other

## 2024-01-29 VITALS — Ht 61.5 in | Wt 160.0 lb

## 2024-01-29 DIAGNOSIS — Z0000 Encounter for general adult medical examination without abnormal findings: Secondary | ICD-10-CM | POA: Diagnosis not present

## 2024-01-29 NOTE — Progress Notes (Signed)
 Subjective:   [name] is a 73 y.o. who presents for a Medicare Wellness preventive visit.  Visit Complete: Virtual I connected with  [name] on 01/29/24 by a audio enabled telemedicine application and verified that I am speaking with the correct person using two identifiers.  Patient Location: Home  Provider Location: Home Office  I discussed the limitations of evaluation and management by telemedicine. The patient expressed understanding and agreed to proceed.  Vital Signs: Because this visit was a virtual/telehealth visit, some criteria may be missing or patient reported. Any vitals not documented were not able to be obtained and vitals that have been documented are patient reported.    Persons Participating in Visit: Patient.  AWV Questionnaire: Yes: Patient Medicare AWV questionnaire was completed by the patient on 01/24/24; I have confirmed that all information answered by patient is correct and no changes since this date.  Cardiac Risk Factors include: advanced age (>57men, >49 women);hypertension;dyslipidemia     Objective:    Today's Vitals   01/29/24 1555  Weight: 160 lb (72.6 kg)  Height: 5' 1.5" (1.562 m)   Body mass index is 29.74 kg/m.     01/29/2024    4:02 PM 08/11/2023    8:52 AM 01/22/2023    8:59 AM 12/31/2022    5:37 PM 09/09/2017    2:10 PM  Advanced Directives  Does Patient Have a Medical Advance Directive? No No;Yes No No;Yes No  Type of Advance Directive  Healthcare Power of [employer] Power of Attorney   Does patient want to make changes to medical advance directive?    No - Patient declined   Would patient like information on creating a medical advance directive? No - Patient declined No - Patient declined No - Patient declined No - Patient declined Yes (MAU/Ambulatory/Procedural Areas - Information given)    Current Medications (verified) Outpatient Encounter Medications as of 01/29/2024  Medication Sig   aspirin  EC 81 MG tablet Take 1 tablet (81 mg total) by mouth daily.   ezetimibe (ZETIA) 10 MG tablet Take 1 tablet by mouth daily. (Patient not taking: Reported on 07/25/2023)   lisinopril-hydrochlorothiazide (ZESTORETIC) 10-12.5 MG tablet Take 1 tablet by mouth daily.   naproxen (NAPROSYN) 500 MG tablet Take 1 tablet (500 mg total) by mouth 2 (two) times daily with a meal.   rosuvastatin (CRESTOR) 5 MG tablet TAKE 1 TABLET (5 MG TOTAL) BY MOUTH DAILY.   No facility-administered encounter medications on file as of 01/29/2024.    Allergies (verified) Celebrex [celecoxib], Elemental sulfur, Flagyl [metronidazole], and Reglan [metoclopramide]   History: Past Medical History:  Diagnosis Date   Cancer (HCC)    Basal cell carcinoma of the nose.   Gastroparesis    Hyperlipidemia    Hypertension    Osteopenia    Past Surgical History:  Procedure Laterality Date   CATARACT EXTRACTION Bilateral    Family History  Problem Relation Age of Onset   Hypertension Mother    COPD Father    Social History   Socioeconomic History   Marital status: Divorced    Spouse name: Not on file   Number of children: Not on file   Years of education: Not on file   Highest education level: 12th grade  Occupational History   Not on file  Tobacco Use   Smoking status: Never   Smokeless tobacco: Never  Substance and Sexual Activity   Alcohol use: Not on file   Drug use: Not  on file   Sexual activity: Not on file  Other Topics Concern   Not on file  Social History Narrative   Not on file   Social Drivers of Health   Financial Resource Strain: Low Risk  (01/29/2024)   Overall Financial Resource Strain (CARDIA)    Difficulty of Paying Living Expenses: Not hard at all  Food Insecurity: No Food Insecurity (01/29/2024)   Hunger Vital Sign    Worried About Running Out of Food in the Last Year: Never true    Ran Out of Food in the Last Year: Never true  Transportation Needs: No Transportation Needs (01/29/2024)    PRAPARE - Administrator, Civil Service (Medical): No    Lack of Transportation (Non-Medical): No  Physical Activity: Insufficiently Active (01/29/2024)   Exercise Vital Sign    Days of Exercise per Week: 3 days    Minutes of Exercise per Session: 40 min  Stress: No Stress Concern Present (01/29/2024)   [employer] of Occupational Health - Occupational Stress Questionnaire    Feeling of Stress : Not at all  Social Connections: Moderately Integrated (01/29/2024)   Social Connection and Isolation Panel [NHANES]    Frequency of Communication with Friends and Family: More than three times a week    Frequency of Social Gatherings with Friends and Family: Once a week    Attends Religious Services: More than 4 times per year    Active Member of [employer] or Organizations: Yes    Attends Banker Meetings: Never    Marital Status: Divorced    Tobacco Counseling Counseling given: Not Answered    Clinical Intake:  Pre-visit preparation completed: Yes  Pain : No/denies pain     BMI - recorded: 29.74 Nutritional Status: BMI 25 -29 Overweight Nutritional Risks: None Diabetes: No  No results found for: "HGBA1C"   How often do you need to have someone help you when you read instructions, pamphlets, or other written materials from your doctor or pharmacy?: 1 - Never  Interpreter Needed?: No  Information entered by :: Theresa Mulligan LPN   Activities of Daily Living      01/29/2024    4:00 PM 01/24/2024    4:13 PM  In your present state of health, do you have any difficulty performing the following activities:  Hearing? 0 0  Vision? 0 0  Difficulty concentrating or making decisions? 0 0  Walking or climbing stairs? 0 0  Dressing or bathing? 0 0  Doing errands, shopping? 0 0  Preparing Food and eating ? N N  Using the Toilet? N N  In the past six months, have you accidently leaked urine? N N  Do you have problems with loss of bowel control? N N   Managing your Medications? N N  Managing your Finances? N N  Housekeeping or managing your Housekeeping? N N    Patient Care Team: Clayborne Dana, NP as PCP - General (Family Medicine) Carney Harder, MD as Referring Physician (Geriatric Medicine)  Indicate any recent Medical Services you may have received from other than [facility] providers in the past year (date may be approximate).     Assessment:   This is a routine wellness examination for [employer].  Hearing/Vision screen Hearing Screening - Comments:: Denies hearing difficulties   Vision Screening - Comments:: Wears rx glasses - up to date with routine eye exams with  [facility]   Goals Addressed  This Visit's Progress     Increase physical activity (pt-stated)        Remain active.       Depression Screen      01/29/2024    4:05 PM 01/22/2023    9:00 AM 12/19/2022    8:57 AM 11/20/2022    1:40 PM  PHQ 2/9 Scores  PHQ - 2 Score 0 0 0 0  PHQ- 9 Score   0 1    Fall Risk      01/29/2024    4:00 PM 01/24/2024    4:13 PM 01/21/2023   11:33 AM 12/19/2022    8:57 AM  Fall Risk   Falls in the past year? 1 1 0 0  Number falls in past yr: 0 0 0 0  Injury with Fall? 0 0 0 0  Risk for fall due to : No Fall Risks  No Fall Risks No Fall Risks  Follow up Falls prevention discussed;Falls evaluation completed  Falls evaluation completed Falls evaluation completed    MEDICARE RISK AT HOME:   Medicare Risk at Home Any stairs in or around the home?: No If so, are there any without handrails?: No Home free of loose throw rugs in walkways, pet beds, electrical cords, etc?: Yes Adequate lighting in your home to reduce risk of falls?: Yes Life alert?: No Use of a cane, walker or w/c?: No Grab bars in the bathroom?: No Shower chair or bench in shower?: No Elevated toilet seat or a handicapped toilet?: No  TIMED UP AND GO:  Was the test performed?  No  Cognitive Function: 6CIT completed        01/29/2024     4:02 PM 01/22/2023    9:04 AM  6CIT Screen  What Year? 0 points 0 points  What month? 0 points 0 points  What time? 0 points 0 points  Count back from 20 0 points 0 points  Months in reverse 0 points 0 points  Repeat phrase 0 points 0 points  Total Score 0 points 0 points    Immunizations Immunization History  Administered Date(s) Administered   Fluad Trivalent(High Dose 65+) 08/27/2023   Influenza, High Dose Seasonal PF 08/01/2020   PFIZER Comirnaty(Gray Top)Covid-19 Tri-Sucrose Vaccine 05/14/2021   PFIZER(Purple Top)SARS-COV-2 Vaccination 11/09/2019, 11/30/2019   Pfizer Covid-19 Vaccine Bivalent Booster 40yrs & up 07/12/2021   Pfizer(Comirnaty)Fall Seasonal Vaccine 12 years and older 08/07/2022, 08/27/2023   Pneumococcal Conjugate-13 07/31/2015   Pneumococcal Polysaccharide-23 07/31/2016   Td 05/30/2006   Tdap 07/31/2016   Zoster Recombinant(Shingrix) 08/17/2018, 12/15/2018   Zoster, Live 07/15/2011    Screening Tests Health Maintenance  Topic Date Due   COVID-19 Vaccine (7 - 2024-25 season) 02/24/2024   INFLUENZA VACCINE  05/21/2024   Medicare Annual Wellness (AWV)  01/28/2025   MAMMOGRAM  08/17/2025   Colonoscopy  08/19/2025   DTaP/Tdap/Td (3 - Td or Tdap) 07/31/2026   Pneumonia Vaccine 50+ Years old  Completed   DEXA SCAN  Completed   Hepatitis C Screening  Completed   Zoster Vaccines- Shingrix  Completed   HPV VACCINES  Aged Out   Meningococcal B Vaccine  Aged Out    Health Maintenance  There are no preventive care reminders to display for this patient.  Health Maintenance Items Addressed:    Additional Screening:  Vision Screening: Recommended annual ophthalmology exams for early detection of glaucoma and other disorders of the eye.  Dental Screening: Recommended annual dental exams for proper oral hygiene  [employer]  Referral / Chronic Care Management: CRR required this visit?  No   CCM required this visit?  No     Plan:     I  have personally reviewed and noted the following in the patient's chart:   Medical and social history Use of alcohol, tobacco or illicit drugs  Current medications and supplements including opioid prescriptions. Patient is not currently taking opioid prescriptions. Functional ability and status Nutritional status Physical activity Advanced directives List of other physicians Hospitalizations, surgeries, and ER visits in previous 12 months Vitals Screenings to include cognitive, depression, and falls Referrals and appointments  In addition, I have reviewed and discussed with patient certain preventive protocols, quality metrics, and best practice recommendations. A written personalized care plan for preventive services as well as general preventive health recommendations were provided to patient.     Tillie Rung, LPN   1/61/0960   After Visit Summary: (MyChart) Due to this being a telephonic visit, the after visit summary with patients personalized plan was offered to patient via MyChart   Notes: Nothing significant to report at this time.

## 2024-01-29 NOTE — Patient Instructions (Addendum)
 [name] , Thank you for taking time to come for your Medicare Wellness Visit. I appreciate your ongoing commitment to your health goals. Please review the following plan we discussed and let me know if I can assist you in the future.   Referrals/Orders/Follow-Ups/Clinician Recommendations:   This is a list of the screening recommended for you and due dates:  Health Maintenance  Topic Date Due   COVID-19 Vaccine (7 - 2024-25 season) 02/24/2024   Flu Shot  05/21/2024   Medicare Annual Wellness Visit  01/28/2025   Mammogram  08/17/2025   Colon Cancer Screening  08/19/2025   DTaP/Tdap/Td vaccine (3 - Td or Tdap) 07/31/2026   Pneumonia Vaccine  Completed   DEXA scan (bone density measurement)  Completed   Hepatitis C Screening  Completed   Zoster (Shingles) Vaccine  Completed   HPV Vaccine  Aged Out   Meningitis B Vaccine  Aged Out    Advanced directives: (Provided) Advance directive discussed with you today. I have provided a copy for you to complete at home and have notarized. Once this is complete, please bring a copy in to our office so we can scan it into your chart.   Next Medicare Annual Wellness Visit scheduled for next year: Yes

## 2024-03-25 ENCOUNTER — Other Ambulatory Visit: Payer: Self-pay | Admitting: Family Medicine

## 2024-03-25 DIAGNOSIS — E785 Hyperlipidemia, unspecified: Secondary | ICD-10-CM

## 2024-05-21 ENCOUNTER — Ambulatory Visit: Payer: Self-pay

## 2024-05-21 NOTE — Telephone Encounter (Signed)
 FYI Only or Action Required?: FYI only for provider.  Patient was last seen in primary care on 07/25/2023 by Almarie Waddell NOVAK, NP.  Called Nurse Triage reporting Back Pain.  Symptoms began over a year ago after falling on her backside from a ladder pressure washing her home.  Interventions attempted: Rest, hydration, or home remedies.  Symptoms are: gradually worsening.  Triage Disposition: See PCP Within 2 Weeks  Patient/caregiver understands and will follow disposition?: Yes                 Copied from CRM [id number]. Topic: Clinical - Red Word Triage >> May 21, 2024  9:38 AM Carlyon D wrote: Red Word that prompted transfer to Nurse Triage: Pt fell a while back now has Severe lower back pain even down to the pelvic area. Back pain has gotten worse. Reason for Disposition  Back pain is a chronic symptom (recurrent or ongoing AND present > 4 weeks)  Answer Assessment - Initial Assessment Questions Year ago--pressure washing house Patient fell on her backside off a ladder Standing for a long time hurts or carrying granddaughter Patient states she urinates more but she states this has been going since she has been keeping her granddaughter for over a year now before the injury  Patient is advised that if anything gets worse to go to the Emergency Room Patient verbalized understanding.       1. ONSET: When did the pain begin? (e.g., minutes, hours, days)     A year ago 2. LOCATION: Where does it hurt? (upper, mid or lower back)     Lower 3. SEVERITY: How bad is the pain?  (e.g., Scale 1-10; mild, moderate, or severe)     10 4. PATTERN: Is the pain constant? (e.g., yes, no; constant, intermittent)      Off and on worse at times 5. RADIATION: Does the pain shoot into your legs or somewhere else?     No 6. CAUSE:  What do you think is causing the back pain?      Possibly injury a year ago falling off ladder on to backside  7. BACK OVERUSE:  Any recent  lifting of heavy objects, strenuous work or exercise?     Daily use 8. MEDICINES: What have you taken so far for the pain? (e.g., nothing, acetaminophen, NSAIDS)     Nothing 9. NEUROLOGIC SYMPTOMS: Do you have any weakness, numbness, or problems with bowel/bladder control?     Frequent urination---worse since keeping grandchild 57. OTHER SYMPTOMS: Do you have any other symptoms? (e.g., fever, abdomen pain, burning with urination, blood in urine)       Frequent urination---worse since keeping grandchild  Protocols used: Back Pain-A-AH

## 2024-05-25 ENCOUNTER — Ambulatory Visit (HOSPITAL_BASED_OUTPATIENT_CLINIC_OR_DEPARTMENT_OTHER)
Admission: RE | Admit: 2024-05-25 | Discharge: 2024-05-25 | Disposition: A | Discharge status: Home / Self Care | Source: Ambulatory Visit | Attending: Family | Admitting: Family

## 2024-05-25 ENCOUNTER — Ambulatory Visit (INDEPENDENT_AMBULATORY_CARE_PROVIDER_SITE_OTHER): Admitting: Family

## 2024-05-25 ENCOUNTER — Encounter: Payer: Self-pay | Admitting: Family

## 2024-05-25 VITALS — BP 140/72 | HR 81 | Ht 61.5 in | Wt 164.0 lb

## 2024-05-25 DIAGNOSIS — M545 Low back pain, unspecified: Secondary | ICD-10-CM | POA: Diagnosis not present

## 2024-05-25 DIAGNOSIS — M5127 Other intervertebral disc displacement, lumbosacral region: Secondary | ICD-10-CM | POA: Diagnosis not present

## 2024-05-25 DIAGNOSIS — G8929 Other chronic pain: Secondary | ICD-10-CM | POA: Insufficient documentation

## 2024-05-25 DIAGNOSIS — M47816 Spondylosis without myelopathy or radiculopathy, lumbar region: Secondary | ICD-10-CM | POA: Diagnosis not present

## 2024-05-25 MED ORDER — TIZANIDINE HCL 2 MG PO TABS: 2.0000 mg | ORAL_TABLET | Freq: Every day | ORAL | 0 refills | Status: AC

## 2024-05-25 NOTE — Progress Notes (Signed)
 [name] is a 73 y.o. female with the following history as recorded in EpicCare:  Patient Active Problem List   Diagnosis Date Noted   DDD (degenerative disc disease), cervical 07/25/2023   Popliteus tendinitis of right lower extremity 11/25/2022   Dyslipidemia, goal to be determined 02/10/2017   Essential hypertension 02/10/2017    Current Outpatient Medications  Medication Sig Dispense Refill   aspirin  EC 81 MG tablet Take 1 tablet (81 mg total) by mouth daily. 90 tablet 3   lisinopril -hydrochlorothiazide  (ZESTORETIC ) 10-12.5 MG tablet Take 1 tablet by mouth daily. 90 tablet 1   naproxen  (NAPROSYN ) 500 MG tablet Take 1 tablet (500 mg total) by mouth 2 (two) times daily with a meal. 60 tablet 1   rosuvastatin  (CRESTOR ) 5 MG tablet Take 1 tablet (5 mg total) by mouth daily. 30 tablet 0   tiZANidine  (ZANAFLEX ) 2 MG tablet Take 1 tablet (2 mg total) by mouth at bedtime. 30 tablet 0   No current facility-administered medications for this visit.    Allergies: Celebrex [celecoxib], Elemental sulfur, Flagyl [metronidazole], and Reglan [metoclopramide]  Past Medical History:  Diagnosis Date   Cancer (HCC)    Basal cell carcinoma of the nose.   Gastroparesis    Hyperlipidemia    Hypertension    Osteopenia     Past Surgical History:  Procedure Laterality Date   CATARACT EXTRACTION Bilateral     Family History  Problem Relation Age of Onset   Hypertension Mother    COPD Father     Social History   Tobacco Use   Smoking status: Never   Smokeless tobacco: Never  Substance Use Topics   Alcohol use: Not on file    Subjective:   Low back pain on and off for the past year; has been working in PT for known neck arthritis;  No known recent trauma but symptoms are worse with repeated bending/ lifting; currently keeping with granddaughter who weighs about 20 pounds and having to lots of bending; no numbness or tingling or radiating symptoms;   Objective:  Vitals:    05/25/24 1443 05/25/24 1504  BP: (!) 162/84 (!) 140/72  Pulse: 81   SpO2: 97%   Weight: 164 lb (74.4 kg)   Height: 5' 1.5 (1.562 m)     General: Well developed, well nourished, in no acute distress  Skin : Warm and dry.  Head: Normocephalic and atraumatic  Lungs: Respirations unlabored;  Musculoskeletal: No deformities; no active joint inflammation  Extremities: No edema, cyanosis, clubbing  Vessels: Symmetric bilaterally  Neurologic: Alert and oriented; speech intact; face symmetrical; moves all extremities well; CNII-XII intact without focal deficit   Assessment:  1. Chronic low back pain without sciatica, unspecified back pain laterality     Plan:  Will update lumbar X-ray; Rx for Tizanidine  to use at night as needed; patient defers other medication; will most likely refer back to PT for further evaluation;  She is also encouraged to see her PCP to follow up on elevated blood pressure.   No follow-ups on file.  Orders Placed This Encounter  Procedures   DG Lumbar Spine Complete    Standing Status:   Future    Number of Occurrences:   1    Expiration Date:   05/25/2025    Reason for Exam (SYMPTOM  OR DIAGNOSIS REQUIRED):   xray lumbar    Preferred imaging location?:   [facility]    Requested Prescriptions   Signed Prescriptions Disp Refills  tiZANidine  (ZANAFLEX ) 2 MG tablet 30 tablet 0    Sig: Take 1 tablet (2 mg total) by mouth at bedtime.

## 2024-05-25 NOTE — Patient Instructions (Signed)
 Please schedule a follow up with your PCP to follow up on your blood pressure; it was elevated today;

## 2024-06-08 ENCOUNTER — Ambulatory Visit: Payer: Self-pay | Admitting: Family

## 2024-06-09 ENCOUNTER — Other Ambulatory Visit: Payer: Self-pay | Admitting: Family

## 2024-06-09 DIAGNOSIS — M545 Low back pain, unspecified: Secondary | ICD-10-CM

## 2024-06-18 ENCOUNTER — Other Ambulatory Visit: Payer: Self-pay | Admitting: Family

## 2024-06-29 ENCOUNTER — Ambulatory Visit: Admitting: Physical Therapy

## 2024-07-11 NOTE — Therapy (Incomplete)
 OUTPATIENT PHYSICAL THERAPY THORACOLUMBAR EVALUATION   Patient Name: [name] MRN: [id number] DOB:Jun 20, 1951, 73 y.o., female Today's Date: 07/14/2024  END OF SESSION:  PT End of Session - 07/13/24 1102     Visit Number 1    Date for Recertification  09/07/24    PT Start Time 1100    PT Stop Time 1140    PT Time Calculation (min) 40 min          Past Medical History:  Diagnosis Date   Cancer (HCC)    Basal cell carcinoma of the nose.   Gastroparesis    Hyperlipidemia    Hypertension    Osteopenia    Past Surgical History:  Procedure Laterality Date   CATARACT EXTRACTION Bilateral    Patient Active Problem List   Diagnosis Date Noted   DDD (degenerative disc disease), cervical 07/25/2023   Popliteus tendinitis of right lower extremity 11/25/2022   Dyslipidemia, goal to be determined 02/10/2017   Essential hypertension 02/10/2017    PCP: Almarie Waddell NOVAK, NP   REFERRING PROVIDER: Jason Leita Repine,*   REFERRING DIAG: [phone] (ICD-10-CM) - Chronic low back pain, unspecified back pain laterality, unspecified whether sciatica present   THERAPY DIAG:  Other low back pain  Muscle weakness (generalized)  Abnormal posture  Difficulty in walking, not elsewhere classified  RATIONALE FOR EVALUATION AND TREATMENT: Rehabilitation  ONSET DATE: 1 yr  NEXT MD VISIT:    SUBJECTIVE:                                                                                                                                                                                                         SUBJECTIVE STATEMENT: 73 y/o referred to PT for LBP without sciatica.   Patient reports has had LBP about a year since she fell off of a ladder.  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  These machines do not bother her.   However, she has also been keeping her granddaughter for about a year now and is having to do a lot of  picking her up/down.  This bothers her back the most.   She reports the pain is central low back without any radiation or radiculopathy.  States always some dull level of pain, but that it can get sharp at times.   Bending and lifting are the most painful things for her.     PAIN: Are you having pain? Yes: NPRS scale: 4/10 now; 1/10  at night; 6/10 worst over last week  Pain location: central Low back Pain description: aching Aggravating factors: bending and lifting  Relieving factors: lying on back  PERTINENT HISTORY:  HTN, osteopenia, Cervical DDD, popliteus tendonitis  PRECAUTIONS: None  RED FLAGS: None  WEIGHT BEARING RESTRICTIONS: No  FALLS:  Has patient fallen in last 6 months? No  LIVING ENVIRONMENT: Lives with: lives alone Lives in: House/apartment Stairs: No Has following equipment at home: None  OCCUPATION: retired from Engineer, manufacturing systems for various healthcare facilities  PLOF: Independent with gait  PATIENT GOALS: relief the pain and get more mobility (in the back)   OBJECTIVE: (objective measures completed at initial evaluation unless otherwise dated)  DIAGNOSTIC FINDINGS:  CLINICAL DATA:  Low back pain 1 year since falling off a ladder. Pain is worse with activity.   EXAM: LUMBAR SPINE - COMPLETE 4+ VIEW   COMPARISON:  CT abdomen pelvis 01/28/2014   FINDINGS: No acute fracture or evidence of traumatic listhesis. Moderate disc space height loss at L5-S1. Moderate facet arthropathy at L4-L5 and L5-S1.   IMPRESSION: Moderate degenerative changes at L4-L5 and L5-S1.     Electronically Signed   By: Norman Gatlin M.D.   On: 06/08/2024 02:45CLINICAL DATA:  Chronic neck pain since 2017, with worsening for 3 months. Weakness in the right arm.   EXAM: MRI CERVICAL SPINE WITHOUT CONTRAST   TECHNIQUE: Multiplanar, multisequence MR imaging of the cervical spine was performed. No intravenous contrast was administered.   COMPARISON:  11/23/2015    FINDINGS: Alignment: Physiologic.   Vertebrae: No fracture, evidence of discitis, or bone lesion.   Cord: Normal signal and morphology.   Posterior Fossa, vertebral arteries, paraspinal tissues: Negative.   Disc levels:   C2-3: Bulky left degenerative facet spurring. Small central protrusion. No impingement   C3-4: Fairly bulky degenerative facet spurring. Mild disc height loss and bulging with small central protrusion. The canal and foramina are patent   C4-5: Degenerative facet spurring with ankylosis. Mild disc height loss with tiny central protrusion. No neural impingement.   C5-6: Intervertebral and facet ankylosis with underlying ridging. No neural impingement.   C6-7: Degenerative facet spurring which is at least moderate on both sides. Mild disc bulging. Patent canal and foramina   C7-T1:Degenerative facet spurring on both sides, mild to moderate. Mild disc bulging. No neural impingement.   IMPRESSION: Generalized cervical spine degeneration especially affecting facets with facet ankylosis at C4-5 and C5-6. The spinal canal and foramina are diffusely patent. No marrow edema.     Electronically Signed   By: Dorn Roulette M.D.   On: 09/04/2023 07:14    PATIENT SURVEYS:  Modified Oswestry:  MODIFIED OSWESTRY DISABILITY SCALE  Date: 07/13/24 Score  Pain intensity 3 =  Pain medication provides me with moderate relief from pain.  2. Personal care (washing, dressing, etc.) 0 =  I can take care of myself normally without causing increased pain.  3. Lifting 3 = Pain prevents me from lifting heavy weights, but I can manage (5) I have hardly any social life because of my pain. light to medium weights if they are conveniently positioned  4. Walking 3 =  Pain prevents me from walking more than  mile.  5. Sitting 1 =  I can only sit in my favorite chair as long as I like.  6. Standing 0 =  I can stand as long as I want without increased pain.  7. Sleeping 0 = Pain  does not prevent me from sleeping well.  8. Social Life 0 =  My social life is normal and does not increase my pain.  9. Traveling 1 =  I can travel anywhere, but it increases my pain.  10. Employment/ Homemaking 2 = I can perform most of my homemaking/job duties, but pain prevents me from performing more physically stressful activities (eg, lifting, vacuuming).  Total 13/50 = 26%   Interpretation of scores: Score Category Description  0-20% Minimal Disability The patient can cope with most living activities. Usually no treatment is indicated apart from advice on lifting, sitting and exercise  21-40% Moderate Disability The patient experiences more pain and difficulty with sitting, lifting and standing. Travel and social life are more difficult and they may be disabled from work. Personal care, sexual activity and sleeping are not grossly affected, and the patient can usually be managed by conservative means  41-60% Severe Disability Pain remains the main problem in this group, but activities of daily living are affected. These patients require a detailed investigation  61-80% Crippled Back pain impinges on all aspects of the patient's life. Positive intervention is required  81-100% Bed-bound  These patients are either bed-bound or exaggerating their symptoms  Bluford FORBES Zoe DELENA Karon DELENA, et al. Surgery versus conservative management of stable thoracolumbar fracture: the PRESTO feasibility RCT. [place] ([place]): [employer]; 2021 Nov. [facility] Technology Assessment, No. 25.62.) Appendix 3, Oswestry Disability Index category descriptors. Available from: FindJewel[employer].cz  Minimally Clinically Important Difference (MCID) = 12.8% odi =   SCREENING FOR RED FLAGS: Bowel or bladder incontinence: No Spinal tumors: No Cauda equina syndrome: No Compression fracture: No Abdominal aneurysm: No  COGNITION:  Overall cognitive status: Within functional limits for  tasks assessed    SENSATION: WFL  POSTURE:  increased lumbar lordosis and anterior pelvic tilt  PALPATION: TTP over B paraspinals, B quadratus, some over the SI joints BLE  LUMBAR ROM:   Active  Eval  Flexion To distal shins; p!  Extension 75%; more p!  Right lateral flexion To knee  Left lateral flexion To knee  Right rotation 75%  Left rotation 75%  (Blank rows = not tested)  MUSCLE LENGTH: Hamstrings: Right SLR = 80 deg; Left SLR = 73 deg with some end range back pain Thomas test: Right NT deg; Left gets it just to the table deg Hamstrings: mild tight BLE L>R Piriformis: minimal tightness BLE Hip flexors: mild to mod tightness   LOWER EXTREMITY ROM:     Active  Right eval Left eval  Hip flexion    Hip extension    Hip abduction    Hip adduction    Hip internal rotation    Hip external rotation    Knee flexion    Knee extension    Ankle dorsiflexion    Ankle plantarflexion    Ankle inversion    Ankle eversion    (Blank rows = not tested)  LOWER EXTREMITY MMT:    MMT Right eval Left eval  Hip flexion 4 4  Hip extension 4- 4-  Hip abduction 4- 4-  Hip adduction    Hip internal rotation 4+ 4+  Hip external rotation 5 5  Knee flexion 5 5  Knee extension 5 5  Ankle dorsiflexion 5 5  Ankle plantarflexion 5 5  Ankle inversion    Ankle eversion     (Blank rows = not tested)  LUMBAR SPECIAL TESTS:  Straight leg raise test: Positive, Slump test: Negative, SI Compression/distraction test: Positive, FABER test: Positive, Gaenslen's test: Positive, Long sit test: Negative, and  Thomas test: Negative Pelvic landmarks and malleoli appear equal in supine.  In standing L shoulder seems a little lower  FUNCTIONAL TESTS:    GAIT: Distance walked: no device Assistive device utilized: None Level of assistance: Complete Independence Gait pattern: has compensated Trendelenburg BLE R >L, short quick steps with increased pelvic rotation bilaterally, accentuated  lordosis Comments:    TODAY'S TREATMENT:  SELF CARE: Provided education on PT POC progression.    PATIENT EDUCATION:  Education details: PT eval findings, anticipated POC, and initial HEP  Person educated: Patient Education method: Explanation, Demonstration, Verbal cues, Tactile cues, and Handouts Education comprehension: verbalized understanding, verbal cues required, tactile cues required, and needs further education  HOME EXERCISE PROGRAM: Access Code: [email or web address] URL: [email or web address] Date: 07/13/2024 Prepared by: Garnette Montclair  Exercises - Supine Lower Trunk Rotation  - 1 x daily - 7 x weekly - 3 sets - 10 reps - Supine Posterior Pelvic Tilt  - 1 x daily - 7 x weekly - 3 sets - 10 reps - Supine Bridge  - 1 x daily - 7 x weekly - 3 sets - 10 reps - Sidelying Hip Abduction  - 1 x daily - 7 x weekly - 2 sets - 10 reps - Standing Hip Extension with Counter Support  - 1 x daily - 7 x weekly - 3 sets - 10 reps   ASSESSMENT:  CLINICAL IMPRESSION: [name] is a 73 y.o. female who was referred to physical therapy for evaluation and treatment for LBP.   Patient reports onset of LB pain beginning about a year ago after falling from ladder.   However, she has also been picking up her granddaughter a lot since she is keeping her during the daytime hours and this is very painful for her.   Pain is worse with flexion.   Patient has hyperlordotic spine with weak abdominals and hip musculature.    Patient has deficits in lumbar ROM, mild B LE flexibility, BLE strength, abnormal posture, and TTP with abnormal muscle tension  which are interfering with ADLs and are impacting quality of life.  On Modified Oswestry patient scored 13/50 demonstrating 26% moderate disability.  [name] will benefit from skilled PT to address above deficits to improve mobility and activity tolerance with decreased pain interference.     OBJECTIVE IMPAIRMENTS: Abnormal gait, difficulty  walking, decreased ROM, decreased strength, postural dysfunction, and pain.   ACTIVITY LIMITATIONS: carrying, lifting, bending, and caring for others  PARTICIPATION LIMITATIONS: cleaning, laundry, and community activity  PERSONAL FACTORS: Age, Time since onset of injury/illness/exacerbation, and 1-2 comorbidities:   HTN, osteopenia, Cervical DDD, popliteus tendonitisare also affecting patient's functional outcome.   REHAB POTENTIAL: Good  CLINICAL DECISION MAKING: Evolving/moderate complexity  EVALUATION COMPLEXITY: Moderate   GOALS: Goals reviewed with patient? Yes  SHORT TERM GOALS: Target date: 08/10/2024   Patient will be independent with initial HEP to improve outcomes and carryover.  Baseline: 100% PT assist required for correct completion Goal status: INITIAL  2.  Patient will report 25% improvement in low back pain to improve QOL. Baseline: 6/10 worst Goal status: INITIAL   LONG TERM GOALS: Target date: 09/08/2024   Patient will be independent with ongoing/advanced HEP for self-management at home.  Baseline: no advanced HEP yet Goal status: INITIAL  2.  Patient will report 50-75% improvement in low back pain to improve QOL.  Baseline: 6/10 worst Goal status: INITIAL  3.  Patient to demonstrate ability to achieve and maintain good spinal alignment/posturing and body  mechanics needed for daily activities. Baseline: hyperlordotic posture Goal status: INITIAL  4.  Patient will demonstrate full pain free lumbar ROM to perform ADLs.   Baseline: Refer to above lumbar ROM table Goal status: INITIAL  5.  Patient will demonstrate improved BLE strength to >/= 5/5 for improved stability and ease of mobility. Baseline: Refer to above LE MMT table Goal status: INITIAL  6. Patient will report </= 10% on Modified Oswestry (MCID = 12%) to demonstrate improved functional ability with decreased pain interference. Baseline: 26% Goal status: INITIAL  7.  Patient will  tolerate 60 min of (standing/sitting/walking) w/o increased pain to allow for  improved mobility and activity tolerance. Baseline: can do it but with increased pain Goal status: INITIAL  PLAN:  PT FREQUENCY: 1-2x/week  PT DURATION: 8 weeks  PLANNED INTERVENTIONS: 97164- PT Re-evaluation, 97750- Physical Performance Testing, 97110-Therapeutic exercises, 97530- Therapeutic activity, 97112- Neuromuscular re-education, 97535- Self Care, [id number]- Manual therapy, [phone]- Gait training, [phone]- Aquatic Therapy, [phone]- Electrical stimulation (unattended), 97016- Vasopneumatic device, [id number]- Ultrasound, [id number]- Traction (mechanical), [id number]- Ionotophoresis 4mg /ml Dexamethasone, Patient/Family education, Balance training, Stair training, Taping, Joint mobilization, and Spinal mobilization  PLAN FOR NEXT SESSION: progress with core strength and stability (has pain w/ curl ups on eval but tolerated PPT), supine and quadruped stabilization ex;  modalities PRN (declined estim on eval but may want to try)   Anamarie Hunn, PT 07/14/2024, 3:08 PM  Date of referral: see referral Referring provider: Jason Leita Repine, FNP Referring diagnosis? Chronic LBP; M54.50 Treatment diagnosis? (if different than referring diagnosis) low back pain, muscle weakness, abnormal posture  What was this (referring dx) caused by? Ongoing Issue  Lysle of Condition: Chronic (continuous duration > 3 months)   Laterality: Both  Current Functional Measure Score: Back Index modified oswestry = 26%  Objective measurements identify impairments when they are compared to normal values, the uninvolved extremity, and prior level of function.  [x]  Yes  []  No  Objective assessment of functional ability: Moderate functional limitations   Briefly describe symptoms: 73 y/o referred to PT for LBP without sciatica.   Patient reports has had LBP about a year since she fell off of a ladder.  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  These machines do not bother her.   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  This bothers her back the most.   She reports the pain is central low back without any radiation or radiculopathy.  States always some dull level of pain, but that it can get sharp at times.   Bending and lifting are the most painful things for her.      How did symptoms start: after falling from ladder 2 yrs ago  Average pain intensity:  Last 24 hours: 6/10 worst  Past week: same  How often does the pt experience symptoms? Constantly  How much have the symptoms interfered with usual daily activities? Quite a bit  How has condition changed since care began at this facility? No change  In general, how is the patients overall health? Good   BACK PAIN (STarT Back Screening Tool) Has pain spread down the leg(s) at some time in the last 2 weeks? no Has there been pain in the shoulder or neck at some time in the last 2 weeks? no Has the pt only walked short distances because of  back pain? yes Has patient dressed more slowly because of back pain in the past 2 weeks? yes Does patient think it's not safe for a person with this condition to be physically active? no Does patient have worrying thoughts a lot of the time? no Does patient feel back pain is terrible and will never get any better? no Has patient stopped enjoying things they usually enjoy? no

## 2024-07-13 ENCOUNTER — Ambulatory Visit: Attending: Family | Admitting: Rehabilitation

## 2024-07-13 DIAGNOSIS — G8929 Other chronic pain: Secondary | ICD-10-CM | POA: Diagnosis not present

## 2024-07-13 DIAGNOSIS — R262 Difficulty in walking, not elsewhere classified: Secondary | ICD-10-CM | POA: Insufficient documentation

## 2024-07-13 DIAGNOSIS — M545 Low back pain, unspecified: Secondary | ICD-10-CM | POA: Diagnosis not present

## 2024-07-13 DIAGNOSIS — R293 Abnormal posture: Secondary | ICD-10-CM | POA: Insufficient documentation

## 2024-07-13 DIAGNOSIS — M6281 Muscle weakness (generalized): Secondary | ICD-10-CM | POA: Insufficient documentation

## 2024-07-13 DIAGNOSIS — M5459 Other low back pain: Secondary | ICD-10-CM | POA: Insufficient documentation

## 2024-07-14 ENCOUNTER — Other Ambulatory Visit: Payer: Self-pay

## 2024-07-14 ENCOUNTER — Encounter: Payer: Self-pay | Admitting: Rehabilitation

## 2024-07-19 ENCOUNTER — Other Ambulatory Visit (HOSPITAL_BASED_OUTPATIENT_CLINIC_OR_DEPARTMENT_OTHER): Payer: Self-pay | Admitting: Family Medicine

## 2024-07-19 ENCOUNTER — Ambulatory Visit

## 2024-07-19 DIAGNOSIS — M5459 Other low back pain: Secondary | ICD-10-CM

## 2024-07-19 DIAGNOSIS — Z1231 Encounter for screening mammogram for malignant neoplasm of breast: Secondary | ICD-10-CM

## 2024-07-19 DIAGNOSIS — R293 Abnormal posture: Secondary | ICD-10-CM | POA: Diagnosis not present

## 2024-07-19 DIAGNOSIS — G8929 Other chronic pain: Secondary | ICD-10-CM | POA: Diagnosis not present

## 2024-07-19 DIAGNOSIS — M545 Low back pain, unspecified: Secondary | ICD-10-CM | POA: Diagnosis not present

## 2024-07-19 DIAGNOSIS — M6281 Muscle weakness (generalized): Secondary | ICD-10-CM

## 2024-07-19 DIAGNOSIS — R262 Difficulty in walking, not elsewhere classified: Secondary | ICD-10-CM | POA: Diagnosis not present

## 2024-07-19 NOTE — Therapy (Signed)
 OUTPATIENT PHYSICAL THERAPY THORACOLUMBAR TREATMENT   Patient Name: [name] MRN: [id number] DOB:[birth year], 73 y.o., female Today's Date: 07/19/2024  END OF SESSION:  PT End of Session - 07/19/24 1443     Visit Number 2    Date for Recertification  09/07/24    PT Start Time 1403    PT Stop Time 1445    PT Time Calculation (min) 42 min           Past Medical History:  Diagnosis Date   Cancer (HCC)    Basal cell carcinoma of the nose.   Gastroparesis    Hyperlipidemia    Hypertension    Osteopenia    Past Surgical History:  Procedure Laterality Date   CATARACT EXTRACTION Bilateral    Patient Active Problem List   Diagnosis Date Noted   DDD (degenerative disc disease), cervical 07/25/2023   Popliteus tendinitis of right lower extremity 11/25/2022   Dyslipidemia, goal to be determined 02/10/2017   Essential hypertension 02/10/2017    PCP: Almarie Waddell NOVAK, NP   REFERRING PROVIDER: Jason Leita Repine,*   REFERRING DIAG: [phone] (ICD-10-CM) - Chronic low back pain, unspecified back pain laterality, unspecified whether sciatica present   THERAPY DIAG:  Other low back pain  Muscle weakness (generalized)  Abnormal posture  Difficulty in walking, not elsewhere classified  RATIONALE FOR EVALUATION AND TREATMENT: Rehabilitation  ONSET DATE: 1 yr  NEXT MD VISIT:    SUBJECTIVE:                                                                                                                                                                                                         SUBJECTIVE STATEMENT: Pt reports she was OOT had a long drive which kind of aggravated her back   73 y/o referred to PT for LBP without sciatica.   Patient reports has had LBP about a year since she fell off of a ladder.  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  These machines do not bother her.   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  This bothers her back the most.   She reports the pain is central low back without any radiation or radiculopathy.  States always some dull level of pain, but that it can get sharp at times.   Bending and lifting are the most painful things for her.     PAIN:  Are you having pain? Yes: NPRS scale: 6/10 now; 1/10  at night; 6/10 worst over last week Pain location: central Low back Pain description: aching Aggravating factors: bending and lifting  Relieving factors: lying on back  PERTINENT HISTORY:  HTN, osteopenia, Cervical DDD, popliteus tendonitis  PRECAUTIONS: None  RED FLAGS: None  WEIGHT BEARING RESTRICTIONS: No  FALLS:  Has patient fallen in last 6 months? No  LIVING ENVIRONMENT: Lives with: lives alone Lives in: House/apartment Stairs: No Has following equipment at home: None  OCCUPATION: retired from Engineer, manufacturing systems for various healthcare facilities  PLOF: Independent with gait  PATIENT GOALS: relief the pain and get more mobility (in the back)   OBJECTIVE: (objective measures completed at initial evaluation unless otherwise dated)  DIAGNOSTIC FINDINGS:  CLINICAL DATA:  Low back pain 1 year since falling off a ladder. Pain is worse with activity.   EXAM: LUMBAR SPINE - COMPLETE 4+ VIEW   COMPARISON:  CT abdomen pelvis 01/28/2014   FINDINGS: No acute fracture or evidence of traumatic listhesis. Moderate disc space height loss at L5-S1. Moderate facet arthropathy at L4-L5 and L5-S1.   IMPRESSION: Moderate degenerative changes at L4-L5 and L5-S1.     Electronically Signed   By: Norman Gatlin M.D.   On: 06/08/2024 02:45CLINICAL DATA:  Chronic neck pain since 2017, with worsening for 3 months. Weakness in the right arm.   EXAM: MRI CERVICAL SPINE WITHOUT CONTRAST   TECHNIQUE: Multiplanar, multisequence MR imaging of the cervical spine  was performed. No intravenous contrast was administered.   COMPARISON:  11/23/2015   FINDINGS: Alignment: Physiologic.   Vertebrae: No fracture, evidence of discitis, or bone lesion.   Cord: Normal signal and morphology.   Posterior Fossa, vertebral arteries, paraspinal tissues: Negative.   Disc levels:   C2-3: Bulky left degenerative facet spurring. Small central protrusion. No impingement   C3-4: Fairly bulky degenerative facet spurring. Mild disc height loss and bulging with small central protrusion. The canal and foramina are patent   C4-5: Degenerative facet spurring with ankylosis. Mild disc height loss with tiny central protrusion. No neural impingement.   C5-6: Intervertebral and facet ankylosis with underlying ridging. No neural impingement.   C6-7: Degenerative facet spurring which is at least moderate on both sides. Mild disc bulging. Patent canal and foramina   C7-T1:Degenerative facet spurring on both sides, mild to moderate. Mild disc bulging. No neural impingement.   IMPRESSION: Generalized cervical spine degeneration especially affecting facets with facet ankylosis at C4-5 and C5-6. The spinal canal and foramina are diffusely patent. No marrow edema.     Electronically Signed   By: Dorn Roulette M.D.   On: 09/04/2023 07:14    PATIENT SURVEYS:  Modified Oswestry:  MODIFIED OSWESTRY DISABILITY SCALE  Date: 07/13/24 Score  Pain intensity 3 =  Pain medication provides me with moderate relief from pain.  2. Personal care (washing, dressing, etc.) 0 =  I can take care of myself normally without causing increased pain.  3. Lifting 3 = Pain prevents me from lifting heavy weights, but I can manage (5) I have hardly any social life because of my pain. light to medium weights if they are conveniently positioned  4. Walking 3 =  Pain prevents me from walking more than  mile.  5. Sitting 1 =  I can only sit in my favorite chair as long as I like.  6.  Standing 0 =  I can stand as long as I want without increased pain.  7. Sleeping 0 = Pain does not prevent me from sleeping well.  8. Social Life 0 = My social life is normal and does not increase my pain.  9. Traveling 1 =  I can travel anywhere, but it increases my pain.  10. Employment/ Homemaking 2 = I can perform most of my homemaking/job duties, but pain prevents me from performing more physically stressful activities (eg, lifting, vacuuming).  Total 13/50 = 26%   Interpretation of scores: Score Category Description  0-20% Minimal Disability The patient can cope with most living activities. Usually no treatment is indicated apart from advice on lifting, sitting and exercise  21-40% Moderate Disability The patient experiences more pain and difficulty with sitting, lifting and standing. Travel and social life are more difficult and they may be disabled from work. Personal care, sexual activity and sleeping are not grossly affected, and the patient can usually be managed by conservative means  41-60% Severe Disability Pain remains the main problem in this group, but activities of daily living are affected. These patients require a detailed investigation  61-80% Crippled Back pain impinges on all aspects of the patient's life. Positive intervention is required  81-100% Bed-bound  These patients are either bed-bound or exaggerating their symptoms  Bluford FORBES Zoe DELENA Karon DELENA, et al. Surgery versus conservative management of stable thoracolumbar fracture: the PRESTO feasibility RCT. [place] ([place]): [employer]; 2021 Nov. [facility] Technology Assessment, No. 25.62.) Appendix 3, Oswestry Disability Index category descriptors. Available from: FindJewel[employer].cz  Minimally Clinically Important Difference (MCID) = 12.8% odi =   SCREENING FOR RED FLAGS: Bowel or bladder incontinence: No Spinal tumors: No Cauda equina syndrome: No Compression fracture:  No Abdominal aneurysm: No  COGNITION:  Overall cognitive status: Within functional limits for tasks assessed    SENSATION: WFL  POSTURE:  increased lumbar lordosis and anterior pelvic tilt  PALPATION: TTP over B paraspinals, B quadratus, some over the SI joints BLE  LUMBAR ROM:   Active  Eval  Flexion To distal shins; p!  Extension 75%; more p!  Right lateral flexion To knee  Left lateral flexion To knee  Right rotation 75%  Left rotation 75%  (Blank rows = not tested)  MUSCLE LENGTH: Hamstrings: Right SLR = 80 deg; Left SLR = 73 deg with some end range back pain Thomas test: Right NT deg; Left gets it just to the table deg Hamstrings: mild tight BLE L>R Piriformis: minimal tightness BLE Hip flexors: mild to mod tightness   LOWER EXTREMITY ROM:     Active  Right eval Left eval  Hip flexion    Hip extension    Hip abduction    Hip adduction    Hip internal rotation    Hip external rotation    Knee flexion    Knee extension    Ankle dorsiflexion    Ankle plantarflexion    Ankle inversion    Ankle eversion    (Blank rows = not tested)  LOWER EXTREMITY MMT:    MMT Right eval Left eval  Hip flexion 4 4  Hip extension 4- 4-  Hip abduction 4- 4-  Hip adduction    Hip internal rotation 4+ 4+  Hip external rotation 5 5  Knee flexion 5 5  Knee extension 5 5  Ankle dorsiflexion 5 5  Ankle plantarflexion 5 5  Ankle inversion    Ankle eversion     (Blank rows = not tested)  LUMBAR SPECIAL TESTS:  Straight leg raise test: Positive,  Slump test: Negative, SI Compression/distraction test: Positive, FABER test: Positive, Gaenslen's test: Positive, Long sit test: Negative, and Thomas test: Negative Pelvic landmarks and malleoli appear equal in supine.  In standing L shoulder seems a little lower  FUNCTIONAL TESTS:    GAIT: Distance walked: no device Assistive device utilized: None Level of assistance: Complete Independence Gait pattern: has compensated  Trendelenburg BLE R >L, short quick steps with increased pelvic rotation bilaterally, accentuated lordosis Comments:    TODAY'S TREATMENT:  07/19/24 Standing hip extension x 10 BLE Supine LTR x 10 both way Supine pelvic tilts 10x3 Supine march + TrA BLE 2x10  Quadruped cat/cow x 10 Seated ab sets with orange pball 15x3 Seated lumbar flexion stretch 2x30 Nustep L5x63min  SELF CARE: Provided education on PT POC progression.    PATIENT EDUCATION:  Education details: PT eval findings, anticipated POC, and initial HEP  Person educated: Patient Education method: Explanation, Demonstration, Verbal cues, Tactile cues, and Handouts Education comprehension: verbalized understanding, verbal cues required, tactile cues required, and needs further education  HOME EXERCISE PROGRAM: Access Code: [email or web address] URL: [email or web address] Date: 07/13/2024 Prepared by: Garnette Montclair  Exercises - Supine Lower Trunk Rotation  - 1 x daily - 7 x weekly - 3 sets - 10 reps - Supine Posterior Pelvic Tilt  - 1 x daily - 7 x weekly - 3 sets - 10 reps - Supine Bridge  - 1 x daily - 7 x weekly - 3 sets - 10 reps - Sidelying Hip Abduction  - 1 x daily - 7 x weekly - 2 sets - 10 reps - Standing Hip Extension with Counter Support  - 1 x daily - 7 x weekly - 3 sets - 10 reps   ASSESSMENT:  CLINICAL IMPRESSION: Progressed core stab exercises with good tolerance today. Cues provided as needed to correct her form and technique. Will continue to progress as tolerance.   Eval: GYNETH HUBKA is a 74 y.o. female who was referred to physical therapy for evaluation and treatment for LBP.   Patient reports onset of LB pain beginning about a year ago after falling from ladder.   However, she has also been picking up her granddaughter a lot since she is keeping her during the daytime hours and this is very painful for her.   Pain is worse with flexion.   Patient has hyperlordotic spine with weak  abdominals and hip musculature.    Patient has deficits in lumbar ROM, mild B LE flexibility, BLE strength, abnormal posture, and TTP with abnormal muscle tension  which are interfering with ADLs and are impacting quality of life.  On Modified Oswestry patient scored 13/50 demonstrating 26% moderate disability.  Leonora will benefit from skilled PT to address above deficits to improve mobility and activity tolerance with decreased pain interference.   OBJECTIVE IMPAIRMENTS: Abnormal gait, difficulty walking, decreased ROM, decreased strength, postural dysfunction, and pain.   ACTIVITY LIMITATIONS: carrying, lifting, bending, and caring for others  PARTICIPATION LIMITATIONS: cleaning, laundry, and community activity  PERSONAL FACTORS: Age, Time since onset of injury/illness/exacerbation, and 1-2 comorbidities:   HTN, osteopenia, Cervical DDD, popliteus tendonitisare also affecting patient's functional outcome.   REHAB POTENTIAL: Good  CLINICAL DECISION MAKING: Evolving/moderate complexity  EVALUATION COMPLEXITY: Moderate   GOALS: Goals reviewed with patient? Yes  SHORT TERM GOALS: Target date: 08/10/2024   Patient will be independent with initial HEP to improve outcomes and carryover.  Baseline: 100% PT assist required for correct completion Goal status: MET- 07/19/24  2.  Patient will report 25% improvement in low back pain to improve QOL. Baseline: 6/10 worst Goal status: INITIAL   LONG TERM GOALS: Target date: 09/08/2024   Patient will be independent with ongoing/advanced HEP for self-management at home.  Baseline: no advanced HEP yet Goal status: INITIAL  2.  Patient will report 50-75% improvement in low back pain to improve QOL.  Baseline: 6/10 worst Goal status: INITIAL  3.  Patient to demonstrate ability to achieve and maintain good spinal alignment/posturing and body mechanics needed for daily activities. Baseline: hyperlordotic posture Goal status: INITIAL  4.   Patient will demonstrate full pain free lumbar ROM to perform ADLs.   Baseline: Refer to above lumbar ROM table Goal status: INITIAL  5.  Patient will demonstrate improved BLE strength to >/= 5/5 for improved stability and ease of mobility. Baseline: Refer to above LE MMT table Goal status: INITIAL  6. Patient will report </= 10% on Modified Oswestry (MCID = 12%) to demonstrate improved functional ability with decreased pain interference. Baseline: 26% Goal status: INITIAL  7.  Patient will tolerate 60 min of (standing/sitting/walking) w/o increased pain to allow for  improved mobility and activity tolerance. Baseline: can do it but with increased pain Goal status: INITIAL  PLAN:  PT FREQUENCY: 1-2x/week  PT DURATION: 8 weeks  PLANNED INTERVENTIONS: 97164- PT Re-evaluation, 97750- Physical Performance Testing, 97110-Therapeutic exercises, 97530- Therapeutic activity, 97112- Neuromuscular re-education, 97535- Self Care, [id number]- Manual therapy, [id number]- Gait training, [phone]- Aquatic Therapy, [phone]- Electrical stimulation (unattended), 97016- Vasopneumatic device, [id number]- Ultrasound, [id number]- Traction (mechanical), [id number]- Ionotophoresis 4mg /ml Dexamethasone, Patient/Family education, Balance training, Stair training, Taping, Joint mobilization, and Spinal mobilization  PLAN FOR NEXT SESSION: progress with core strength and stability (has pain w/ curl ups on eval but tolerated PPT), supine and quadruped stabilization ex;  modalities PRN (declined estim on eval but may want to try)   Sol LITTIE Gaskins, PTA 07/19/2024, 2:44 PM  Date of referral: see referral Referring provider: Jason Leita Repine, FNP Referring diagnosis? Chronic LBP; M54.50 Treatment diagnosis? (if different than referring diagnosis) low back pain, muscle weakness, abnormal posture  What was this (referring dx) caused by? Ongoing Issue  Lysle of Condition: Chronic (continuous duration > 3 months)   Laterality:  Both  Current Functional Measure Score: Back Index modified oswestry = 26%  Objective measurements identify impairments when they are compared to normal values, the uninvolved extremity, and prior level of function.  [x]  Yes  []  No  Objective assessment of functional ability: Moderate functional limitations   Briefly describe symptoms: 73 y/o referred to PT for LBP without sciatica.   Patient reports has had LBP about a year since she fell off of a ladder.  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  These machines do not bother her.   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  This bothers her back the most.   She reports the pain is central low back without any radiation or radiculopathy.  States always some dull level of pain, but that it can get sharp at times.   Bending and lifting are the most painful things for her.      How did symptoms start: after falling from ladder 2 yrs ago  Average pain intensity:  Last 24 hours: 6/10 worst  Past week: same  How often does the pt experience symptoms?  Constantly  How much have the symptoms interfered with usual daily activities? Quite a bit  How has condition changed since care began at this facility? No change  In general, how is the patients overall health? Good   BACK PAIN (STarT Back Screening Tool) Has pain spread down the leg(s) at some time in the last 2 weeks? no Has there been pain in the shoulder or neck at some time in the last 2 weeks? no Has the pt only walked short distances because of back pain? yes Has patient dressed more slowly because of back pain in the past 2 weeks? yes Does patient think it's not safe for a person with this condition to be physically active? no Does patient have worrying thoughts a lot of the time? no Does patient feel back pain is terrible and will never get any  better? no Has patient stopped enjoying things they usually enjoy? no

## 2024-07-22 ENCOUNTER — Other Ambulatory Visit: Payer: Self-pay | Admitting: Family Medicine

## 2024-07-22 ENCOUNTER — Other Ambulatory Visit (HOSPITAL_BASED_OUTPATIENT_CLINIC_OR_DEPARTMENT_OTHER): Payer: Self-pay

## 2024-07-22 ENCOUNTER — Ambulatory Visit: Attending: Family

## 2024-07-22 DIAGNOSIS — R293 Abnormal posture: Secondary | ICD-10-CM | POA: Diagnosis present

## 2024-07-22 DIAGNOSIS — R262 Difficulty in walking, not elsewhere classified: Secondary | ICD-10-CM | POA: Diagnosis present

## 2024-07-22 DIAGNOSIS — M6281 Muscle weakness (generalized): Secondary | ICD-10-CM | POA: Diagnosis present

## 2024-07-22 DIAGNOSIS — M5459 Other low back pain: Secondary | ICD-10-CM | POA: Insufficient documentation

## 2024-07-22 DIAGNOSIS — M542 Cervicalgia: Secondary | ICD-10-CM | POA: Diagnosis present

## 2024-07-22 MED ORDER — COMIRNATY 30 MCG/0.3ML IM SUSY
0.3000 mL | PREFILLED_SYRINGE | Freq: Once | INTRAMUSCULAR | 0 refills | Status: AC
  Filled 2024-07-22: qty 0.3, 1d supply, fill #0

## 2024-07-22 MED ORDER — FLUZONE HIGH-DOSE 0.5 ML IM SUSY
0.5000 mL | PREFILLED_SYRINGE | Freq: Once | INTRAMUSCULAR | 0 refills | Status: AC
  Filled 2024-07-22: qty 0.5, 1d supply, fill #0

## 2024-07-22 NOTE — Therapy (Signed)
 OUTPATIENT PHYSICAL THERAPY THORACOLUMBAR TREATMENT   Patient Name: [name] MRN: [id number] DOB:[birth year], 73 y.o., female Today's Date: 07/22/2024  END OF SESSION:  PT End of Session - 07/22/24 1044     Visit Number 3    Date for Recertification  09/07/24    PT Start Time 1019    PT Stop Time 1103    PT Time Calculation (min) 44 min            Past Medical History:  Diagnosis Date   Cancer (HCC)    Basal cell carcinoma of the nose.   Gastroparesis    Hyperlipidemia    Hypertension    Osteopenia    Past Surgical History:  Procedure Laterality Date   CATARACT EXTRACTION Bilateral    Patient Active Problem List   Diagnosis Date Noted   DDD (degenerative disc disease), cervical 07/25/2023   Popliteus tendinitis of right lower extremity 11/25/2022   Dyslipidemia, goal to be determined 02/10/2017   Essential hypertension 02/10/2017    PCP: Almarie Waddell NOVAK, NP   REFERRING PROVIDER: Jason Leita Repine,*   REFERRING DIAG: [id number] (ICD-10-CM) - Chronic low back pain, unspecified back pain laterality, unspecified whether sciatica present   THERAPY DIAG:  Other low back pain  Muscle weakness (generalized)  Abnormal posture  Difficulty in walking, not elsewhere classified  RATIONALE FOR EVALUATION AND TREATMENT: Rehabilitation  ONSET DATE: 1 yr  NEXT MD VISIT:    SUBJECTIVE:                                                                                                                                                                                                         SUBJECTIVE STATEMENT: Feeling much better today, no pain   73 y/o referred to PT for LBP without sciatica.   Patient reports has had LBP about a year since she fell off of a ladder.  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  These machines do not bother her.   However, she has also been keeping her granddaughter for about  a year now and is having to do a lot of picking her up/down.  This bothers her back the most.   She reports the pain is central low back without any radiation or radiculopathy.  States always some dull level of pain, but that it can get sharp at times.   Bending and lifting are the most painful things for her.     PAIN: Are you having pain? Yes: NPRS scale: 0/10  now; 1/10  at night; 6/10 worst over last week Pain location: central Low back Pain description: aching Aggravating factors: bending and lifting  Relieving factors: lying on back  PERTINENT HISTORY:  HTN, osteopenia, Cervical DDD, popliteus tendonitis  PRECAUTIONS: None  RED FLAGS: None  WEIGHT BEARING RESTRICTIONS: No  FALLS:  Has patient fallen in last 6 months? No  LIVING ENVIRONMENT: Lives with: lives alone Lives in: House/apartment Stairs: No Has following equipment at home: None  OCCUPATION: retired from Engineer, manufacturing systems for various healthcare facilities  PLOF: Independent with gait  PATIENT GOALS: relief the pain and get more mobility (in the back)   OBJECTIVE: (objective measures completed at initial evaluation unless otherwise dated)  DIAGNOSTIC FINDINGS:  CLINICAL DATA:  Low back pain 1 year since falling off a ladder. Pain is worse with activity.   EXAM: LUMBAR SPINE - COMPLETE 4+ VIEW   COMPARISON:  CT abdomen pelvis 01/28/2014   FINDINGS: No acute fracture or evidence of traumatic listhesis. Moderate disc space height loss at L5-S1. Moderate facet arthropathy at L4-L5 and L5-S1.   IMPRESSION: Moderate degenerative changes at L4-L5 and L5-S1.     Electronically Signed   By: Norman Gatlin M.D.   On: 06/08/2024 02:45CLINICAL DATA:  Chronic neck pain since 2017, with worsening for 3 months. Weakness in the right arm.   EXAM: MRI CERVICAL SPINE WITHOUT CONTRAST   TECHNIQUE: Multiplanar, multisequence MR imaging of the cervical spine was performed. No intravenous contrast was  administered.   COMPARISON:  11/23/2015   FINDINGS: Alignment: Physiologic.   Vertebrae: No fracture, evidence of discitis, or bone lesion.   Cord: Normal signal and morphology.   Posterior Fossa, vertebral arteries, paraspinal tissues: Negative.   Disc levels:   C2-3: Bulky left degenerative facet spurring. Small central protrusion. No impingement   C3-4: Fairly bulky degenerative facet spurring. Mild disc height loss and bulging with small central protrusion. The canal and foramina are patent   C4-5: Degenerative facet spurring with ankylosis. Mild disc height loss with tiny central protrusion. No neural impingement.   C5-6: Intervertebral and facet ankylosis with underlying ridging. No neural impingement.   C6-7: Degenerative facet spurring which is at least moderate on both sides. Mild disc bulging. Patent canal and foramina   C7-T1:Degenerative facet spurring on both sides, mild to moderate. Mild disc bulging. No neural impingement.   IMPRESSION: Generalized cervical spine degeneration especially affecting facets with facet ankylosis at C4-5 and C5-6. The spinal canal and foramina are diffusely patent. No marrow edema.     Electronically Signed   By: Dorn Roulette M.D.   On: 09/04/2023 07:14    PATIENT SURVEYS:  Modified Oswestry:  MODIFIED OSWESTRY DISABILITY SCALE  Date: 07/13/24 Score  Pain intensity 3 =  Pain medication provides me with moderate relief from pain.  2. Personal care (washing, dressing, etc.) 0 =  I can take care of myself normally without causing increased pain.  3. Lifting 3 = Pain prevents me from lifting heavy weights, but I can manage (5) I have hardly any social life because of my pain. light to medium weights if they are conveniently positioned  4. Walking 3 =  Pain prevents me from walking more than  mile.  5. Sitting 1 =  I can only sit in my favorite chair as long as I like.  6. Standing 0 =  I can stand as long as I want  without increased pain.  7. Sleeping 0 = Pain does not prevent  me from sleeping well.  8. Social Life 0 = My social life is normal and does not increase my pain.  9. Traveling 1 =  I can travel anywhere, but it increases my pain.  10. Employment/ Homemaking 2 = I can perform most of my homemaking/job duties, but pain prevents me from performing more physically stressful activities (eg, lifting, vacuuming).  Total 13/50 = 26%   Interpretation of scores: Score Category Description  0-20% Minimal Disability The patient can cope with most living activities. Usually no treatment is indicated apart from advice on lifting, sitting and exercise  21-40% Moderate Disability The patient experiences more pain and difficulty with sitting, lifting and standing. Travel and social life are more difficult and they may be disabled from work. Personal care, sexual activity and sleeping are not grossly affected, and the patient can usually be managed by conservative means  41-60% Severe Disability Pain remains the main problem in this group, but activities of daily living are affected. These patients require a detailed investigation  61-80% Crippled Back pain impinges on all aspects of the patient's life. Positive intervention is required  81-100% Bed-bound  These patients are either bed-bound or exaggerating their symptoms  Bluford FORBES Zoe DELENA Karon DELENA, et al. Surgery versus conservative management of stable thoracolumbar fracture: the PRESTO feasibility RCT. [place] ([place]): [employer]; 2021 Nov. [facility] Technology Assessment, No. 25.62.) Appendix 3, Oswestry Disability Index category descriptors. Available from: FindJewel[employer].cz  Minimally Clinically Important Difference (MCID) = 12.8% odi =   SCREENING FOR RED FLAGS: Bowel or bladder incontinence: No Spinal tumors: No Cauda equina syndrome: No Compression fracture: No Abdominal aneurysm: No  COGNITION:  Overall  cognitive status: Within functional limits for tasks assessed    SENSATION: WFL  POSTURE:  increased lumbar lordosis and anterior pelvic tilt  PALPATION: TTP over B paraspinals, B quadratus, some over the SI joints BLE  LUMBAR ROM:   Active  Eval  Flexion To distal shins; p!  Extension 75%; more p!  Right lateral flexion To knee  Left lateral flexion To knee  Right rotation 75%  Left rotation 75%  (Blank rows = not tested)  MUSCLE LENGTH: Hamstrings: Right SLR = 80 deg; Left SLR = 73 deg with some end range back pain Thomas test: Right NT deg; Left gets it just to the table deg Hamstrings: mild tight BLE L>R Piriformis: minimal tightness BLE Hip flexors: mild to mod tightness   LOWER EXTREMITY ROM:     Active  Right eval Left eval  Hip flexion    Hip extension    Hip abduction    Hip adduction    Hip internal rotation    Hip external rotation    Knee flexion    Knee extension    Ankle dorsiflexion    Ankle plantarflexion    Ankle inversion    Ankle eversion    (Blank rows = not tested)  LOWER EXTREMITY MMT:    MMT Right eval Left eval  Hip flexion 4 4  Hip extension 4- 4-  Hip abduction 4- 4-  Hip adduction    Hip internal rotation 4+ 4+  Hip external rotation 5 5  Knee flexion 5 5  Knee extension 5 5  Ankle dorsiflexion 5 5  Ankle plantarflexion 5 5  Ankle inversion    Ankle eversion     (Blank rows = not tested)  LUMBAR SPECIAL TESTS:  Straight leg raise test: Positive, Slump test: Negative, SI Compression/distraction test: Positive, FABER  test: Positive, Gaenslen's test: Positive, Long sit test: Negative, and Thomas test: Negative Pelvic landmarks and malleoli appear equal in supine.  In standing L shoulder seems a little lower  FUNCTIONAL TESTS:    GAIT: Distance walked: no device Assistive device utilized: None Level of assistance: Complete Independence Gait pattern: has compensated Trendelenburg BLE R >L, short quick steps with  increased pelvic rotation bilaterally, accentuated lordosis Comments:    TODAY'S TREATMENT:  07/22/24 Bike L2x45min Quadruped cat/cow x 10 Quadruped multifidi knee lift from airex x 15 B Supine PPT 20x3 Supine crunches arms straight out x 20 Supine sequential march each LE x 10 Seated PPT 20x3- tactile cues Seated lumbar flexion AROM  Standing pallof press GTB x 20 each way  07/19/24 Standing hip extension x 10 BLE Supine LTR x 10 both way Supine pelvic tilts 10x3 Supine march + TrA BLE 2x10  Quadruped cat/cow x 10 Seated ab sets with orange pball 15x3 Seated lumbar flexion stretch 2x30 Nustep L5x17min  SELF CARE: Provided education on PT POC progression.    PATIENT EDUCATION:  Education details: Updated HEP, instruction on PPT in standing/sitting to work on better postural alignment Person educated: Patient Education method: Programmer, multimedia, Facilities manager, Verbal cues, Tactile cues, and Handouts Education comprehension: verbalized understanding, verbal cues required, tactile cues required, and needs further education  HOME EXERCISE PROGRAM: Access Code: [email or web address] URL: [email or web address] Date: 07/22/2024 Prepared by: Lakeva Hollon  Exercises - Supine Lower Trunk Rotation  - 1 x daily - 7 x weekly - 3 sets - 10 reps - Supine Posterior Pelvic Tilt  - 1 x daily - 7 x weekly - 3 sets - 10 reps - Curl Up with Reach  - 1 x daily - 7 x weekly - 3 sets - 10 reps - Supine Bridge  - 1 x daily - 7 x weekly - 3 sets - 10 reps - Sidelying Hip Abduction  - 1 x daily - 7 x weekly - 2 sets - 10 reps - Standing Hip Extension with Counter Support  - 1 x daily - 7 x weekly - 3 sets - 10 reps - Seated Posterior Pelvic Tilt  - 1 x daily - 7 x weekly - 3 sets - 10 reps - Standing Anti-Rotation Press with Anchored Resistance  - 1 x daily - 7 x weekly - 3 sets - 10 reps   ASSESSMENT:  CLINICAL IMPRESSION: Pt able to complete interventions. Continued core strengthening,  emphasis on abdominal strengthening and PPT d/t hyperlordosis. Some cues required throughout session for form. Good response, no increase in pain.    Eval: [name] is a 73 y.o. female who was referred to physical therapy for evaluation and treatment for LBP.   Patient reports onset of LB pain beginning about a year ago after falling from ladder.   However, she has also been picking up her granddaughter a lot since she is keeping her during the daytime hours and this is very painful for her.   Pain is worse with flexion.   Patient has hyperlordotic spine with weak abdominals and hip musculature.    Patient has deficits in lumbar ROM, mild B LE flexibility, BLE strength, abnormal posture, and TTP with abnormal muscle tension  which are interfering with ADLs and are impacting quality of life.  On Modified Oswestry patient scored 13/50 demonstrating 26% moderate disability.  Madi will benefit from skilled PT to address above deficits to improve mobility and activity tolerance with decreased pain interference.   OBJECTIVE  IMPAIRMENTS: Abnormal gait, difficulty walking, decreased ROM, decreased strength, postural dysfunction, and pain.   ACTIVITY LIMITATIONS: carrying, lifting, bending, and caring for others  PARTICIPATION LIMITATIONS: cleaning, laundry, and community activity  PERSONAL FACTORS: Age, Time since onset of injury/illness/exacerbation, and 1-2 comorbidities:   HTN, osteopenia, Cervical DDD, popliteus tendonitisare also affecting patient's functional outcome.   REHAB POTENTIAL: Good  CLINICAL DECISION MAKING: Evolving/moderate complexity  EVALUATION COMPLEXITY: Moderate   GOALS: Goals reviewed with patient? Yes  SHORT TERM GOALS: Target date: 08/10/2024   Patient will be independent with initial HEP to improve outcomes and carryover.  Baseline: 100% PT assist required for correct completion Goal status: MET- 07/19/24  2.  Patient will report 25% improvement in low back  pain to improve QOL. Baseline: 6/10 worst Goal status: INITIAL   LONG TERM GOALS: Target date: 09/08/2024   Patient will be independent with ongoing/advanced HEP for self-management at home.  Baseline: no advanced HEP yet Goal status: INITIAL  2.  Patient will report 50-75% improvement in low back pain to improve QOL.  Baseline: 6/10 worst Goal status: INITIAL  3.  Patient to demonstrate ability to achieve and maintain good spinal alignment/posturing and body mechanics needed for daily activities. Baseline: hyperlordotic posture Goal status: INITIAL  4.  Patient will demonstrate full pain free lumbar ROM to perform ADLs.   Baseline: Refer to above lumbar ROM table Goal status: INITIAL  5.  Patient will demonstrate improved BLE strength to >/= 5/5 for improved stability and ease of mobility. Baseline: Refer to above LE MMT table Goal status: INITIAL  6. Patient will report </= 10% on Modified Oswestry (MCID = 12%) to demonstrate improved functional ability with decreased pain interference. Baseline: 26% Goal status: INITIAL  7.  Patient will tolerate 60 min of (standing/sitting/walking) w/o increased pain to allow for  improved mobility and activity tolerance. Baseline: can do it but with increased pain Goal status: INITIAL  PLAN:  PT FREQUENCY: 1-2x/week  PT DURATION: 8 weeks  PLANNED INTERVENTIONS: 97164- PT Re-evaluation, 97750- Physical Performance Testing, 97110-Therapeutic exercises, 97530- Therapeutic activity, 97112- Neuromuscular re-education, 97535- Self Care, [id number]- Manual therapy, [phone]- Gait training, [phone]- Aquatic Therapy, [phone]- Electrical stimulation (unattended), 97016- Vasopneumatic device, [id number]- Ultrasound, [id number]- Traction (mechanical), [id number]- Ionotophoresis 4mg /ml Dexamethasone, Patient/Family education, Balance training, Stair training, Taping, Joint mobilization, and Spinal mobilization  PLAN FOR NEXT SESSION: progress with core strength and  stability (has pain w/ curl ups on eval but tolerated PPT), supine and quadruped stabilization ex;  modalities PRN (declined estim on eval but may want to try)   Sol LITTIE Gaskins, PTA 07/22/2024, 11:03 AM  Date of referral: see referral Referring provider: Jason Leita Repine, FNP Referring diagnosis? Chronic LBP; M54.50 Treatment diagnosis? (if different than referring diagnosis) low back pain, muscle weakness, abnormal posture  What was this (referring dx) caused by? Ongoing Issue  Lysle of Condition: Chronic (continuous duration > 3 months)   Laterality: Both  Current Functional Measure Score: Back Index modified oswestry = 26%  Objective measurements identify impairments when they are compared to normal values, the uninvolved extremity, and prior level of function.  [x]  Yes  []  No  Objective assessment of functional ability: Moderate functional limitations   Briefly describe symptoms: 73 y/o referred to PT for LBP without sciatica.   Patient reports has had LBP about a year since she fell off of a ladder.  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  These machines do  not bother her.   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  This bothers her back the most.   She reports the pain is central low back without any radiation or radiculopathy.  States always some dull level of pain, but that it can get sharp at times.   Bending and lifting are the most painful things for her.      How did symptoms start: after falling from ladder 2 yrs ago  Average pain intensity:  Last 24 hours: 6/10 worst  Past week: same  How often does the pt experience symptoms? Constantly  How much have the symptoms interfered with usual daily activities? Quite a bit  How has condition changed since care began at this facility? No change  In general, how is the patients  overall health? Good   BACK PAIN (STarT Back Screening Tool) Has pain spread down the leg(s) at some time in the last 2 weeks? no Has there been pain in the shoulder or neck at some time in the last 2 weeks? no Has the pt only walked short distances because of back pain? yes Has patient dressed more slowly because of back pain in the past 2 weeks? yes Does patient think it's not safe for a person with this condition to be physically active? no Does patient have worrying thoughts a lot of the time? no Does patient feel back pain is terrible and will never get any better? no Has patient stopped enjoying things they usually enjoy? no

## 2024-07-26 ENCOUNTER — Ambulatory Visit

## 2024-07-26 DIAGNOSIS — R293 Abnormal posture: Secondary | ICD-10-CM

## 2024-07-26 DIAGNOSIS — M5459 Other low back pain: Secondary | ICD-10-CM | POA: Diagnosis not present

## 2024-07-26 DIAGNOSIS — M6281 Muscle weakness (generalized): Secondary | ICD-10-CM

## 2024-07-26 DIAGNOSIS — R262 Difficulty in walking, not elsewhere classified: Secondary | ICD-10-CM

## 2024-07-26 NOTE — Therapy (Signed)
 OUTPATIENT PHYSICAL THERAPY THORACOLUMBAR TREATMENT   Patient Name: [name] MRN: [id number] DOB:22-Feb-1951, 73 y.o., female Today's Date: 07/26/2024  END OF SESSION:  PT End of Session - 07/26/24 1100     Visit Number 4    Date for Recertification  09/07/24    PT Start Time 1017    PT Stop Time 1100    PT Time Calculation (min) 43 min             Past Medical History:  Diagnosis Date   Cancer (HCC)    Basal cell carcinoma of the nose.   Gastroparesis    Hyperlipidemia    Hypertension    Osteopenia    Past Surgical History:  Procedure Laterality Date   CATARACT EXTRACTION Bilateral    Patient Active Problem List   Diagnosis Date Noted   DDD (degenerative disc disease), cervical 07/25/2023   Popliteus tendinitis of right lower extremity 11/25/2022   Dyslipidemia, goal to be determined 02/10/2017   Essential hypertension 02/10/2017    PCP: Almarie Waddell NOVAK, NP   REFERRING PROVIDER: Jason Leita Repine,*   REFERRING DIAG: [phone] (ICD-10-CM) - Chronic low back pain, unspecified back pain laterality, unspecified whether sciatica present   THERAPY DIAG:  Other low back pain  Muscle weakness (generalized)  Abnormal posture  Difficulty in walking, not elsewhere classified  RATIONALE FOR EVALUATION AND TREATMENT: Rehabilitation  ONSET DATE: 1 yr  NEXT MD VISIT:    SUBJECTIVE:                                                                                                                                                                                                         SUBJECTIVE STATEMENT:    73 y/o referred to PT for LBP without sciatica.   Patient reports has had LBP about a year since she fell off of a ladder.  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  These machines do not bother her.   However, she has also been keeping her granddaughter for about a year now and is having to do a  lot of picking her up/down.  This bothers her back the most.   She reports the pain is central low back without any radiation or radiculopathy.  States always some dull level of pain, but that it can get sharp at times.   Bending and lifting are the most painful things for her.     PAIN: Are you having pain? Yes: NPRS scale: 0/10 now; 1/10  at  night; 6/10 worst over last week Pain location: central Low back Pain description: aching Aggravating factors: bending and lifting  Relieving factors: lying on back  PERTINENT HISTORY:  HTN, osteopenia, Cervical DDD, popliteus tendonitis  PRECAUTIONS: None  RED FLAGS: None  WEIGHT BEARING RESTRICTIONS: No  FALLS:  Has patient fallen in last 6 months? No  LIVING ENVIRONMENT: Lives with: lives alone Lives in: House/apartment Stairs: No Has following equipment at home: None  OCCUPATION: retired from Engineer, manufacturing systems for various healthcare facilities  PLOF: Independent with gait  PATIENT GOALS: relief the pain and get more mobility (in the back)   OBJECTIVE: (objective measures completed at initial evaluation unless otherwise dated)  DIAGNOSTIC FINDINGS:  CLINICAL DATA:  Low back pain 1 year since falling off a ladder. Pain is worse with activity.   EXAM: LUMBAR SPINE - COMPLETE 4+ VIEW   COMPARISON:  CT abdomen pelvis 01/28/2014   FINDINGS: No acute fracture or evidence of traumatic listhesis. Moderate disc space height loss at L5-S1. Moderate facet arthropathy at L4-L5 and L5-S1.   IMPRESSION: Moderate degenerative changes at L4-L5 and L5-S1.     Electronically Signed   By: Norman Gatlin M.D.   On: 06/08/2024 02:45CLINICAL DATA:  Chronic neck pain since 2017, with worsening for 3 months. Weakness in the right arm.   EXAM: MRI CERVICAL SPINE WITHOUT CONTRAST   TECHNIQUE: Multiplanar, multisequence MR imaging of the cervical spine was performed. No intravenous contrast was administered.   COMPARISON:   11/23/2015   FINDINGS: Alignment: Physiologic.   Vertebrae: No fracture, evidence of discitis, or bone lesion.   Cord: Normal signal and morphology.   Posterior Fossa, vertebral arteries, paraspinal tissues: Negative.   Disc levels:   C2-3: Bulky left degenerative facet spurring. Small central protrusion. No impingement   C3-4: Fairly bulky degenerative facet spurring. Mild disc height loss and bulging with small central protrusion. The canal and foramina are patent   C4-5: Degenerative facet spurring with ankylosis. Mild disc height loss with tiny central protrusion. No neural impingement.   C5-6: Intervertebral and facet ankylosis with underlying ridging. No neural impingement.   C6-7: Degenerative facet spurring which is at least moderate on both sides. Mild disc bulging. Patent canal and foramina   C7-T1:Degenerative facet spurring on both sides, mild to moderate. Mild disc bulging. No neural impingement.   IMPRESSION: Generalized cervical spine degeneration especially affecting facets with facet ankylosis at C4-5 and C5-6. The spinal canal and foramina are diffusely patent. No marrow edema.     Electronically Signed   By: Dorn Roulette M.D.   On: 09/04/2023 07:14    PATIENT SURVEYS:  Modified Oswestry:  MODIFIED OSWESTRY DISABILITY SCALE  Date: 07/13/24 Score  Pain intensity 3 =  Pain medication provides me with moderate relief from pain.  2. Personal care (washing, dressing, etc.) 0 =  I can take care of myself normally without causing increased pain.  3. Lifting 3 = Pain prevents me from lifting heavy weights, but I can manage (5) I have hardly any social life because of my pain. light to medium weights if they are conveniently positioned  4. Walking 3 =  Pain prevents me from walking more than  mile.  5. Sitting 1 =  I can only sit in my favorite chair as long as I like.  6. Standing 0 =  I can stand as long as I want without increased pain.  7. Sleeping  0 = Pain does not prevent me from sleeping well.  8. Social Life 0 = My social life is normal and does not increase my pain.  9. Traveling 1 =  I can travel anywhere, but it increases my pain.  10. Employment/ Homemaking 2 = I can perform most of my homemaking/job duties, but pain prevents me from performing more physically stressful activities (eg, lifting, vacuuming).  Total 13/50 = 26%   Interpretation of scores: Score Category Description  0-20% Minimal Disability The patient can cope with most living activities. Usually no treatment is indicated apart from advice on lifting, sitting and exercise  21-40% Moderate Disability The patient experiences more pain and difficulty with sitting, lifting and standing. Travel and social life are more difficult and they may be disabled from work. Personal care, sexual activity and sleeping are not grossly affected, and the patient can usually be managed by conservative means  41-60% Severe Disability Pain remains the main problem in this group, but activities of daily living are affected. These patients require a detailed investigation  61-80% Crippled Back pain impinges on all aspects of the patient's life. Positive intervention is required  81-100% Bed-bound  These patients are either bed-bound or exaggerating their symptoms  Bluford FORBES Zoe DELENA Karon DELENA, et al. Surgery versus conservative management of stable thoracolumbar fracture: the PRESTO feasibility RCT. [place] ([place]): [employer]; 2021 Nov. [facility] Technology Assessment, No. 25.62.) Appendix 3, Oswestry Disability Index category descriptors. Available from: FindJewel[employer].cz  Minimally Clinically Important Difference (MCID) = 12.8% odi =   SCREENING FOR RED FLAGS: Bowel or bladder incontinence: No Spinal tumors: No Cauda equina syndrome: No Compression fracture: No Abdominal aneurysm: No  COGNITION:  Overall cognitive status: Within functional  limits for tasks assessed    SENSATION: WFL  POSTURE:  increased lumbar lordosis and anterior pelvic tilt  PALPATION: TTP over B paraspinals, B quadratus, some over the SI joints BLE  LUMBAR ROM:   Active  Eval 07/26/24  Flexion To distal shins; p! Almost to ankles- pain 3/10  Extension 75%; more p! 50% limited  Right lateral flexion To knee To distal thigh  Left lateral flexion To knee To distal thigh  Right rotation 75% 60% limited  Left rotation 75% 60% limited  (Blank rows = not tested)  MUSCLE LENGTH: Hamstrings: Right SLR = 80 deg; Left SLR = 73 deg with some end range back pain Thomas test: Right NT deg; Left gets it just to the table deg Hamstrings: mild tight BLE L>R Piriformis: minimal tightness BLE Hip flexors: mild to mod tightness   LOWER EXTREMITY ROM:     Active  Right eval Left eval  Hip flexion    Hip extension    Hip abduction    Hip adduction    Hip internal rotation    Hip external rotation    Knee flexion    Knee extension    Ankle dorsiflexion    Ankle plantarflexion    Ankle inversion    Ankle eversion    (Blank rows = not tested)  LOWER EXTREMITY MMT:    MMT Right eval Left eval  Hip flexion 4 4  Hip extension 4- 4-  Hip abduction 4- 4-  Hip adduction    Hip internal rotation 4+ 4+  Hip external rotation 5 5  Knee flexion 5 5  Knee extension 5 5  Ankle dorsiflexion 5 5  Ankle plantarflexion 5 5  Ankle inversion    Ankle eversion     (Blank rows = not tested)  LUMBAR SPECIAL TESTS:  Straight leg raise test: Positive, Slump test: Negative, SI Compression/distraction test: Positive, FABER test: Positive, Gaenslen's test: Positive, Long sit test: Negative, and Thomas test: Negative Pelvic landmarks and malleoli appear equal in supine.  In standing L shoulder seems a little lower  FUNCTIONAL TESTS:    GAIT: Distance walked: no device Assistive device utilized: None Level of assistance: Complete Independence Gait pattern: has  compensated Trendelenburg BLE R >L, short quick steps with increased pelvic rotation bilaterally, accentuated lordosis Comments:    TODAY'S TREATMENT:  07/26/24 Nustep L5x52min Seated PPT 20x3 Lumbar AROM assessed Seated sidebend stretch with arm reach 2x30' B Standing pallof press blue TB x 10; trunk rotation x 10 each Standing cable rows 15lb x 10 Standing shoulder extension 10lb 2x10  07/22/24 Bike L2x57min Quadruped cat/cow x 10 Quadruped multifidi knee lift from airex x 15 B Supine PPT 20x3 Supine crunches arms straight out x 20 Supine sequential march each LE x 10 Seated PPT 20x3- tactile cues Seated lumbar flexion AROM  Standing pallof press GTB x 20 each way  07/19/24 Standing hip extension x 10 BLE Supine LTR x 10 both way Supine pelvic tilts 10x3 Supine march + TrA BLE 2x10  Quadruped cat/cow x 10 Seated ab sets with orange pball 15x3 Seated lumbar flexion stretch 2x30 Nustep L5x32min  SELF CARE: Provided education on PT POC progression.    PATIENT EDUCATION:  Education details: Updated HEP, instruction on PPT in standing/sitting to work on better postural alignment Person educated: Patient Education method: Programmer, multimedia, Facilities manager, Verbal cues, Tactile cues, and Handouts Education comprehension: verbalized understanding, verbal cues required, tactile cues required, and needs further education  HOME EXERCISE PROGRAM: Access Code: [email or web address] URL: [email or web address] Date: 07/22/2024 Prepared by: Ailyne Pawley  Exercises - Supine Lower Trunk Rotation  - 1 x daily - 7 x weekly - 3 sets - 10 reps - Supine Posterior Pelvic Tilt  - 1 x daily - 7 x weekly - 3 sets - 10 reps - Curl Up with Reach  - 1 x daily - 7 x weekly - 3 sets - 10 reps - Supine Bridge  - 1 x daily - 7 x weekly - 3 sets - 10 reps - Sidelying Hip Abduction  - 1 x daily - 7 x weekly - 2 sets - 10 reps - Standing Hip Extension with Counter Support  - 1 x daily - 7 x weekly -  3 sets - 10 reps - Seated Posterior Pelvic Tilt  - 1 x daily - 7 x weekly - 3 sets - 10 reps - Standing Anti-Rotation Press with Anchored Resistance  - 1 x daily - 7 x weekly - 3 sets - 10 reps   ASSESSMENT:  CLINICAL IMPRESSION: Pt able to complete interventions. Continued core strengthening, emphasis on abdominal strengthening and PPT d/t hyperlordosis. Some cues required throughout session for form. Good response, no increase in pain.   Eval: [name] is a 73 y.o. female who was referred to physical therapy for evaluation and treatment for LBP.   Patient reports onset of LB pain beginning about a year ago after falling from ladder.   However, she has also been picking up her granddaughter a lot since she is keeping her during the daytime hours and this is very painful for her.   Pain is worse with flexion.   Patient has hyperlordotic spine with weak abdominals and hip musculature.    Patient has deficits in lumbar ROM, mild B LE flexibility, BLE strength, abnormal posture,  and TTP with abnormal muscle tension  which are interfering with ADLs and are impacting quality of life.  On Modified Oswestry patient scored 13/50 demonstrating 26% moderate disability.  Sharmeka will benefit from skilled PT to address above deficits to improve mobility and activity tolerance with decreased pain interference.   OBJECTIVE IMPAIRMENTS: Abnormal gait, difficulty walking, decreased ROM, decreased strength, postural dysfunction, and pain.   ACTIVITY LIMITATIONS: carrying, lifting, bending, and caring for others  PARTICIPATION LIMITATIONS: cleaning, laundry, and community activity  PERSONAL FACTORS: Age, Time since onset of injury/illness/exacerbation, and 1-2 comorbidities:   HTN, osteopenia, Cervical DDD, popliteus tendonitisare also affecting patient's functional outcome.   REHAB POTENTIAL: Good  CLINICAL DECISION MAKING: Evolving/moderate complexity  EVALUATION COMPLEXITY:  Moderate   GOALS: Goals reviewed with patient? Yes  SHORT TERM GOALS: Target date: 08/10/2024   Patient will be independent with initial HEP to improve outcomes and carryover.  Baseline: 100% PT assist required for correct completion Goal status: MET- 07/19/24  2.  Patient will report 25% improvement in low back pain to improve QOL. Baseline: 6/10 worst Goal status: INITIAL   LONG TERM GOALS: Target date: 09/08/2024   Patient will be independent with ongoing/advanced HEP for self-management at home.  Baseline: no advanced HEP yet Goal status: INITIAL  2.  Patient will report 50-75% improvement in low back pain to improve QOL.  Baseline: 6/10 worst Goal status: INITIAL  3.  Patient to demonstrate ability to achieve and maintain good spinal alignment/posturing and body mechanics needed for daily activities. Baseline: hyperlordotic posture Goal status: INITIAL  4.  Patient will demonstrate full pain free lumbar ROM to perform ADLs.   Baseline: Refer to above lumbar ROM table Goal status: IN PROGRESS- 07/26/24  5.  Patient will demonstrate improved BLE strength to >/= 5/5 for improved stability and ease of mobility. Baseline: Refer to above LE MMT table Goal status: INITIAL  6. Patient will report </= 10% on Modified Oswestry (MCID = 12%) to demonstrate improved functional ability with decreased pain interference. Baseline: 26% Goal status: INITIAL  7.  Patient will tolerate 60 min of (standing/sitting/walking) w/o increased pain to allow for  improved mobility and activity tolerance. Baseline: can do it but with increased pain Goal status: INITIAL  PLAN:  PT FREQUENCY: 1-2x/week  PT DURATION: 8 weeks  PLANNED INTERVENTIONS: 97164- PT Re-evaluation, 97750- Physical Performance Testing, 97110-Therapeutic exercises, 97530- Therapeutic activity, 97112- Neuromuscular re-education, 97535- Self Care, 02859- Manual therapy, [phone]- Gait training, [phone]- Aquatic Therapy,  [phone]- Electrical stimulation (unattended), 97016- Vasopneumatic device, [id number]- Ultrasound, [id number]- Traction (mechanical), [id number]- Ionotophoresis 4mg /ml Dexamethasone, Patient/Family education, Balance training, Stair training, Taping, Joint mobilization, and Spinal mobilization  PLAN FOR NEXT SESSION: progress with core strength and stability (has pain w/ curl ups on eval but tolerated PPT), supine and quadruped stabilization ex;  modalities PRN (declined estim on eval but may want to try)   Sol LITTIE Gaskins, PTA 07/26/2024, 11:02 AM  Date of referral: see referral Referring provider: Jason Leita Repine, FNP Referring diagnosis? Chronic LBP; M54.50 Treatment diagnosis? (if different than referring diagnosis) low back pain, muscle weakness, abnormal posture  What was this (referring dx) caused by? Ongoing Issue  Lysle of Condition: Chronic (continuous duration > 3 months)   Laterality: Both  Current Functional Measure Score: Back Index modified oswestry = 26%  Objective measurements identify impairments when they are compared to normal values, the uninvolved extremity, and prior level of function.  [x]  Yes  []  No  Objective assessment of functional ability:  Moderate functional limitations   Briefly describe symptoms: 73 y/o referred to PT for LBP without sciatica.   Patient reports has had LBP about a year since she fell off of a ladder.  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  These machines do not bother her.   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  This bothers her back the most.   She reports the pain is central low back without any radiation or radiculopathy.  States always some dull level of pain, but that it can get sharp at times.   Bending and lifting are the most painful things for her.      How did symptoms start: after falling from ladder  2 yrs ago  Average pain intensity:  Last 24 hours: 6/10 worst  Past week: same  How often does the pt experience symptoms? Constantly  How much have the symptoms interfered with usual daily activities? Quite a bit  How has condition changed since care began at this facility? No change  In general, how is the patients overall health? Good   BACK PAIN (STarT Back Screening Tool) Has pain spread down the leg(s) at some time in the last 2 weeks? no Has there been pain in the shoulder or neck at some time in the last 2 weeks? no Has the pt only walked short distances because of back pain? yes Has patient dressed more slowly because of back pain in the past 2 weeks? yes Does patient think it's not safe for a person with this condition to be physically active? no Does patient have worrying thoughts a lot of the time? no Does patient feel back pain is terrible and will never get any better? no Has patient stopped enjoying things they usually enjoy? no

## 2024-07-29 ENCOUNTER — Ambulatory Visit: Admitting: Rehabilitation

## 2024-07-29 ENCOUNTER — Encounter: Payer: Self-pay | Admitting: Rehabilitation

## 2024-07-29 DIAGNOSIS — M5459 Other low back pain: Secondary | ICD-10-CM

## 2024-07-29 DIAGNOSIS — M6281 Muscle weakness (generalized): Secondary | ICD-10-CM

## 2024-07-29 DIAGNOSIS — R293 Abnormal posture: Secondary | ICD-10-CM

## 2024-07-29 DIAGNOSIS — R262 Difficulty in walking, not elsewhere classified: Secondary | ICD-10-CM

## 2024-07-29 NOTE — Therapy (Signed)
 OUTPATIENT PHYSICAL THERAPY THORACOLUMBAR TREATMENT   Patient Name: [name] MRN: [id number] DOB:[birth year], 73 y.o., female Today's Date: 07/29/2024  END OF SESSION:  PT End of Session - 07/29/24 1022     Visit Number 5    Date for Recertification  09/07/24    PT Start Time 1016    PT Stop Time 1100    PT Time Calculation (min) 44 min    Activity Tolerance Patient tolerated treatment well;No increased pain    Behavior During Therapy WFL for tasks assessed/performed             Past Medical History:  Diagnosis Date   Cancer (HCC)    Basal cell carcinoma of the nose.   Gastroparesis    Hyperlipidemia    Hypertension    Osteopenia    Past Surgical History:  Procedure Laterality Date   CATARACT EXTRACTION Bilateral    Patient Active Problem List   Diagnosis Date Noted   DDD (degenerative disc disease), cervical 07/25/2023   Popliteus tendinitis of right lower extremity 11/25/2022   Dyslipidemia, goal to be determined 02/10/2017   Essential hypertension 02/10/2017    PCP: Almarie Waddell NOVAK, NP   REFERRING PROVIDER: Jason Leita Repine,*   REFERRING DIAG: [phone] (ICD-10-CM) - Chronic low back pain, unspecified back pain laterality, unspecified whether sciatica present   THERAPY DIAG:  Other low back pain  Muscle weakness (generalized)  Abnormal posture  Difficulty in walking, not elsewhere classified  RATIONALE FOR EVALUATION AND TREATMENT: Rehabilitation  ONSET DATE: 1 yr  NEXT MD VISIT:    SUBJECTIVE:                                                                                                                                                                                                         SUBJECTIVE STATEMENT:  Patient reports not feeling any pain today.    73 y/o referred to PT for LBP without sciatica.   Patient reports has had LBP about a year since she fell off of a ladder.  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  These machines do not bother her.   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  This bothers her back the most.   She reports the pain is central low back without any radiation or radiculopathy.  States always some dull level of pain, but that it can get sharp at times.  Bending and lifting are the most painful things for her.     PAIN: Are you having pain? Yes: NPRS scale: 0/10 now; 1/10  at night; 6/10 worst over last week Pain location: central Low back Pain description: aching Aggravating factors: bending and lifting  Relieving factors: lying on back  PERTINENT HISTORY:  HTN, osteopenia, Cervical DDD, popliteus tendonitis  PRECAUTIONS: None  RED FLAGS: None  WEIGHT BEARING RESTRICTIONS: No  FALLS:  Has patient fallen in last 6 months? No  LIVING ENVIRONMENT: Lives with: lives alone Lives in: House/apartment Stairs: No Has following equipment at home: None  OCCUPATION: retired from Engineer, manufacturing systems for various healthcare facilities  PLOF: Independent with gait  PATIENT GOALS: relief the pain and get more mobility (in the back)   OBJECTIVE: (objective measures completed at initial evaluation unless otherwise dated)  DIAGNOSTIC FINDINGS:  CLINICAL DATA:  Low back pain 1 year since falling off a ladder. Pain is worse with activity.   EXAM: LUMBAR SPINE - COMPLETE 4+ VIEW   COMPARISON:  CT abdomen pelvis 01/28/2014   FINDINGS: No acute fracture or evidence of traumatic listhesis. Moderate disc space height loss at L5-S1. Moderate facet arthropathy at L4-L5 and L5-S1.   IMPRESSION: Moderate degenerative changes at L4-L5 and L5-S1.     Electronically Signed   By: Norman Gatlin M.D.   On: 06/08/2024 02:45CLINICAL DATA:  Chronic neck pain since 2017, with worsening for 3 months. Weakness in the right arm.   EXAM: MRI  CERVICAL SPINE WITHOUT CONTRAST   TECHNIQUE: Multiplanar, multisequence MR imaging of the cervical spine was performed. No intravenous contrast was administered.   COMPARISON:  11/23/2015   FINDINGS: Alignment: Physiologic.   Vertebrae: No fracture, evidence of discitis, or bone lesion.   Cord: Normal signal and morphology.   Posterior Fossa, vertebral arteries, paraspinal tissues: Negative.   Disc levels:   C2-3: Bulky left degenerative facet spurring. Small central protrusion. No impingement   C3-4: Fairly bulky degenerative facet spurring. Mild disc height loss and bulging with small central protrusion. The canal and foramina are patent   C4-5: Degenerative facet spurring with ankylosis. Mild disc height loss with tiny central protrusion. No neural impingement.   C5-6: Intervertebral and facet ankylosis with underlying ridging. No neural impingement.   C6-7: Degenerative facet spurring which is at least moderate on both sides. Mild disc bulging. Patent canal and foramina   C7-T1:Degenerative facet spurring on both sides, mild to moderate. Mild disc bulging. No neural impingement.   IMPRESSION: Generalized cervical spine degeneration especially affecting facets with facet ankylosis at C4-5 and C5-6. The spinal canal and foramina are diffusely patent. No marrow edema.     Electronically Signed   By: Dorn Roulette M.D.   On: 09/04/2023 07:14    PATIENT SURVEYS:  Modified Oswestry:  MODIFIED OSWESTRY DISABILITY SCALE  Date: 07/13/24 Score  Pain intensity 3 =  Pain medication provides me with moderate relief from pain.  2. Personal care (washing, dressing, etc.) 0 =  I can take care of myself normally without causing increased pain.  3. Lifting 3 = Pain prevents me from lifting heavy weights, but I can manage (5) I have hardly any social life because of my pain. light to medium weights if they are conveniently positioned  4. Walking 3 =  Pain prevents me from  walking more than  mile.  5. Sitting 1 =  I can only sit in my favorite chair as long as I like.  6. Standing  0 =  I can stand as long as I want without increased pain.  7. Sleeping 0 = Pain does not prevent me from sleeping well.  8. Social Life 0 = My social life is normal and does not increase my pain.  9. Traveling 1 =  I can travel anywhere, but it increases my pain.  10. Employment/ Homemaking 2 = I can perform most of my homemaking/job duties, but pain prevents me from performing more physically stressful activities (eg, lifting, vacuuming).  Total 13/50 = 26%   Interpretation of scores: Score Category Description  0-20% Minimal Disability The patient can cope with most living activities. Usually no treatment is indicated apart from advice on lifting, sitting and exercise  21-40% Moderate Disability The patient experiences more pain and difficulty with sitting, lifting and standing. Travel and social life are more difficult and they may be disabled from work. Personal care, sexual activity and sleeping are not grossly affected, and the patient can usually be managed by conservative means  41-60% Severe Disability Pain remains the main problem in this group, but activities of daily living are affected. These patients require a detailed investigation  61-80% Crippled Back pain impinges on all aspects of the patient's life. Positive intervention is required  81-100% Bed-bound  These patients are either bed-bound or exaggerating their symptoms  Bluford FORBES Zoe DELENA Karon DELENA, et al. Surgery versus conservative management of stable thoracolumbar fracture: the PRESTO feasibility RCT. [place] ([place]): [employer]; 2021 Nov. [facility] Technology Assessment, No. 25.62.) Appendix 3, Oswestry Disability Index category descriptors. Available from: FindJewel[employer].cz  Minimally Clinically Important Difference (MCID) = 12.8% odi =   SCREENING FOR RED FLAGS: Bowel or  bladder incontinence: No Spinal tumors: No Cauda equina syndrome: No Compression fracture: No Abdominal aneurysm: No  COGNITION:  Overall cognitive status: Within functional limits for tasks assessed    SENSATION: WFL  POSTURE:  increased lumbar lordosis and anterior pelvic tilt  PALPATION: TTP over B paraspinals, B quadratus, some over the SI joints BLE  LUMBAR ROM:   Active  Eval 07/26/24  Flexion To distal shins; p! Almost to ankles- pain 3/10  Extension 75%; more p! 50% limited  Right lateral flexion To knee To distal thigh  Left lateral flexion To knee To distal thigh  Right rotation 75% 60% limited  Left rotation 75% 60% limited  (Blank rows = not tested)  MUSCLE LENGTH: Hamstrings: Right SLR = 80 deg; Left SLR = 73 deg with some end range back pain Thomas test: Right NT deg; Left gets it just to the table deg Hamstrings: mild tight BLE L>R Piriformis: minimal tightness BLE Hip flexors: mild to mod tightness   LOWER EXTREMITY ROM:     Active  Right eval Left eval  Hip flexion    Hip extension    Hip abduction    Hip adduction    Hip internal rotation    Hip external rotation    Knee flexion    Knee extension    Ankle dorsiflexion    Ankle plantarflexion    Ankle inversion    Ankle eversion    (Blank rows = not tested)  LOWER EXTREMITY MMT:    MMT Right eval Left eval  Hip flexion 4 4  Hip extension 4- 4-  Hip abduction 4- 4-  Hip adduction    Hip internal rotation 4+ 4+  Hip external rotation 5 5  Knee flexion 5 5  Knee extension 5 5  Ankle dorsiflexion 5  5  Ankle plantarflexion 5 5  Ankle inversion    Ankle eversion     (Blank rows = not tested)  LUMBAR SPECIAL TESTS:  Straight leg raise test: Positive, Slump test: Negative, SI Compression/distraction test: Positive, FABER test: Positive, Gaenslen's test: Positive, Long sit test: Negative, and Thomas test: Negative Pelvic landmarks and malleoli appear equal in supine.  In standing L  shoulder seems a little lower  FUNCTIONAL TESTS:    GAIT: Distance walked: no device Assistive device utilized: None Level of assistance: Complete Independence Gait pattern: has compensated Trendelenburg BLE R >L, short quick steps with increased pelvic rotation bilaterally, accentuated lordosis Comments:    TODAY'S TREATMENT:  07/29/24 THERAPEUTIC EXERCISE: To improve strength and endurance.  Demonstration, verbal and tactile cues throughout for technique. Nu Step L5 x 8'  THERAPEUTIC ACTIVITIES: To improve functional performance.  Demonstration, verbal and tactile cues throughout for technique. Pallof press RTB x 2/10 B Standing rowing 10# x 15 BUE Standing straight arm Lat PD 10# x 2/10 BUE  NEUROMUSCULAR RE-EDUCATION: To improve kinesthesia, posture, and proprioception. Supine PPT x 10 Supine PPT w/ bridge x 10 BLE Supine PPT/bridge/march x 2/10 BLE Supine ab curl up fingertips to knees x 2/10 Quadruped cat x 10 Quadruped PPT/hip extension w/ yardstick on back for neuroreeducation x 2/10 BLE Counter plank w/ hip ext x 3/5 BLE   07/26/24 Nustep L5x31min Seated PPT 20x3 Lumbar AROM assessed Seated sidebend stretch with arm reach 2x30' B Standing pallof press blue TB x 10; trunk rotation x 10 each Standing cable rows 15lb x 10 Standing shoulder extension 10lb 2x10  07/22/24 Bike L2x21min Quadruped cat/cow x 10 Quadruped multifidi knee lift from airex x 15 B Supine PPT 20x3 Supine crunches arms straight out x 20 Supine sequential march each LE x 10 Seated PPT 20x3- tactile cues Seated lumbar flexion AROM  Standing pallof press GTB x 20 each way  07/19/24 Standing hip extension x 10 BLE Supine LTR x 10 both way Supine pelvic tilts 10x3 Supine march + TrA BLE 2x10  Quadruped cat/cow x 10 Seated ab sets with orange pball 15x3 Seated lumbar flexion stretch 2x30 Nustep L5x29min  SELF CARE: Provided education on PT POC progression.    PATIENT EDUCATION:   Education details: continued instruction w/ verbal and tactile cueing for proper performance of PPT w/ ab contraction Person educated: Patient Education method: Explanation, Demonstration, Verbal cues, Tactile cues, and Handouts Education comprehension: verbalized understanding, verbal cues required, tactile cues required, and needs further education  HOME EXERCISE PROGRAM: Access Code: [email or web address] URL: [email or web address] Date: 07/22/2024 Prepared by: Braylin Clark  Exercises - Supine Lower Trunk Rotation  - 1 x daily - 7 x weekly - 3 sets - 10 reps - Supine Posterior Pelvic Tilt  - 1 x daily - 7 x weekly - 3 sets - 10 reps - Curl Up with Reach  - 1 x daily - 7 x weekly - 3 sets - 10 reps - Supine Bridge  - 1 x daily - 7 x weekly - 3 sets - 10 reps - Sidelying Hip Abduction  - 1 x daily - 7 x weekly - 2 sets - 10 reps - Standing Hip Extension with Counter Support  - 1 x daily - 7 x weekly - 3 sets - 10 reps - Seated Posterior Pelvic Tilt  - 1 x daily - 7 x weekly - 3 sets - 10 reps - Standing Anti-Rotation Press with Anchored Resistance  - 1 x daily -  7 x weekly - 3 sets - 10 reps   ASSESSMENT:  CLINICAL IMPRESSION: Patient is doing better with regards to her back pain.   She feels therapy is really helping.   She is able to advance to more difficult lumbar stabilization exercises today.  PPT lumbar positioning continues to be challenging for her.   She continues to require lots of instruction with tactile facilitation for posterior pelvic tilt position maintenance with her exercises.  However, by end of session she is doing better with holding PPT.  PT remains necessary for pain, weakness, ROM deficits.  Continue per poc   Eval: [name] is a 73 y.o. female who was referred to physical therapy for evaluation and treatment for LBP.   Patient reports onset of LB pain beginning about a year ago after falling from ladder.   However, she has also been picking up her  granddaughter a lot since she is keeping her during the daytime hours and this is very painful for her.   Pain is worse with flexion.   Patient has hyperlordotic spine with weak abdominals and hip musculature.    Patient has deficits in lumbar ROM, mild B LE flexibility, BLE strength, abnormal posture, and TTP with abnormal muscle tension  which are interfering with ADLs and are impacting quality of life.  On Modified Oswestry patient scored 13/50 demonstrating 26% moderate disability.  [place] will benefit from skilled PT to address above deficits to improve mobility and activity tolerance with decreased pain interference.   OBJECTIVE IMPAIRMENTS: Abnormal gait, difficulty walking, decreased ROM, decreased strength, postural dysfunction, and pain.   ACTIVITY LIMITATIONS: carrying, lifting, bending, and caring for others  PARTICIPATION LIMITATIONS: cleaning, laundry, and community activity  PERSONAL FACTORS: Age, Time since onset of injury/illness/exacerbation, and 1-2 comorbidities:   HTN, osteopenia, Cervical DDD, popliteus tendonitisare also affecting patient's functional outcome.   REHAB POTENTIAL: Good  CLINICAL DECISION MAKING: Evolving/moderate complexity  EVALUATION COMPLEXITY: Moderate   GOALS: Goals reviewed with patient? Yes  SHORT TERM GOALS: Target date: 08/10/2024   Patient will be independent with initial HEP to improve outcomes and carryover.  Baseline: 100% PT assist required for correct completion Goal status: MET- 07/19/24  2.  Patient will report 25% improvement in low back pain to improve QOL. Baseline: 6/10 worst 07/29/24:  3/10 worst Goal status: MET   LONG TERM GOALS: Target date: 09/08/2024   Patient will be independent with ongoing/advanced HEP for self-management at home.  Baseline: no advanced HEP yet 07/29/24:  advanced today Goal status: IN PROGRESS  2.  Patient will report 50-75% improvement in low back pain to improve QOL.  Baseline: 6/10  worst 07/29/24:  3/10 worst Goal status: IN PROGRESS  3.  Patient to demonstrate ability to achieve and maintain good spinal alignment/posturing and body mechanics needed for daily activities. Baseline: hyperlordotic posture 10/9/25She can correct with manual cues Goal status: IN PROGRESS  4.  Patient will demonstrate full pain free lumbar ROM to perform ADLs.   Baseline: Refer to above lumbar ROM table Goal status: IN PROGRESS- 07/26/24  5.  Patient will demonstrate improved BLE strength to >/= 5/5 for improved stability and ease of mobility. Baseline: Refer to above LE MMT table Goal status: INITIAL  6. Patient will report </= 10% on Modified Oswestry (MCID = 12%) to demonstrate improved functional ability with decreased pain interference. Baseline: 26% Goal status: INITIAL  7.  Patient will tolerate 60 min of (standing/sitting/walking) w/o increased pain to allow for  improved mobility and activity tolerance. Baseline: can do it but with increased pain Goal status: INITIAL  PLAN:  PT FREQUENCY: 1-2x/week  PT DURATION: 8 weeks  PLANNED INTERVENTIONS: 97164- PT Re-evaluation, 97750- Physical Performance Testing, 97110-Therapeutic exercises, 97530- Therapeutic activity, [id number]- Neuromuscular re-education, 97535- Self Care, [id number]- Manual therapy, [phone]- Gait training, [phone]- Aquatic Therapy, [phone]- Electrical stimulation (unattended), 97016- Vasopneumatic device, [id number]- Ultrasound, [id number]- Traction (mechanical), [id number]- Ionotophoresis 4mg /ml Dexamethasone, Patient/Family education, Balance training, Stair training, Taping, Joint mobilization, and Spinal mobilization  PLAN FOR NEXT SESSION: Continue with lumbar stabilization, PPT exercises, core/abdominal strength  Nayanna Seaborn, PT 07/29/2024, 11:13 AM  Date of referral: see referral Referring provider: Jason Leita Repine, FNP Referring diagnosis? Chronic LBP; M54.50 Treatment diagnosis? (if different than referring  diagnosis) low back pain, muscle weakness, abnormal posture  What was this (referring dx) caused by? Ongoing Issue  Lysle of Condition: Chronic (continuous duration > 3 months)   Laterality: Both  Current Functional Measure Score: Back Index modified oswestry = 26%  Objective measurements identify impairments when they are compared to normal values, the uninvolved extremity, and prior level of function.  [x]  Yes  []  No  Objective assessment of functional ability: Moderate functional limitations   Briefly describe symptoms: 73 y/o referred to PT for LBP without sciatica.   Patient reports has had LBP about a year since she fell off of a ladder.  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  These machines do not bother her.   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  This bothers her back the most.   She reports the pain is central low back without any radiation or radiculopathy.  States always some dull level of pain, but that it can get sharp at times.   Bending and lifting are the most painful things for her.      How did symptoms start: after falling from ladder 2 yrs ago  Average pain intensity:  Last 24 hours: 6/10 worst  Past week: same  How often does the pt experience symptoms? Constantly  How much have the symptoms interfered with usual daily activities? Quite a bit  How has condition changed since care began at this facility? No change  In general, how is the patients overall health? Good   BACK PAIN (STarT Back Screening Tool) Has pain spread down the leg(s) at some time in the last 2 weeks? no Has there been pain in the shoulder or neck at some time in the last 2 weeks? no Has the pt only walked short distances because of back pain? yes Has patient dressed more slowly because of back pain in the past 2 weeks? yes Does patient think it's  not safe for a person with this condition to be physically active? no Does patient have worrying thoughts a lot of the time? no Does patient feel back pain is terrible and will never get any better? no Has patient stopped enjoying things they usually enjoy? no

## 2024-08-02 ENCOUNTER — Ambulatory Visit

## 2024-08-02 DIAGNOSIS — M6281 Muscle weakness (generalized): Secondary | ICD-10-CM

## 2024-08-02 DIAGNOSIS — R293 Abnormal posture: Secondary | ICD-10-CM

## 2024-08-02 DIAGNOSIS — M5459 Other low back pain: Secondary | ICD-10-CM

## 2024-08-02 DIAGNOSIS — R262 Difficulty in walking, not elsewhere classified: Secondary | ICD-10-CM

## 2024-08-02 NOTE — Therapy (Signed)
 OUTPATIENT PHYSICAL THERAPY THORACOLUMBAR TREATMENT   Patient Name: [name] MRN: [id number] DOB:1950-11-22, 73 y.o., female Today's Date: 08/02/2024  END OF SESSION:  PT End of Session - 08/02/24 1022     Visit Number 6    Date for Recertification  09/07/24    PT Start Time 1018    PT Stop Time 1100    PT Time Calculation (min) 42 min    Activity Tolerance Patient tolerated treatment well;No increased pain    Behavior During Therapy WFL for tasks assessed/performed             Past Medical History:  Diagnosis Date   Cancer (HCC)    Basal cell carcinoma of the nose.   Gastroparesis    Hyperlipidemia    Hypertension    Osteopenia    Past Surgical History:  Procedure Laterality Date   CATARACT EXTRACTION Bilateral    Patient Active Problem List   Diagnosis Date Noted   DDD (degenerative disc disease), cervical 07/25/2023   Popliteus tendinitis of right lower extremity 11/25/2022   Dyslipidemia, goal to be determined 02/10/2017   Essential hypertension 02/10/2017    PCP: Almarie Waddell NOVAK, NP   REFERRING PROVIDER: Jason Leita Repine,*   REFERRING DIAG: [id number] (ICD-10-CM) - Chronic low back pain, unspecified back pain laterality, unspecified whether sciatica present   THERAPY DIAG:  Other low back pain  Muscle weakness (generalized)  Abnormal posture  Difficulty in walking, not elsewhere classified  RATIONALE FOR EVALUATION AND TREATMENT: Rehabilitation  ONSET DATE: 1 yr  NEXT MD VISIT:    SUBJECTIVE:                                                                                                                                                                                                         SUBJECTIVE STATEMENT:  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   Patient reports has had LBP about a year since she fell  off of a ladder.  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  These machines do not bother her.   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  This bothers her back the most.   She reports the pain is central low back without any radiation or radiculopathy.  States always some  dull level of pain, but that it can get sharp at times.   Bending and lifting are the most painful things for her.     PAIN: Are you having pain? Yes: NPRS scale: 0/10 now; 1/10  at night; 6/10 worst over last week Pain location: central Low back Pain description: aching Aggravating factors: bending and lifting  Relieving factors: lying on back  PERTINENT HISTORY:  HTN, osteopenia, Cervical DDD, popliteus tendonitis  PRECAUTIONS: None  RED FLAGS: None  WEIGHT BEARING RESTRICTIONS: No  FALLS:  Has patient fallen in last 6 months? No  LIVING ENVIRONMENT: Lives with: lives alone Lives in: House/apartment Stairs: No Has following equipment at home: None  OCCUPATION: retired from Engineer, manufacturing systems for various healthcare facilities  PLOF: Independent with gait  PATIENT GOALS: relief the pain and get more mobility (in the back)   OBJECTIVE: (objective measures completed at initial evaluation unless otherwise dated)  DIAGNOSTIC FINDINGS:  CLINICAL DATA:  Low back pain 1 year since falling off a ladder. Pain is worse with activity.   EXAM: LUMBAR SPINE - COMPLETE 4+ VIEW   COMPARISON:  CT abdomen pelvis 01/28/2014   FINDINGS: No acute fracture or evidence of traumatic listhesis. Moderate disc space height loss at L5-S1. Moderate facet arthropathy at L4-L5 and L5-S1.   IMPRESSION: Moderate degenerative changes at L4-L5 and L5-S1.     Electronically Signed   By: Norman Gatlin M.D.   On: 06/08/2024 02:45CLINICAL DATA:  Chronic neck pain since 2017, with worsening for  3 months. Weakness in the right arm.   EXAM: MRI CERVICAL SPINE WITHOUT CONTRAST   TECHNIQUE: Multiplanar, multisequence MR imaging of the cervical spine was performed. No intravenous contrast was administered.   COMPARISON:  11/23/2015   FINDINGS: Alignment: Physiologic.   Vertebrae: No fracture, evidence of discitis, or bone lesion.   Cord: Normal signal and morphology.   Posterior Fossa, vertebral arteries, paraspinal tissues: Negative.   Disc levels:   C2-3: Bulky left degenerative facet spurring. Small central protrusion. No impingement   C3-4: Fairly bulky degenerative facet spurring. Mild disc height loss and bulging with small central protrusion. The canal and foramina are patent   C4-5: Degenerative facet spurring with ankylosis. Mild disc height loss with tiny central protrusion. No neural impingement.   C5-6: Intervertebral and facet ankylosis with underlying ridging. No neural impingement.   C6-7: Degenerative facet spurring which is at least moderate on both sides. Mild disc bulging. Patent canal and foramina   C7-T1:Degenerative facet spurring on both sides, mild to moderate. Mild disc bulging. No neural impingement.   IMPRESSION: Generalized cervical spine degeneration especially affecting facets with facet ankylosis at C4-5 and C5-6. The spinal canal and foramina are diffusely patent. No marrow edema.     Electronically Signed   By: Dorn Roulette M.D.   On: 09/04/2023 07:14    PATIENT SURVEYS:  Modified Oswestry:  MODIFIED OSWESTRY DISABILITY SCALE  Date: 07/13/24 Score  Pain intensity 3 =  Pain medication provides me with moderate relief from pain.  2. Personal care (washing, dressing, etc.) 0 =  I can take care of myself normally without causing increased pain.  3. Lifting 3 = Pain prevents me from lifting heavy weights, but I can manage (5) I have hardly any social life because of my pain. light to medium weights if they are conveniently  positioned  4. Walking 3 =  Pain prevents me from walking more than  mile.  5. Sitting 1 =  I can  only sit in my favorite chair as long as I like.  6. Standing 0 =  I can stand as long as I want without increased pain.  7. Sleeping 0 = Pain does not prevent me from sleeping well.  8. Social Life 0 = My social life is normal and does not increase my pain.  9. Traveling 1 =  I can travel anywhere, but it increases my pain.  10. Employment/ Homemaking 2 = I can perform most of my homemaking/job duties, but pain prevents me from performing more physically stressful activities (eg, lifting, vacuuming).  Total 13/50 = 26%   Interpretation of scores: Score Category Description  0-20% Minimal Disability The patient can cope with most living activities. Usually no treatment is indicated apart from advice on lifting, sitting and exercise  21-40% Moderate Disability The patient experiences more pain and difficulty with sitting, lifting and standing. Travel and social life are more difficult and they may be disabled from work. Personal care, sexual activity and sleeping are not grossly affected, and the patient can usually be managed by conservative means  41-60% Severe Disability Pain remains the main problem in this group, but activities of daily living are affected. These patients require a detailed investigation  61-80% Crippled Back pain impinges on all aspects of the patient's life. Positive intervention is required  81-100% Bed-bound  These patients are either bed-bound or exaggerating their symptoms  Bluford FORBES Zoe DELENA Karon DELENA, et al. Surgery versus conservative management of stable thoracolumbar fracture: the PRESTO feasibility RCT. [place] ([place]): [employer]; 2021 Nov. [facility] Technology Assessment, No. 25.62.) Appendix 3, Oswestry Disability Index category descriptors. Available from: FindJewelers.cz  Minimally Clinically Important Difference (MCID) =  12.8% odi =   SCREENING FOR RED FLAGS: Bowel or bladder incontinence: No Spinal tumors: No Cauda equina syndrome: No Compression fracture: No Abdominal aneurysm: No  COGNITION:  Overall cognitive status: Within functional limits for tasks assessed    SENSATION: WFL  POSTURE:  increased lumbar lordosis and anterior pelvic tilt  PALPATION: TTP over B paraspinals, B quadratus, some over the SI joints BLE  LUMBAR ROM:   Active  Eval 07/26/24  Flexion To distal shins; p! Almost to ankles- pain 3/10  Extension 75%; more p! 50% limited  Right lateral flexion To knee To distal thigh  Left lateral flexion To knee To distal thigh  Right rotation 75% 60% limited  Left rotation 75% 60% limited  (Blank rows = not tested)  MUSCLE LENGTH: Hamstrings: Right SLR = 80 deg; Left SLR = 73 deg with some end range back pain Thomas test: Right NT deg; Left gets it just to the table deg Hamstrings: mild tight BLE L>R Piriformis: minimal tightness BLE Hip flexors: mild to mod tightness   LOWER EXTREMITY ROM:     Active  Right eval Left eval  Hip flexion    Hip extension    Hip abduction    Hip adduction    Hip internal rotation    Hip external rotation    Knee flexion    Knee extension    Ankle dorsiflexion    Ankle plantarflexion    Ankle inversion    Ankle eversion    (Blank rows = not tested)  LOWER EXTREMITY MMT:    MMT Right eval Left eval R 08/02/24 L  08/02/24  Hip flexion 4 4 5 5   Hip extension 4- 4- 4+ 4  Hip abduction 4- 4- 5 5  Hip adduction  Hip internal rotation 4+ 4+ 4+ 4+  Hip external rotation 5 5    Knee flexion 5 5    Knee extension 5 5    Ankle dorsiflexion 5 5    Ankle plantarflexion 5 5    Ankle inversion      Ankle eversion       (Blank rows = not tested)  LUMBAR SPECIAL TESTS:  Straight leg raise test: Positive, Slump test: Negative, SI Compression/distraction test: Positive, FABER test: Positive, Gaenslen's test: Positive, Long sit test:  Negative, and Thomas test: Negative Pelvic landmarks and malleoli appear equal in supine.  In standing L shoulder seems a little lower  FUNCTIONAL TESTS:    GAIT: Distance walked: no device Assistive device utilized: None Level of assistance: Complete Independence Gait pattern: has compensated Trendelenburg BLE R >L, short quick steps with increased pelvic rotation bilaterally, accentuated lordosis Comments:    TODAY'S TREATMENT:  08/02/24 Nustep L5x23min LE strength tested  Standing lumbar flexion RTB with PPT x 10 Standing pallof press GTB 2x15 B Standing hip extension RTB 2 x 10 BLE Supine PPT x 10 Supine PPT with march x 10 BLE Supine 90/90 hips/knees PPT 3x10' Supine TrA with PPT hip ABD 2x10 GTB  07/29/24 THERAPEUTIC EXERCISE: To improve strength and endurance.  Demonstration, verbal and tactile cues throughout for technique. Nu Step L5 x 8'  THERAPEUTIC ACTIVITIES: To improve functional performance.  Demonstration, verbal and tactile cues throughout for technique. Pallof press RTB x 2/10 B Standing rowing 10# x 15 BUE Standing straight arm Lat PD 10# x 2/10 BUE  NEUROMUSCULAR RE-EDUCATION: To improve kinesthesia, posture, and proprioception. Supine PPT x 10 Supine PPT w/ bridge x 10 BLE Supine PPT/bridge/march x 2/10 BLE Supine ab curl up fingertips to knees x 2/10 Quadruped cat x 10 Quadruped PPT/hip extension w/ yardstick on back for neuroreeducation x 2/10 BLE Counter plank w/ hip ext x 3/5 BLE   07/26/24 Nustep L5x44min Seated PPT 20x3 Lumbar AROM assessed Seated sidebend stretch with arm reach 2x30' B Standing pallof press blue TB x 10; trunk rotation x 10 each Standing cable rows 15lb x 10 Standing shoulder extension 10lb 2x10  07/22/24 Bike L2x77min Quadruped cat/cow x 10 Quadruped multifidi knee lift from airex x 15 B Supine PPT 20x3 Supine crunches arms straight out x 20 Supine sequential march each LE x 10 Seated PPT 20x3- tactile  cues Seated lumbar flexion AROM  Standing pallof press GTB x 20 each way  07/19/24 Standing hip extension x 10 BLE Supine LTR x 10 both way Supine pelvic tilts 10x3 Supine march + TrA BLE 2x10  Quadruped cat/cow x 10 Seated ab sets with orange pball 15x3 Seated lumbar flexion stretch 2x30 Nustep L5x61min  SELF CARE: Provided education on PT POC progression.    PATIENT EDUCATION:  Education details: continued instruction w/ verbal and tactile cueing for proper performance of PPT w/ ab contraction Person educated: Patient Education method: Explanation, Demonstration, Verbal cues, Tactile cues, and Handouts Education comprehension: verbalized understanding, verbal cues required, tactile cues required, and needs further education  HOME EXERCISE PROGRAM: Access Code: [email or web address] URL: [email or web address] Date: 07/22/2024 Prepared by: Rito Lecomte  Exercises - Supine Lower Trunk Rotation  - 1 x daily - 7 x weekly - 3 sets - 10 reps - Supine Posterior Pelvic Tilt  - 1 x daily - 7 x weekly - 3 sets - 10 reps - Curl Up with Reach  - 1 x daily - 7 x weekly - 3 sets -  10 reps - Supine Bridge  - 1 x daily - 7 x weekly - 3 sets - 10 reps - Sidelying Hip Abduction  - 1 x daily - 7 x weekly - 2 sets - 10 reps - Standing Hip Extension with Counter Support  - 1 x daily - 7 x weekly - 3 sets - 10 reps - Seated Posterior Pelvic Tilt  - 1 x daily - 7 x weekly - 3 sets - 10 reps - Standing Anti-Rotation Press with Anchored Resistance  - 1 x daily - 7 x weekly - 3 sets - 10 reps   ASSESSMENT:  CLINICAL IMPRESSION: LE strength shows improvements, weakness in L hip extension. Worked on core stabilization, all while improving spinal alignment to correct excessive lordosis. Pt with no increased pain during session, continues to require cuing to posteriorly tilt pelvis. PT remains necessary for pain, weakness, ROM deficits.  Continue per poc   Eval: [name] is a 73 y.o.  female who was referred to physical therapy for evaluation and treatment for LBP.   Patient reports onset of LB pain beginning about a year ago after falling from ladder.   However, she has also been picking up her granddaughter a lot since she is keeping her during the daytime hours and this is very painful for her.   Pain is worse with flexion.   Patient has hyperlordotic spine with weak abdominals and hip musculature.    Patient has deficits in lumbar ROM, mild B LE flexibility, BLE strength, abnormal posture, and TTP with abnormal muscle tension  which are interfering with ADLs and are impacting quality of life.  On Modified Oswestry patient scored 13/50 demonstrating 26% moderate disability.  Lyn will benefit from skilled PT to address above deficits to improve mobility and activity tolerance with decreased pain interference.   OBJECTIVE IMPAIRMENTS: Abnormal gait, difficulty walking, decreased ROM, decreased strength, postural dysfunction, and pain.   ACTIVITY LIMITATIONS: carrying, lifting, bending, and caring for others  PARTICIPATION LIMITATIONS: cleaning, laundry, and community activity  PERSONAL FACTORS: Age, Time since onset of injury/illness/exacerbation, and 1-2 comorbidities:   HTN, osteopenia, Cervical DDD, popliteus tendonitisare also affecting patient's functional outcome.   REHAB POTENTIAL: Good  CLINICAL DECISION MAKING: Evolving/moderate complexity  EVALUATION COMPLEXITY: Moderate   GOALS: Goals reviewed with patient? Yes  SHORT TERM GOALS: Target date: 08/10/2024   Patient will be independent with initial HEP to improve outcomes and carryover.  Baseline: 100% PT assist required for correct completion Goal status: MET- 07/19/24  2.  Patient will report 25% improvement in low back pain to improve QOL. Baseline: 6/10 worst 07/29/24:  3/10 worst Goal status: MET   LONG TERM GOALS: Target date: 09/08/2024   Patient will be independent with ongoing/advanced HEP  for self-management at home.  Baseline: no advanced HEP yet 07/29/24:  advanced today Goal status: IN PROGRESS  2.  Patient will report 50-75% improvement in low back pain to improve QOL.  Baseline: 6/10 worst 07/29/24:  3/10 worst Goal status: IN PROGRESS  3.  Patient to demonstrate ability to achieve and maintain good spinal alignment/posturing and body mechanics needed for daily activities. Baseline: hyperlordotic posture 10/9/25She can correct with manual cues Goal status: IN PROGRESS  4.  Patient will demonstrate full pain free lumbar ROM to perform ADLs.   Baseline: Refer to above lumbar ROM table Goal status: IN PROGRESS- 07/26/24  5.  Patient will demonstrate improved BLE strength to >/= 5/5 for improved stability and ease of mobility.  Baseline: Refer to above LE MMT table Goal status: PARTIALLY MET- 08/02/24  6. Patient will report </= 10% on Modified Oswestry (MCID = 12%) to demonstrate improved functional ability with decreased pain interference. Baseline: 26% Goal status: INITIAL  7.  Patient will tolerate 60 min of (standing/sitting/walking) w/o increased pain to allow for  improved mobility and activity tolerance. Baseline: can do it but with increased pain Goal status: INITIAL  PLAN:  PT FREQUENCY: 1-2x/week  PT DURATION: 8 weeks  PLANNED INTERVENTIONS: 97164- PT Re-evaluation, 97750- Physical Performance Testing, 97110-Therapeutic exercises, 97530- Therapeutic activity, 97112- Neuromuscular re-education, 97535- Self Care, 02859- Manual therapy, [phone]- Gait training, [phone]- Aquatic Therapy, [phone]- Electrical stimulation (unattended), 97016- Vasopneumatic device, [id number]- Ultrasound, [id number]- Traction (mechanical), [id number]- Ionotophoresis 4mg /ml Dexamethasone, Patient/Family education, Balance training, Stair training, Taping, Joint mobilization, and Spinal mobilization  PLAN FOR NEXT SESSION: Resubmit next session; Continue with lumbar stabilization, PPT exercises,  core/abdominal strength  Pariss Hommes L Zerina Hallinan, PTA 08/02/2024, 11:01 AM  Date of referral: see referral Referring provider: Jason Leita Repine, FNP Referring diagnosis? Chronic LBP; M54.50 Treatment diagnosis? (if different than referring diagnosis) low back pain, muscle weakness, abnormal posture  What was this (referring dx) caused by? Ongoing Issue  Lysle of Condition: Chronic (continuous duration > 3 months)   Laterality: Both  Current Functional Measure Score: Back Index modified oswestry = 26%  Objective measurements identify impairments when they are compared to normal values, the uninvolved extremity, and prior level of function.  [x]  Yes  []  No  Objective assessment of functional ability: Moderate functional limitations   Briefly describe symptoms: 73 y/o referred to PT for LBP without sciatica.   Patient reports has had LBP about a year since she fell off of a ladder.  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  These machines do not bother her.   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  This bothers her back the most.   She reports the pain is central low back without any radiation or radiculopathy.  States always some dull level of pain, but that it can get sharp at times.   Bending and lifting are the most painful things for her.      How did symptoms start: after falling from ladder 2 yrs ago  Average pain intensity:  Last 24 hours: 6/10 worst  Past week: same  How often does the pt experience symptoms? Constantly  How much have the symptoms interfered with usual daily activities? Quite a bit  How has condition changed since care began at this facility? No change  In general, how is the patients overall health? Good   BACK PAIN (STarT Back Screening Tool) Has pain spread down the leg(s) at some time in the last 2 weeks? no Has  there been pain in the shoulder or neck at some time in the last 2 weeks? no Has the pt only walked short distances because of back pain? yes Has patient dressed more slowly because of back pain in the past 2 weeks? yes Does patient think it's not safe for a person with this condition to be physically active? no Does patient have worrying thoughts a lot of the time? no Does patient feel back pain is terrible and will never get any better? no Has patient stopped enjoying things they usually enjoy? no

## 2024-08-05 ENCOUNTER — Ambulatory Visit

## 2024-08-05 DIAGNOSIS — M5459 Other low back pain: Secondary | ICD-10-CM | POA: Diagnosis not present

## 2024-08-05 DIAGNOSIS — R293 Abnormal posture: Secondary | ICD-10-CM

## 2024-08-05 DIAGNOSIS — M6281 Muscle weakness (generalized): Secondary | ICD-10-CM

## 2024-08-05 DIAGNOSIS — R262 Difficulty in walking, not elsewhere classified: Secondary | ICD-10-CM

## 2024-08-05 NOTE — Therapy (Signed)
 OUTPATIENT PHYSICAL THERAPY THORACOLUMBAR TREATMENT   Patient Name: [name] MRN: [id number] DOB:11/27/1950, 73 y.o., female Today's Date: 08/05/2024  END OF SESSION:  PT End of Session - 08/05/24 1034     Visit Number 7    Date for Recertification  09/07/24    PT Start Time 1019    PT Stop Time 1105    PT Time Calculation (min) 46 min    Activity Tolerance Patient tolerated treatment well;No increased pain    Behavior During Therapy WFL for tasks assessed/performed              Past Medical History:  Diagnosis Date   Cancer (HCC)    Basal cell carcinoma of the nose.   Gastroparesis    Hyperlipidemia    Hypertension    Osteopenia    Past Surgical History:  Procedure Laterality Date   CATARACT EXTRACTION Bilateral    Patient Active Problem List   Diagnosis Date Noted   DDD (degenerative disc disease), cervical 07/25/2023   Popliteus tendinitis of right lower extremity 11/25/2022   Dyslipidemia, goal to be determined 02/10/2017   Essential hypertension 02/10/2017    PCP: Almarie Waddell NOVAK, NP   REFERRING PROVIDER: Jason Leita Repine,*   REFERRING DIAG: [id number] (ICD-10-CM) - Chronic low back pain, unspecified back pain laterality, unspecified whether sciatica present   THERAPY DIAG:  Other low back pain  Muscle weakness (generalized)  Abnormal posture  Difficulty in walking, not elsewhere classified  RATIONALE FOR EVALUATION AND TREATMENT: Rehabilitation  ONSET DATE: 1 yr  NEXT MD VISIT:    SUBJECTIVE:                                                                                                                                                                                                         SUBJECTIVE STATEMENT:  Went shopping yesterday, no pain today.   73 y/o referred to PT for LBP without sciatica.   Patient reports has had LBP about a year since she fell off of a ladder.  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  These machines do not bother her.   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  This bothers her back the most.   She reports the pain is central low back without any radiation or radiculopathy.  States always some dull level of pain, but that it can get sharp at times.  Bending and lifting are the most painful things for her.     PAIN: Are you having pain? Yes: NPRS scale: 0/10 now; 1/10  at night; 6/10 worst over last week Pain location: central Low back Pain description: aching Aggravating factors: bending and lifting  Relieving factors: lying on back  PERTINENT HISTORY:  HTN, osteopenia, Cervical DDD, popliteus tendonitis  PRECAUTIONS: None  RED FLAGS: None  WEIGHT BEARING RESTRICTIONS: No  FALLS:  Has patient fallen in last 6 months? No  LIVING ENVIRONMENT: Lives with: lives alone Lives in: House/apartment Stairs: No Has following equipment at home: None  OCCUPATION: retired from Engineer, manufacturing systems for various healthcare facilities  PLOF: Independent with gait  PATIENT GOALS: relief the pain and get more mobility (in the back)   OBJECTIVE: (objective measures completed at initial evaluation unless otherwise dated)  DIAGNOSTIC FINDINGS:  CLINICAL DATA:  Low back pain 1 year since falling off a ladder. Pain is worse with activity.   EXAM: LUMBAR SPINE - COMPLETE 4+ VIEW   COMPARISON:  CT abdomen pelvis 01/28/2014   FINDINGS: No acute fracture or evidence of traumatic listhesis. Moderate disc space height loss at L5-S1. Moderate facet arthropathy at L4-L5 and L5-S1.   IMPRESSION: Moderate degenerative changes at L4-L5 and L5-S1.     Electronically Signed   By: Norman Gatlin M.D.   On: 06/08/2024 02:45CLINICAL DATA:  Chronic neck pain since 2017, with worsening for 3 months. Weakness in the right arm.   EXAM: MRI  CERVICAL SPINE WITHOUT CONTRAST   TECHNIQUE: Multiplanar, multisequence MR imaging of the cervical spine was performed. No intravenous contrast was administered.   COMPARISON:  11/23/2015   FINDINGS: Alignment: Physiologic.   Vertebrae: No fracture, evidence of discitis, or bone lesion.   Cord: Normal signal and morphology.   Posterior Fossa, vertebral arteries, paraspinal tissues: Negative.   Disc levels:   C2-3: Bulky left degenerative facet spurring. Small central protrusion. No impingement   C3-4: Fairly bulky degenerative facet spurring. Mild disc height loss and bulging with small central protrusion. The canal and foramina are patent   C4-5: Degenerative facet spurring with ankylosis. Mild disc height loss with tiny central protrusion. No neural impingement.   C5-6: Intervertebral and facet ankylosis with underlying ridging. No neural impingement.   C6-7: Degenerative facet spurring which is at least moderate on both sides. Mild disc bulging. Patent canal and foramina   C7-T1:Degenerative facet spurring on both sides, mild to moderate. Mild disc bulging. No neural impingement.   IMPRESSION: Generalized cervical spine degeneration especially affecting facets with facet ankylosis at C4-5 and C5-6. The spinal canal and foramina are diffusely patent. No marrow edema.     Electronically Signed   By: Dorn Roulette M.D.   On: 09/04/2023 07:14    PATIENT SURVEYS:  Modified Oswestry:  MODIFIED OSWESTRY DISABILITY SCALE  Date: 07/13/24 Score  Pain intensity 3 =  Pain medication provides me with moderate relief from pain.  2. Personal care (washing, dressing, etc.) 0 =  I can take care of myself normally without causing increased pain.  3. Lifting 3 = Pain prevents me from lifting heavy weights, but I can manage (5) I have hardly any social life because of my pain. light to medium weights if they are conveniently positioned  4. Walking 3 =  Pain prevents me from  walking more than  mile.  5. Sitting 1 =  I can only sit in my favorite chair as long as I like.  6. Standing  0 =  I can stand as long as I want without increased pain.  7. Sleeping 0 = Pain does not prevent me from sleeping well.  8. Social Life 0 = My social life is normal and does not increase my pain.  9. Traveling 1 =  I can travel anywhere, but it increases my pain.  10. Employment/ Homemaking 2 = I can perform most of my homemaking/job duties, but pain prevents me from performing more physically stressful activities (eg, lifting, vacuuming).  Total 13/50 = 26%   Interpretation of scores: Score Category Description  0-20% Minimal Disability The patient can cope with most living activities. Usually no treatment is indicated apart from advice on lifting, sitting and exercise  21-40% Moderate Disability The patient experiences more pain and difficulty with sitting, lifting and standing. Travel and social life are more difficult and they may be disabled from work. Personal care, sexual activity and sleeping are not grossly affected, and the patient can usually be managed by conservative means  41-60% Severe Disability Pain remains the main problem in this group, but activities of daily living are affected. These patients require a detailed investigation  61-80% Crippled Back pain impinges on all aspects of the patient's life. Positive intervention is required  81-100% Bed-bound  These patients are either bed-bound or exaggerating their symptoms  Bluford FORBES Zoe DELENA Karon DELENA, et al. Surgery versus conservative management of stable thoracolumbar fracture: the PRESTO feasibility RCT. [place] ([place]): [employer]; 2021 Nov. [facility] Technology Assessment, No. 25.62.) Appendix 3, Oswestry Disability Index category descriptors. Available from: FindJewel[employer].cz  Minimally Clinically Important Difference (MCID) = 12.8% odi =   SCREENING FOR RED FLAGS: Bowel or  bladder incontinence: No Spinal tumors: No Cauda equina syndrome: No Compression fracture: No Abdominal aneurysm: No  COGNITION:  Overall cognitive status: Within functional limits for tasks assessed    SENSATION: WFL  POSTURE:  increased lumbar lordosis and anterior pelvic tilt  PALPATION: TTP over B paraspinals, B quadratus, some over the SI joints BLE  LUMBAR ROM:   Active  Eval 07/26/24  Flexion To distal shins; p! Almost to ankles- pain 3/10  Extension 75%; more p! 50% limited  Right lateral flexion To knee To distal thigh  Left lateral flexion To knee To distal thigh  Right rotation 75% 60% limited  Left rotation 75% 60% limited  (Blank rows = not tested)  MUSCLE LENGTH: Hamstrings: Right SLR = 80 deg; Left SLR = 73 deg with some end range back pain Thomas test: Right NT deg; Left gets it just to the table deg Hamstrings: mild tight BLE L>R Piriformis: minimal tightness BLE Hip flexors: mild to mod tightness   LOWER EXTREMITY ROM:     Active  Right eval Left eval  Hip flexion    Hip extension    Hip abduction    Hip adduction    Hip internal rotation    Hip external rotation    Knee flexion    Knee extension    Ankle dorsiflexion    Ankle plantarflexion    Ankle inversion    Ankle eversion    (Blank rows = not tested)  LOWER EXTREMITY MMT:    MMT Right eval Left eval R 08/02/24 L  08/02/24  Hip flexion 4 4 5 5   Hip extension 4- 4- 4+ 4  Hip abduction 4- 4- 5 5  Hip adduction      Hip internal rotation 4+ 4+ 4+ 4+  Hip external rotation 5  5    Knee flexion 5 5    Knee extension 5 5    Ankle dorsiflexion 5 5    Ankle plantarflexion 5 5    Ankle inversion      Ankle eversion       (Blank rows = not tested)  LUMBAR SPECIAL TESTS:  Straight leg raise test: Positive, Slump test: Negative, SI Compression/distraction test: Positive, FABER test: Positive, Gaenslen's test: Positive, Long sit test: Negative, and Thomas test: Negative Pelvic  landmarks and malleoli appear equal in supine.  In standing L shoulder seems a little lower  FUNCTIONAL TESTS:    GAIT: Distance walked: no device Assistive device utilized: None Level of assistance: Complete Independence Gait pattern: has compensated Trendelenburg BLE R >L, short quick steps with increased pelvic rotation bilaterally, accentuated lordosis Comments:    TODAY'S TREATMENT:  08/05/24 NEUROMUSCULAR RE-EDUCATION: To improve coordination, kinesthesia, posture, and proprioception.  Supine hip ADD+ PPT 10x5 Supine sequential march BLE x 10 Supine flutter kicks x 10 BLE SLR with PPT x 10 2lb BLE  THERAPEUTIC ACTIVITIES: to assess functional improvements and check status of goals Checked goals patient- see below under goals  08/02/24 Nustep L5x66min LE strength tested  Standing lumbar flexion RTB with PPT x 10 Standing pallof press GTB 2x15 B Standing hip extension RTB 2 x 10 BLE Supine PPT x 10 Supine PPT with march x 10 BLE Supine 90/90 hips/knees PPT 3x10' Supine TrA with PPT hip ABD 2x10 GTB  07/29/24 THERAPEUTIC EXERCISE: To improve strength and endurance.  Demonstration, verbal and tactile cues throughout for technique. Nu Step L5 x 8'  THERAPEUTIC ACTIVITIES: To improve functional performance.  Demonstration, verbal and tactile cues throughout for technique. Pallof press RTB x 2/10 B Standing rowing 10# x 15 BUE Standing straight arm Lat PD 10# x 2/10 BUE  NEUROMUSCULAR RE-EDUCATION: To improve kinesthesia, posture, and proprioception. Supine PPT x 10 Supine PPT w/ bridge x 10 BLE Supine PPT/bridge/march x 2/10 BLE Supine ab curl up fingertips to knees x 2/10 Quadruped cat x 10 Quadruped PPT/hip extension w/ yardstick on back for neuroreeducation x 2/10 BLE Counter plank w/ hip ext x 3/5 BLE   07/26/24 Nustep L5x80min Seated PPT 20x3 Lumbar AROM assessed Seated sidebend stretch with arm reach 2x30' B Standing pallof press blue TB x 10; trunk  rotation x 10 each Standing cable rows 15lb x 10 Standing shoulder extension 10lb 2x10  07/22/24 Bike L2x4min Quadruped cat/cow x 10 Quadruped multifidi knee lift from airex x 15 B Supine PPT 20x3 Supine crunches arms straight out x 20 Supine sequential march each LE x 10 Seated PPT 20x3- tactile cues Seated lumbar flexion AROM  Standing pallof press GTB x 20 each way  07/19/24 Standing hip extension x 10 BLE Supine LTR x 10 both way Supine pelvic tilts 10x3 Supine march + TrA BLE 2x10  Quadruped cat/cow x 10 Seated ab sets with orange pball 15x3 Seated lumbar flexion stretch 2x30 Nustep L5x18min  SELF CARE: Provided education on PT POC progression.    PATIENT EDUCATION:  Education details: continued instruction w/ verbal and tactile cueing for proper performance of PPT w/ ab contraction Person educated: Patient Education method: Explanation, Demonstration, Verbal cues, Tactile cues, and Handouts Education comprehension: verbalized understanding, verbal cues required, tactile cues required, and needs further education  HOME EXERCISE PROGRAM: Access Code: [email or web address] URL: [email or web address] Date: 08/05/2024 Prepared by: Meekah Math  Exercises - Supine Lower Trunk Rotation  - 1 x daily - 7 x weekly -  3 sets - 10 reps - Supine Posterior Pelvic Tilt  - 1 x daily - 7 x weekly - 3 sets - 10 reps  - Curl Up with Reach  - 1 x daily - 7 x weekly - 3 sets - 10 reps - Marching Bridge  - 1 x daily - 7 x weekly - 3 sets - 10 reps - Sidelying Hip Abduction  - 1 x daily - 7 x weekly - 2 sets - 10 reps - Standing Hip Extension with Counter Support  - 1 x daily - 7 x weekly - 3 sets - 10 reps - Seated Posterior Pelvic Tilt  - 1 x daily - 7 x weekly - 3 sets - 10 reps - Standing Anti-Rotation Press with Anchored Resistance  - 1 x daily - 7 x weekly - 3 sets - 10 reps - Quadruped Cat with Posterior Pelvic Tilt  - 1 x daily - 7 x weekly - 3 sets - 10 reps - Quadruped  on Forearms Hip Extension Alternating  - 1 x daily - 7 x weekly - 3 sets - 10 reps - Active Straight Leg Raise with Quad Set  - 1 x daily - 7 x weekly - 3 sets - 10 reps - Supine 90/90 Alternating Toe Touch  - 1 x daily - 7 x weekly - 3 sets - 10 reps   ASSESSMENT:  CLINICAL IMPRESSION: Functionally patient notes continued difficulty with prolonged standing/walking longer than 30 min w/o pain, sitting longer than 15 min w/o pain. LE strength was assessed last visit showing improved overall strength. She still needs cuing to maintain PPT to avoid excessive lumbar lordosis. She had improved, will recommend continued PT to improve activity tolerance and body mechanics to reduce pain and functional capacity.   Eval: [name] is a 73 y.o. female who was referred to physical therapy for evaluation and treatment for LBP.   Patient reports onset of LB pain beginning about a year ago after falling from ladder.   However, she has also been picking up her granddaughter a lot since she is keeping her during the daytime hours and this is very painful for her.   Pain is worse with flexion.   Patient has hyperlordotic spine with weak abdominals and hip musculature.    Patient has deficits in lumbar ROM, mild B LE flexibility, BLE strength, abnormal posture, and TTP with abnormal muscle tension  which are interfering with ADLs and are impacting quality of life.  On Modified Oswestry patient scored 13/50 demonstrating 26% moderate disability.  [name] will benefit from skilled PT to address above deficits to improve mobility and activity tolerance with decreased pain interference.   OBJECTIVE IMPAIRMENTS: Abnormal gait, difficulty walking, decreased ROM, decreased strength, postural dysfunction, and pain.   ACTIVITY LIMITATIONS: carrying, lifting, bending, and caring for others  PARTICIPATION LIMITATIONS: cleaning, laundry, and community activity  PERSONAL FACTORS: Age, Time since onset of  injury/illness/exacerbation, and 1-2 comorbidities:   HTN, osteopenia, Cervical DDD, popliteus tendonitisare also affecting patient's functional outcome.   REHAB POTENTIAL: Good  CLINICAL DECISION MAKING: Evolving/moderate complexity  EVALUATION COMPLEXITY: Moderate   GOALS: Goals reviewed with patient? Yes  SHORT TERM GOALS: Target date: 08/10/2024   Patient will be independent with initial HEP to improve outcomes and carryover.  Baseline: 100% PT assist required for correct completion Goal status: MET- 07/19/24  2.  Patient will report 25% improvement in low back pain to improve QOL. Baseline: 6/10 worst 07/29/24:  3/10 worst  Goal status: MET   LONG TERM GOALS: Target date: 09/08/2024   Patient will be independent with ongoing/advanced HEP for self-management at home.  Baseline: no advanced HEP yet 07/29/24:  advanced today Goal status: IN PROGRESS  2.  Patient will report 50-75% improvement in low back pain to improve QOL.  Baseline: 6/10 worst 07/29/24:  3/10 worst Goal status: PARTIALLY MET- 08/05/24  3.  Patient to demonstrate ability to achieve and maintain good spinal alignment/posturing and body mechanics needed for daily activities. Baseline: hyperlordotic posture 10/9/25She can correct with manual cues Goal status: IN PROGRESS  4.  Patient will demonstrate full pain free lumbar ROM to perform ADLs.   Baseline: Refer to above lumbar ROM table Goal status: IN PROGRESS- 07/26/24  5.  Patient will demonstrate improved BLE strength to >/= 5/5 for improved stability and ease of mobility. Baseline: Refer to above LE MMT table Goal status: PARTIALLY MET- 08/02/24  6. Patient will report </= 10% on Modified Oswestry (MCID = 12%) to demonstrate improved functional ability with decreased pain interference. Baseline: 26% Goal status: PARTIALLY MET- 10 / 50 = 20.0 % 08/05/24  7.  Patient will tolerate 60 min of (standing/sitting/walking) w/o increased pain to allow for   improved mobility and activity tolerance. Baseline: can do it but with increased pain Goal status: IN PROGRESS- 08/05/24- 30 minutes standing/walking; 15 minutes sitting  PLAN:  PT FREQUENCY: 1-2x/week  PT DURATION: 8 weeks  PLANNED INTERVENTIONS: 02835- PT Re-evaluation, 97750- Physical Performance Testing, 97110-Therapeutic exercises, 97530- Therapeutic activity, 97112- Neuromuscular re-education, 97535- Self Care, 02859- Manual therapy, [id number]- Gait training, [phone]- Aquatic Therapy, [phone]- Electrical stimulation (unattended), 97016- Vasopneumatic device, [id number]- Ultrasound, [id number]- Traction (mechanical), [id number]- Ionotophoresis 4mg /ml Dexamethasone, Patient/Family education, Balance training, Stair training, Taping, Joint mobilization, and Spinal mobilization  PLAN FOR NEXT SESSION: Continue with lumbar stabilization, PPT exercises, core/abdominal strength  [name], PTA 08/05/2024, 11:05 AM  Date of referral: see referral Referring provider: Jason Leita Repine, FNP Referring diagnosis? Chronic LBP; M54.50 Treatment diagnosis? (if different than referring diagnosis) low back pain, muscle weakness, abnormal posture  What was this (referring dx) caused by? Ongoing Issue  Lysle of Condition: Chronic (continuous duration > 3 months)   Laterality: Both  Current Functional Measure Score: Back Index modified oswestry = 10 / 50 = 20.0 %   Objective measurements identify impairments when they are compared to normal values, the uninvolved extremity, and prior level of function.  [x]  Yes  []  No  Objective assessment of functional ability: Moderate functional limitations   Briefly describe symptoms: 73 y/o referred to PT for LBP without sciatica.   Patient reports has had LBP about a year since she fell off of a ladder.  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  These machines do not bother her.   However, she has also been keeping  her granddaughter for about a year now and is having to do a lot of picking her up/down.  This bothers her back the most.   She reports the pain is central low back without any radiation or radiculopathy.  States always some dull level of pain, but that it can get sharp at times.   Bending and lifting are the most painful things for her.      How did symptoms start: after falling from ladder 2 yrs ago  Average pain intensity:  Last 24 hours: 5/10  Past week: 5/10  How often does the pt  experience symptoms? Occasionally  How much have the symptoms interfered with usual daily activities? Moderately  How has condition changed since care began at this facility? Better  In general, how is the patients overall health? Good   BACK PAIN (STarT Back Screening Tool) Has pain spread down the leg(s) at some time in the last 2 weeks? no Has there been pain in the shoulder or neck at some time in the last 2 weeks? yes Has the pt only walked short distances because of back pain? no Has patient dressed more slowly because of back pain in the past 2 weeks? no Does patient think it's not safe for a person with this condition to be physically active? no Does patient have worrying thoughts a lot of the time? no Does patient feel back pain is terrible and will never get any better? no Has patient stopped enjoying things they usually enjoy? no

## 2024-08-09 ENCOUNTER — Ambulatory Visit

## 2024-08-09 DIAGNOSIS — M5459 Other low back pain: Secondary | ICD-10-CM | POA: Diagnosis not present

## 2024-08-09 DIAGNOSIS — R262 Difficulty in walking, not elsewhere classified: Secondary | ICD-10-CM

## 2024-08-09 DIAGNOSIS — R293 Abnormal posture: Secondary | ICD-10-CM

## 2024-08-09 DIAGNOSIS — M6281 Muscle weakness (generalized): Secondary | ICD-10-CM

## 2024-08-09 NOTE — Therapy (Signed)
 OUTPATIENT PHYSICAL THERAPY THORACOLUMBAR TREATMENT   Patient Name: [name] MRN: [id number] DOB:08/04/1951, 73 y.o., female Today's Date: 08/09/2024  END OF SESSION:  PT End of Session - 08/09/24 1022     Visit Number 8    Date for Recertification  09/07/24    PT Start Time 1019    PT Stop Time 1101    PT Time Calculation (min) 42 min    Activity Tolerance Patient tolerated treatment well;No increased pain    Behavior During Therapy WFL for tasks assessed/performed               Past Medical History:  Diagnosis Date   Cancer (HCC)    Basal cell carcinoma of the nose.   Gastroparesis    Hyperlipidemia    Hypertension    Osteopenia    Past Surgical History:  Procedure Laterality Date   CATARACT EXTRACTION Bilateral    Patient Active Problem List   Diagnosis Date Noted   DDD (degenerative disc disease), cervical 07/25/2023   Popliteus tendinitis of right lower extremity 11/25/2022   Dyslipidemia, goal to be determined 02/10/2017   Essential hypertension 02/10/2017    PCP: Almarie Waddell NOVAK, NP   REFERRING PROVIDER: Jason Leita Repine,*   REFERRING DIAG: [phone] (ICD-10-CM) - Chronic low back pain, unspecified back pain laterality, unspecified whether sciatica present   THERAPY DIAG:  Other low back pain  Muscle weakness (generalized)  Abnormal posture  Difficulty in walking, not elsewhere classified  RATIONALE FOR EVALUATION AND TREATMENT: Rehabilitation  ONSET DATE: 1 yr  NEXT MD VISIT:    SUBJECTIVE:                                                                                                                                                                                                         SUBJECTIVE STATEMENT:  Pt reports having a lazy weekend but no pain  73 y/o referred to PT for LBP without sciatica.   Patient reports has had LBP about a year since she fell off of a ladder.  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  These machines do not bother her.   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  This bothers her back the most.   She reports the pain is central low back without any radiation or radiculopathy.  States always some dull level of pain, but that it can get sharp  at times.   Bending and lifting are the most painful things for her.     PAIN: Are you having pain? Yes: NPRS scale: 0/10 now; 1/10  at night; 6/10 worst over last week Pain location: central Low back Pain description: aching Aggravating factors: bending and lifting  Relieving factors: lying on back  PERTINENT HISTORY:  HTN, osteopenia, Cervical DDD, popliteus tendonitis  PRECAUTIONS: None  RED FLAGS: None  WEIGHT BEARING RESTRICTIONS: No  FALLS:  Has patient fallen in last 6 months? No  LIVING ENVIRONMENT: Lives with: lives alone Lives in: House/apartment Stairs: No Has following equipment at home: None  OCCUPATION: retired from Engineer, manufacturing systems for various healthcare facilities  PLOF: Independent with gait  PATIENT GOALS: relief the pain and get more mobility (in the back)   OBJECTIVE: (objective measures completed at initial evaluation unless otherwise dated)  DIAGNOSTIC FINDINGS:  CLINICAL DATA:  Low back pain 1 year since falling off a ladder. Pain is worse with activity.   EXAM: LUMBAR SPINE - COMPLETE 4+ VIEW   COMPARISON:  CT abdomen pelvis 01/28/2014   FINDINGS: No acute fracture or evidence of traumatic listhesis. Moderate disc space height loss at L5-S1. Moderate facet arthropathy at L4-L5 and L5-S1.   IMPRESSION: Moderate degenerative changes at L4-L5 and L5-S1.     Electronically Signed   By: Norman Gatlin M.D.   On: 06/08/2024 02:45CLINICAL DATA:  Chronic neck pain since 2017, with worsening for 3 months. Weakness in the right arm.   EXAM: MRI  CERVICAL SPINE WITHOUT CONTRAST   TECHNIQUE: Multiplanar, multisequence MR imaging of the cervical spine was performed. No intravenous contrast was administered.   COMPARISON:  11/23/2015   FINDINGS: Alignment: Physiologic.   Vertebrae: No fracture, evidence of discitis, or bone lesion.   Cord: Normal signal and morphology.   Posterior Fossa, vertebral arteries, paraspinal tissues: Negative.   Disc levels:   C2-3: Bulky left degenerative facet spurring. Small central protrusion. No impingement   C3-4: Fairly bulky degenerative facet spurring. Mild disc height loss and bulging with small central protrusion. The canal and foramina are patent   C4-5: Degenerative facet spurring with ankylosis. Mild disc height loss with tiny central protrusion. No neural impingement.   C5-6: Intervertebral and facet ankylosis with underlying ridging. No neural impingement.   C6-7: Degenerative facet spurring which is at least moderate on both sides. Mild disc bulging. Patent canal and foramina   C7-T1:Degenerative facet spurring on both sides, mild to moderate. Mild disc bulging. No neural impingement.   IMPRESSION: Generalized cervical spine degeneration especially affecting facets with facet ankylosis at C4-5 and C5-6. The spinal canal and foramina are diffusely patent. No marrow edema.     Electronically Signed   By: Dorn Roulette M.D.   On: 09/04/2023 07:14    PATIENT SURVEYS:  Modified Oswestry:  MODIFIED OSWESTRY DISABILITY SCALE  Date: 07/13/24 Score  Pain intensity 3 =  Pain medication provides me with moderate relief from pain.  2. Personal care (washing, dressing, etc.) 0 =  I can take care of myself normally without causing increased pain.  3. Lifting 3 = Pain prevents me from lifting heavy weights, but I can manage (5) I have hardly any social life because of my pain. light to medium weights if they are conveniently positioned  4. Walking 3 =  Pain prevents me from  walking more than  mile.  5. Sitting 1 =  I can only sit in my favorite chair as long as I  like.  6. Standing 0 =  I can stand as long as I want without increased pain.  7. Sleeping 0 = Pain does not prevent me from sleeping well.  8. Social Life 0 = My social life is normal and does not increase my pain.  9. Traveling 1 =  I can travel anywhere, but it increases my pain.  10. Employment/ Homemaking 2 = I can perform most of my homemaking/job duties, but pain prevents me from performing more physically stressful activities (eg, lifting, vacuuming).  Total 13/50 = 26%   Interpretation of scores: Score Category Description  0-20% Minimal Disability The patient can cope with most living activities. Usually no treatment is indicated apart from advice on lifting, sitting and exercise  21-40% Moderate Disability The patient experiences more pain and difficulty with sitting, lifting and standing. Travel and social life are more difficult and they may be disabled from work. Personal care, sexual activity and sleeping are not grossly affected, and the patient can usually be managed by conservative means  41-60% Severe Disability Pain remains the main problem in this group, but activities of daily living are affected. These patients require a detailed investigation  61-80% Crippled Back pain impinges on all aspects of the patient's life. Positive intervention is required  81-100% Bed-bound  These patients are either bed-bound or exaggerating their symptoms  Bluford FORBES Zoe DELENA Karon DELENA, et al. Surgery versus conservative management of stable thoracolumbar fracture: the PRESTO feasibility RCT. [place] ([place]): [employer]; 2021 Nov. [facility] Technology Assessment, No. 25.62.) Appendix 3, Oswestry Disability Index category descriptors. Available from: FindJewelers.cz  Minimally Clinically Important Difference (MCID) = 12.8% odi =   SCREENING FOR RED FLAGS: Bowel or  bladder incontinence: No Spinal tumors: No Cauda equina syndrome: No Compression fracture: No Abdominal aneurysm: No  COGNITION:  Overall cognitive status: Within functional limits for tasks assessed    SENSATION: WFL  POSTURE:  increased lumbar lordosis and anterior pelvic tilt  PALPATION: TTP over B paraspinals, B quadratus, some over the SI joints BLE  LUMBAR ROM:   Active  Eval 07/26/24  Flexion To distal shins; p! Almost to ankles- pain 3/10  Extension 75%; more p! 50% limited  Right lateral flexion To knee To distal thigh  Left lateral flexion To knee To distal thigh  Right rotation 75% 60% limited  Left rotation 75% 60% limited  (Blank rows = not tested)  MUSCLE LENGTH: Hamstrings: Right SLR = 80 deg; Left SLR = 73 deg with some end range back pain Thomas test: Right NT deg; Left gets it just to the table deg Hamstrings: mild tight BLE L>R Piriformis: minimal tightness BLE Hip flexors: mild to mod tightness   LOWER EXTREMITY ROM:     Active  Right eval Left eval  Hip flexion    Hip extension    Hip abduction    Hip adduction    Hip internal rotation    Hip external rotation    Knee flexion    Knee extension    Ankle dorsiflexion    Ankle plantarflexion    Ankle inversion    Ankle eversion    (Blank rows = not tested)  LOWER EXTREMITY MMT:    MMT Right eval Left eval R 08/02/24 L  08/02/24  Hip flexion 4 4 5 5   Hip extension 4- 4- 4+ 4  Hip abduction 4- 4- 5 5  Hip adduction      Hip internal rotation 4+ 4+ 4+ 4+  Hip external rotation 5 5    Knee flexion 5 5    Knee extension 5 5    Ankle dorsiflexion 5 5    Ankle plantarflexion 5 5    Ankle inversion      Ankle eversion       (Blank rows = not tested)  LUMBAR SPECIAL TESTS:  Straight leg raise test: Positive, Slump test: Negative, SI Compression/distraction test: Positive, FABER test: Positive, Gaenslen's test: Positive, Long sit test: Negative, and Thomas test: Negative Pelvic  landmarks and malleoli appear equal in supine.  In standing L shoulder seems a little lower  FUNCTIONAL TESTS:    GAIT: Distance walked: no device Assistive device utilized: None Level of assistance: Complete Independence Gait pattern: has compensated Trendelenburg BLE R >L, short quick steps with increased pelvic rotation bilaterally, accentuated lordosis Comments:    TODAY'S TREATMENT:  08/09/24 NEUROMUSCULAR RE-EDUCATION: To improve coordination, kinesthesia, posture, and proprioception. Standing pallof press GTB x 20 B Standing trunk rotation GTB x 10 B Standing marching x 20 3lb B Standing hip flexion x 20 3lb B Standing rows 10lb 2x10 Standing shoulder extension 10lb 2x10  THERAPEUTIC EXERCISE: To improve strength, endurance, ROM, and flexibility.  Nustep L5x62min UE/LE Seated lumbar flexion stretch 2x 1 min hold each Table piriformis stretch x 1 min B  THERAPEUTIC ACTIVITIES: To improve functional performance.  MANUAL THERAPY: To promote normalized muscle tension, improve joint mobility and/or for pain modulation  08/05/24 NEUROMUSCULAR RE-EDUCATION: To improve coordination, kinesthesia, posture, and proprioception.  Supine hip ADD+ PPT 10x5 Supine sequential march BLE x 10 Supine flutter kicks x 10 BLE SLR with PPT x 10 2lb BLE  THERAPEUTIC ACTIVITIES: to assess functional improvements and check status of goals Checked goals patient- see below under goals  08/02/24 Nustep L5x79min LE strength tested  Standing lumbar flexion RTB with PPT x 10 Standing pallof press GTB 2x15 B Standing hip extension RTB 2 x 10 BLE Supine PPT x 10 Supine PPT with march x 10 BLE Supine 90/90 hips/knees PPT 3x10' Supine TrA with PPT hip ABD 2x10 GTB  07/29/24 THERAPEUTIC EXERCISE: To improve strength and endurance.  Demonstration, verbal and tactile cues throughout for technique. Nu Step L5 x 8'  THERAPEUTIC ACTIVITIES: To improve functional performance.  Demonstration, verbal  and tactile cues throughout for technique. Pallof press RTB x 2/10 B Standing rowing 10# x 15 BUE Standing straight arm Lat PD 10# x 2/10 BUE  NEUROMUSCULAR RE-EDUCATION: To improve kinesthesia, posture, and proprioception. Supine PPT x 10 Supine PPT w/ bridge x 10 BLE Supine PPT/bridge/march x 2/10 BLE Supine ab curl up fingertips to knees x 2/10 Quadruped cat x 10 Quadruped PPT/hip extension w/ yardstick on back for neuroreeducation x 2/10 BLE Counter plank w/ hip ext x 3/5 BLE   07/26/24 Nustep L5x20min Seated PPT 20x3 Lumbar AROM assessed Seated sidebend stretch with arm reach 2x30' B Standing pallof press blue TB x 10; trunk rotation x 10 each Standing cable rows 15lb x 10 Standing shoulder extension 10lb 2x10  07/22/24 Bike L2x17min Quadruped cat/cow x 10 Quadruped multifidi knee lift from airex x 15 B Supine PPT 20x3 Supine crunches arms straight out x 20 Supine sequential march each LE x 10 Seated PPT 20x3- tactile cues Seated lumbar flexion AROM  Standing pallof press GTB x 20 each way  07/19/24 Standing hip extension x 10 BLE Supine LTR x 10 both way Supine pelvic tilts 10x3 Supine march + TrA BLE 2x10  Quadruped cat/cow x 10 Seated  ab sets with orange pball 15x3 Seated lumbar flexion stretch 2x30 Nustep L5x13min  SELF CARE: Provided education on PT POC progression.    PATIENT EDUCATION:  Education details: continued instruction w/ verbal and tactile cueing for proper performance of PPT w/ ab contraction Person educated: Patient Education method: Explanation, Demonstration, Verbal cues, Tactile cues, and Handouts Education comprehension: verbalized understanding, verbal cues required, tactile cues required, and needs further education  HOME EXERCISE PROGRAM: Access Code: [email or web address] URL: [email or web address] Date: 08/05/2024 Prepared by: Julian Askin  Exercises - Supine Lower Trunk Rotation  - 1 x daily - 7 x weekly - 3 sets - 10  reps - Supine Posterior Pelvic Tilt  - 1 x daily - 7 x weekly - 3 sets - 10 reps  - Curl Up with Reach  - 1 x daily - 7 x weekly - 3 sets - 10 reps - Marching Bridge  - 1 x daily - 7 x weekly - 3 sets - 10 reps - Sidelying Hip Abduction  - 1 x daily - 7 x weekly - 2 sets - 10 reps - Standing Hip Extension with Counter Support  - 1 x daily - 7 x weekly - 3 sets - 10 reps - Seated Posterior Pelvic Tilt  - 1 x daily - 7 x weekly - 3 sets - 10 reps - Standing Anti-Rotation Press with Anchored Resistance  - 1 x daily - 7 x weekly - 3 sets - 10 reps - Quadruped Cat with Posterior Pelvic Tilt  - 1 x daily - 7 x weekly - 3 sets - 10 reps - Quadruped on Forearms Hip Extension Alternating  - 1 x daily - 7 x weekly - 3 sets - 10 reps - Active Straight Leg Raise with Quad Set  - 1 x daily - 7 x weekly - 3 sets - 10 reps - Supine 90/90 Alternating Toe Touch  - 1 x daily - 7 x weekly - 3 sets - 10 reps   ASSESSMENT:  CLINICAL IMPRESSION: Advanced lumbar stabilization, postural awareness and LE strengthening. Cuing throughout session as needed for form.    Eval: [name] is a 73 y.o. female who was referred to physical therapy for evaluation and treatment for LBP.   Patient reports onset of LB pain beginning about a year ago after falling from ladder.   However, she has also been picking up her granddaughter a lot since she is keeping her during the daytime hours and this is very painful for her.   Pain is worse with flexion.   Patient has hyperlordotic spine with weak abdominals and hip musculature.    Patient has deficits in lumbar ROM, mild B LE flexibility, BLE strength, abnormal posture, and TTP with abnormal muscle tension  which are interfering with ADLs and are impacting quality of life.  On Modified Oswestry patient scored 13/50 demonstrating 26% moderate disability.  Erielle will benefit from skilled PT to address above deficits to improve mobility and activity tolerance with decreased pain  interference.   OBJECTIVE IMPAIRMENTS: Abnormal gait, difficulty walking, decreased ROM, decreased strength, postural dysfunction, and pain.   ACTIVITY LIMITATIONS: carrying, lifting, bending, and caring for others  PARTICIPATION LIMITATIONS: cleaning, laundry, and community activity  PERSONAL FACTORS: Age, Time since onset of injury/illness/exacerbation, and 1-2 comorbidities:   HTN, osteopenia, Cervical DDD, popliteus tendonitisare also affecting patient's functional outcome.   REHAB POTENTIAL: Good  CLINICAL DECISION MAKING: Evolving/moderate complexity  EVALUATION COMPLEXITY: Moderate   GOALS: Goals reviewed with patient?  Yes  SHORT TERM GOALS: Target date: 08/10/2024   Patient will be independent with initial HEP to improve outcomes and carryover.  Baseline: 100% PT assist required for correct completion Goal status: MET- 07/19/24  2.  Patient will report 25% improvement in low back pain to improve QOL. Baseline: 6/10 worst 07/29/24:  3/10 worst Goal status: MET   LONG TERM GOALS: Target date: 09/08/2024   Patient will be independent with ongoing/advanced HEP for self-management at home.  Baseline: no advanced HEP yet 07/29/24:  advanced today Goal status: IN PROGRESS  2.  Patient will report 50-75% improvement in low back pain to improve QOL.  Baseline: 6/10 worst 07/29/24:  3/10 worst Goal status: PARTIALLY MET- 08/05/24  3.  Patient to demonstrate ability to achieve and maintain good spinal alignment/posturing and body mechanics needed for daily activities. Baseline: hyperlordotic posture 10/9/25She can correct with manual cues Goal status: IN PROGRESS  4.  Patient will demonstrate full pain free lumbar ROM to perform ADLs.   Baseline: Refer to above lumbar ROM table Goal status: IN PROGRESS- 07/26/24  5.  Patient will demonstrate improved BLE strength to >/= 5/5 for improved stability and ease of mobility. Baseline: Refer to above LE MMT table Goal status:  PARTIALLY MET- 08/02/24  6. Patient will report </= 10% on Modified Oswestry (MCID = 12%) to demonstrate improved functional ability with decreased pain interference. Baseline: 26% Goal status: PARTIALLY MET- 10 / 50 = 20.0 % 08/05/24  7.  Patient will tolerate 60 min of (standing/sitting/walking) w/o increased pain to allow for  improved mobility and activity tolerance. Baseline: can do it but with increased pain Goal status: IN PROGRESS- 08/05/24- 30 minutes standing/walking; 15 minutes sitting  PLAN:  PT FREQUENCY: 1-2x/week  PT DURATION: 8 weeks  PLANNED INTERVENTIONS: 02835- PT Re-evaluation, 97750- Physical Performance Testing, 97110-Therapeutic exercises, 97530- Therapeutic activity, 97112- Neuromuscular re-education, 97535- Self Care, 02859- Manual therapy, [phone]- Gait training, [phone]- Aquatic Therapy, [phone]- Electrical stimulation (unattended), 97016- Vasopneumatic device, [id number]- Ultrasound, [id number]- Traction (mechanical), [id number]- Ionotophoresis 4mg /ml Dexamethasone, Patient/Family education, Balance training, Stair training, Taping, Joint mobilization, and Spinal mobilization  PLAN FOR NEXT SESSION: Continue with lumbar stabilization, PPT exercises, can maybe add in functional activities  Sol LITTIE Gaskins, PTA 08/09/2024, 11:01 AM  Date of referral: see referral Referring provider: Jason Leita Repine, FNP Referring diagnosis? Chronic LBP; M54.50 Treatment diagnosis? (if different than referring diagnosis) low back pain, muscle weakness, abnormal posture  What was this (referring dx) caused by? Ongoing Issue  Lysle of Condition: Chronic (continuous duration > 3 months)   Laterality: Both  Current Functional Measure Score: Back Index modified oswestry = 10 / 50 = 20.0 %   Objective measurements identify impairments when they are compared to normal values, the uninvolved extremity, and prior level of function.  [x]  Yes  []  No  Objective assessment of functional  ability: Moderate functional limitations   Briefly describe symptoms: 73 y/o referred to PT for LBP without sciatica.   Patient reports has had LBP about a year since she fell off of a ladder.  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  These machines do not bother her.   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  This bothers her back the most.   She reports the pain is central low back without any radiation or radiculopathy.  States  always some dull level of pain, but that it can get sharp at times.   Bending and lifting are the most painful things for her.      How did symptoms start: after falling from ladder 2 yrs ago  Average pain intensity:  Last 24 hours: 5/10  Past week: 5/10  How often does the pt experience symptoms? Occasionally  How much have the symptoms interfered with usual daily activities? Moderately  How has condition changed since care began at this facility? Better  In general, how is the patients overall health? Good   BACK PAIN (STarT Back Screening Tool) Has pain spread down the leg(s) at some time in the last 2 weeks? no Has there been pain in the shoulder or neck at some time in the last 2 weeks? yes Has the pt only walked short distances because of back pain? no Has patient dressed more slowly because of back pain in the past 2 weeks? no Does patient think it's not safe for a person with this condition to be physically active? no Does patient have worrying thoughts a lot of the time? no Does patient feel back pain is terrible and will never get any better? no Has patient stopped enjoying things they usually enjoy? no

## 2024-08-12 ENCOUNTER — Encounter: Payer: Self-pay | Admitting: Rehabilitation

## 2024-08-12 ENCOUNTER — Ambulatory Visit: Admitting: Rehabilitation

## 2024-08-12 DIAGNOSIS — M5459 Other low back pain: Secondary | ICD-10-CM | POA: Diagnosis not present

## 2024-08-12 DIAGNOSIS — R293 Abnormal posture: Secondary | ICD-10-CM

## 2024-08-12 DIAGNOSIS — R262 Difficulty in walking, not elsewhere classified: Secondary | ICD-10-CM

## 2024-08-12 DIAGNOSIS — M6281 Muscle weakness (generalized): Secondary | ICD-10-CM

## 2024-08-12 DIAGNOSIS — M542 Cervicalgia: Secondary | ICD-10-CM

## 2024-08-12 NOTE — Therapy (Signed)
 OUTPATIENT PHYSICAL THERAPY THORACOLUMBAR TREATMENT   Progress Note Reporting Period 07/13/24 to 08/12/2024  See note below for Objective Data and Assessment of Progress/Goals.    Patient Name: [name] MRN: [id number] DOB:October 26, 1950, 73 y.o., female Today's Date: 08/12/2024  END OF SESSION:  PT End of Session - 08/12/24 1136     Visit Number 9    Date for Recertification  09/07/24    PT Start Time 1015    PT Stop Time 1100    PT Time Calculation (min) 45 min    Activity Tolerance Patient tolerated treatment well;No increased pain    Behavior During Therapy WFL for tasks assessed/performed                Past Medical History:  Diagnosis Date   Cancer (HCC)    Basal cell carcinoma of the nose.   Gastroparesis    Hyperlipidemia    Hypertension    Osteopenia    Past Surgical History:  Procedure Laterality Date   CATARACT EXTRACTION Bilateral    Patient Active Problem List   Diagnosis Date Noted   DDD (degenerative disc disease), cervical 07/25/2023   Popliteus tendinitis of right lower extremity 11/25/2022   Dyslipidemia, goal to be determined 02/10/2017   Essential hypertension 02/10/2017    PCP: Almarie Waddell NOVAK, NP   REFERRING PROVIDER: Jason Leita Repine,*   REFERRING DIAG: [phone] (ICD-10-CM) - Chronic low back pain, unspecified back pain laterality, unspecified whether sciatica present   THERAPY DIAG:  Other low back pain  Muscle weakness (generalized)  Abnormal posture  Difficulty in walking, not elsewhere classified  Cervicalgia  RATIONALE FOR EVALUATION AND TREATMENT: Rehabilitation  ONSET DATE: 1 yr  NEXT MD VISIT:    SUBJECTIVE:                                                                                                                                                                                                         SUBJECTIVE STATEMENT:  Patient reports 0/10 pain today.  Worst over last 7 days was  5/10 when she was in quadruped working on cleaning her bathroom floor.  However, she is pleased with her progress to date and feels she is about 80% improved since initial visit  73 y/o referred to PT for LBP without sciatica.   Patient reports has had LBP about a year since she fell off of a ladder.  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  These machines do not bother her.   However,  she has also been keeping her granddaughter for about a year now and is having to do a lot of picking her up/down.  This bothers her back the most.   She reports the pain is central low back without any radiation or radiculopathy.  States always some dull level of pain, but that it can get sharp at times.   Bending and lifting are the most painful things for her.     PAIN: Are you having pain? Yes: NPRS scale: 0/10 now; 1/10  at night; 6/10 worst over last week Pain location: central Low back Pain description: aching Aggravating factors: bending and lifting  Relieving factors: lying on back  PERTINENT HISTORY:  HTN, osteopenia, Cervical DDD, popliteus tendonitis  PRECAUTIONS: None  RED FLAGS: None  WEIGHT BEARING RESTRICTIONS: No  FALLS:  Has patient fallen in last 6 months? No  LIVING ENVIRONMENT: Lives with: lives alone Lives in: House/apartment Stairs: No Has following equipment at home: None  OCCUPATION: retired from Engineer, manufacturing systems for various healthcare facilities  PLOF: Independent with gait  PATIENT GOALS: relief the pain and get more mobility (in the back)   OBJECTIVE: (objective measures completed at initial evaluation unless otherwise dated)  DIAGNOSTIC FINDINGS:  CLINICAL DATA:  Low back pain 1 year since falling off a ladder. Pain is worse with activity.   EXAM: LUMBAR SPINE - COMPLETE 4+ VIEW   COMPARISON:  CT abdomen pelvis 01/28/2014   FINDINGS: No acute fracture or evidence of traumatic listhesis. Moderate  disc space height loss at L5-S1. Moderate facet arthropathy at L4-L5 and L5-S1.   IMPRESSION: Moderate degenerative changes at L4-L5 and L5-S1.     Electronically Signed   By: Norman Gatlin M.D.   On: 06/08/2024 02:45CLINICAL DATA:  Chronic neck pain since 2017, with worsening for 3 months. Weakness in the right arm.   EXAM: MRI CERVICAL SPINE WITHOUT CONTRAST   TECHNIQUE: Multiplanar, multisequence MR imaging of the cervical spine was performed. No intravenous contrast was administered.   COMPARISON:  11/23/2015   FINDINGS: Alignment: Physiologic.   Vertebrae: No fracture, evidence of discitis, or bone lesion.   Cord: Normal signal and morphology.   Posterior Fossa, vertebral arteries, paraspinal tissues: Negative.   Disc levels:   C2-3: Bulky left degenerative facet spurring. Small central protrusion. No impingement   C3-4: Fairly bulky degenerative facet spurring. Mild disc height loss and bulging with small central protrusion. The canal and foramina are patent   C4-5: Degenerative facet spurring with ankylosis. Mild disc height loss with tiny central protrusion. No neural impingement.   C5-6: Intervertebral and facet ankylosis with underlying ridging. No neural impingement.   C6-7: Degenerative facet spurring which is at least moderate on both sides. Mild disc bulging. Patent canal and foramina   C7-T1:Degenerative facet spurring on both sides, mild to moderate. Mild disc bulging. No neural impingement.   IMPRESSION: Generalized cervical spine degeneration especially affecting facets with facet ankylosis at C4-5 and C5-6. The spinal canal and foramina are diffusely patent. No marrow edema.     Electronically Signed   By: Dorn Roulette M.D.   On: 09/04/2023 07:14    PATIENT SURVEYS:  Modified Oswestry:  MODIFIED OSWESTRY DISABILITY SCALE  Date: 07/13/24 Score  Pain intensity 3 =  Pain medication provides me with moderate relief from pain.  2.  Personal care (washing, dressing, etc.) 0 =  I can take care of myself normally without causing increased pain.  3. Lifting 3 = Pain prevents me from lifting  heavy weights, but I can manage (5) I have hardly any social life because of my pain. light to medium weights if they are conveniently positioned  4. Walking 3 =  Pain prevents me from walking more than  mile.  5. Sitting 1 =  I can only sit in my favorite chair as long as I like.  6. Standing 0 =  I can stand as long as I want without increased pain.  7. Sleeping 0 = Pain does not prevent me from sleeping well.  8. Social Life 0 = My social life is normal and does not increase my pain.  9. Traveling 1 =  I can travel anywhere, but it increases my pain.  10. Employment/ Homemaking 2 = I can perform most of my homemaking/job duties, but pain prevents me from performing more physically stressful activities (eg, lifting, vacuuming).  Total 13/50 = 26%   Interpretation of scores: Score Category Description  0-20% Minimal Disability The patient can cope with most living activities. Usually no treatment is indicated apart from advice on lifting, sitting and exercise  21-40% Moderate Disability The patient experiences more pain and difficulty with sitting, lifting and standing. Travel and social life are more difficult and they may be disabled from work. Personal care, sexual activity and sleeping are not grossly affected, and the patient can usually be managed by conservative means  41-60% Severe Disability Pain remains the main problem in this group, but activities of daily living are affected. These patients require a detailed investigation  61-80% Crippled Back pain impinges on all aspects of the patient's life. Positive intervention is required  81-100% Bed-bound  These patients are either bed-bound or exaggerating their symptoms  Bluford FORBES Zoe DELENA Karon DELENA, et al. Surgery versus conservative management of stable thoracolumbar fracture: the  PRESTO feasibility RCT. [place] ([place]): [employer]; 2021 Nov. [facility] Technology Assessment, No. 25.62.) Appendix 3, Oswestry Disability Index category descriptors. Available from: Find[employer]el[employer].cz  Minimally Clinically Important Difference (MCID) = 12.8% odi =   SCREENING FOR RED FLAGS: Bowel or bladder incontinence: No Spinal tumors: No Cauda equina syndrome: No Compression fracture: No Abdominal aneurysm: No  COGNITION:  Overall cognitive status: Within functional limits for tasks assessed    SENSATION: WFL  POSTURE:  increased lumbar lordosis and anterior pelvic tilt  PALPATION: TTP over B paraspinals, B quadratus, some over the SI joints BLE  LUMBAR ROM:   Active  Eval 07/26/24  Flexion To distal shins; p! Almost to ankles- pain 3/10  Extension 75%; more p! 50% limited  Right lateral flexion To knee To distal thigh  Left lateral flexion To knee To distal thigh  Right rotation 75% 60% limited  Left rotation 75% 60% limited  (Blank rows = not tested)  MUSCLE LENGTH: Hamstrings: Right SLR = 80 deg; Left SLR = 73 deg with some end range back pain Thomas test: Right NT deg; Left gets it just to the table deg Hamstrings: mild tight BLE L>R Piriformis: minimal tightness BLE Hip flexors: mild to mod tightness   LOWER EXTREMITY ROM:     Active  Right eval Left eval  Hip flexion    Hip extension    Hip abduction    Hip adduction    Hip internal rotation    Hip external rotation    Knee flexion    Knee extension    Ankle dorsiflexion    Ankle plantarflexion    Ankle inversion    Ankle eversion    (  Blank rows = not tested)  LOWER EXTREMITY MMT:    MMT Right eval Left eval R 08/02/24 L  08/02/24  Hip flexion 4 4 5 5   Hip extension 4- 4- 4+ 4  Hip abduction 4- 4- 5 5  Hip adduction      Hip internal rotation 4+ 4+ 4+ 4+  Hip external rotation 5 5    Knee flexion 5 5    Knee extension 5 5    Ankle dorsiflexion 5 5     Ankle plantarflexion 5 5    Ankle inversion      Ankle eversion       (Blank rows = not tested)  LUMBAR SPECIAL TESTS:  Straight leg raise test: Positive, Slump test: Negative, SI Compression/distraction test: Positive, FABER test: Positive, Gaenslen's test: Positive, Long sit test: Negative, and Thomas test: Negative Pelvic landmarks and malleoli appear equal in supine.  In standing L shoulder seems a little lower  FUNCTIONAL TESTS:    GAIT: Distance walked: no device Assistive device utilized: None Level of assistance: Complete Independence Gait pattern: has compensated Trendelenburg BLE R >L, short quick steps with increased pelvic rotation bilaterally, accentuated lordosis Comments:    TODAY'S TREATMENT:  08/12/24 THERAPEUTIC EXERCISE: To improve strength, endurance, ROM, and flexibility.  Nustep L5x70min UE/LE  NEUROMUSCULAR RE-EDUCATION: To improve balance, posture, and proprioception. MFI walkouts GTB x 5 laps each way Palloff press GTB x 10 BUE Standing hip ext w/ PPT RTB x 2/10 BLE Standing hip abd w/ PPT RTB x 2/10 BLE Swiss ball lg:  Bouncing EO x 1'; EC x 1'  LAQ x 20 BLE  Hip flexion x 20 BLE  Alternate arm/leg raises x 20 BUE/BLE Quadruped hip extension x 2/10 BLE   08/09/24 NEUROMUSCULAR RE-EDUCATION: To improve coordination, kinesthesia, posture, and proprioception. Standing pallof press GTB x 20 B Standing trunk rotation GTB x 10 B Standing marching x 20 3lb B Standing hip flexion x 20 3lb B Standing rows 10lb 2x10 Standing shoulder extension 10lb 2x10  THERAPEUTIC EXERCISE: To improve strength, endurance, ROM, and flexibility.  Nustep L5x8min UE/LE Seated lumbar flexion stretch 2x 1 min hold each Table piriformis stretch x 1 min B  THERAPEUTIC ACTIVITIES: To improve functional performance.  MANUAL THERAPY: To promote normalized muscle tension, improve joint mobility and/or for pain modulation  08/05/24 NEUROMUSCULAR RE-EDUCATION: To improve  coordination, kinesthesia, posture, and proprioception.  Supine hip ADD+ PPT 10x5 Supine sequential march BLE x 10 Supine flutter kicks x 10 BLE SLR with PPT x 10 2lb BLE  THERAPEUTIC ACTIVITIES: to assess functional improvements and check status of goals Checked goals patient- see below under goals  08/02/24 Nustep L5x49min LE strength tested  Standing lumbar flexion RTB with PPT x 10 Standing pallof press GTB 2x15 B Standing hip extension RTB 2 x 10 BLE Supine PPT x 10 Supine PPT with march x 10 BLE Supine 90/90 hips/knees PPT 3x10' Supine TrA with PPT hip ABD 2x10 GTB   PATIENT EDUCATION:  Education details: Postural correction with PPT Person educated: Patient Education method: Explanation, Demonstration, Verbal cues, Tactile cues, and Handouts Education comprehension: verbalized understanding, verbal cues required, tactile cues required, and needs further education  HOME EXERCISE PROGRAM: Access Code: [email or web address] URL: [email or web address] Date: 08/12/2024 Prepared by: Garnette Montclair  Exercises - Supine Lower Trunk Rotation  - 1 x daily - 7 x weekly - 3 sets - 10 reps - Supine Posterior Pelvic Tilt  - 1 x daily - 7 x weekly - 3  sets - 10 reps - Curl Up with Reach  - 1 x daily - 7 x weekly - 3 sets - 10 reps - Marching Bridge  - 1 x daily - 7 x weekly - 3 sets - 10 reps - Sidelying Hip Abduction  - 1 x daily - 7 x weekly - 2 sets - 10 reps - Seated Posterior Pelvic Tilt  - 1 x daily - 7 x weekly - 3 sets - 10 reps - Quadruped Cat with Posterior Pelvic Tilt  - 1 x daily - 7 x weekly - 3 sets - 10 reps - Quadruped on Forearms Hip Extension Alternating  - 1 x daily - 7 x weekly - 3 sets - 10 reps - Active Straight Leg Raise with Quad Set  - 1 x daily - 7 x weekly - 3 sets - 10 reps - Supine 90/90 Alternating Toe Touch  - 1 x daily - 7 x weekly - 3 sets - 10 reps - Swiss Ball March  - 1 x daily - 7 x weekly - 2 sets - 10 reps - Swiss Ball Knee Extension  -  1 x daily - 7 x weekly - 2 sets - 10 reps - Seated March with Opposite Arm Flexion on Swiss Ball  - 1 x daily - 7 x weekly - 2 sets - 10 reps - Seated Flexion Stretch with Swiss Ball  - 1 x daily - 7 x weekly - 2 sets - 10 reps - Standing Anti-Rotation Press with Anchored Resistance  - 1 x daily - 7 x weekly - 3 sets - 10 reps  ASSESSMENT:  CLINICAL IMPRESSION: Patient has been seen x 10 PT visits for LBP and is making excellent progress.   Pain is decreasing, strength and stability is increasing as evidenced by MMT scores in tables above.  Outcome scores on ODI have improved by 6%.  She still has residual back pain and reports working in quadruped cleaning her floors recently increased her back pain.  We are addressing this core stabilization issue with posterior pelvic posture correctional exercises with LE movements.  She still has difficulty maintaining PPT, but it is improving.   PT remains necessary for strength, stability, HEP, pain deficits.   Continue per POC   Eval: [name] is a 73 y.o. female who was referred to physical therapy for evaluation and treatment for LBP.   Patient reports onset of LB pain beginning about a year ago after falling from ladder.   However, she has also been picking up her granddaughter a lot since she is keeping her during the daytime hours and this is very painful for her.   Pain is worse with flexion.   Patient has hyperlordotic spine with weak abdominals and hip musculature.    Patient has deficits in lumbar ROM, mild B LE flexibility, BLE strength, abnormal posture, and TTP with abnormal muscle tension  which are interfering with ADLs and are impacting quality of life.  On Modified Oswestry patient scored 13/50 demonstrating 26% moderate disability.  Christeen will benefit from skilled PT to address above deficits to improve mobility and activity tolerance with decreased pain interference.   OBJECTIVE IMPAIRMENTS: Abnormal gait, difficulty walking,  decreased ROM, decreased strength, postural dysfunction, and pain.   ACTIVITY LIMITATIONS: carrying, lifting, bending, and caring for others  PARTICIPATION LIMITATIONS: cleaning, laundry, and community activity  PERSONAL FACTORS: Age, Time since onset of injury/illness/exacerbation, and 1-2 comorbidities:   HTN, osteopenia, Cervical DDD, popliteus tendonitisare  also affecting patient's functional outcome.   REHAB POTENTIAL: Good  CLINICAL DECISION MAKING: Evolving/moderate complexity  EVALUATION COMPLEXITY: Moderate   GOALS: Goals reviewed with patient? Yes  SHORT TERM GOALS: Target date: 08/10/2024   Patient will be independent with initial HEP to improve outcomes and carryover.  Baseline: 100% PT assist required for correct completion Goal status: MET- 07/19/24  2.  Patient will report 25% improvement in low back pain to improve QOL. Baseline: 6/10 worst 07/29/24:  3/10 worst Goal status: MET   LONG TERM GOALS: Target date: 09/08/2024   Patient will be independent with ongoing/advanced HEP for self-management at home.  Baseline: no advanced HEP yet 07/29/24:  advanced today Goal status: IN PROGRESS  2.  Patient will report 50-75% improvement in low back pain to improve QOL.  Baseline: 6/10 worst 07/29/24:  3/10 worst Goal status: PARTIALLY MET- 08/05/24  3.  Patient to demonstrate ability to achieve and maintain good spinal alignment/posturing and body mechanics needed for daily activities. Baseline: hyperlordotic posture 10/9/25She can correct with manual cues Goal status: IN PROGRESS  4.  Patient will demonstrate full pain free lumbar ROM to perform ADLs.   Baseline: Refer to above lumbar ROM table Goal status: IN PROGRESS- 07/26/24  5.  Patient will demonstrate improved BLE strength to >/= 5/5 for improved stability and ease of mobility. Baseline: Refer to above LE MMT table Goal status: PARTIALLY MET- 08/02/24  6. Patient will report </= 10% on Modified  Oswestry (MCID = 12%) to demonstrate improved functional ability with decreased pain interference. Baseline: 26% Goal status: PARTIALLY MET- 10 / 50 = 20.0 % 08/05/24  7.  Patient will tolerate 60 min of (standing/sitting/walking) w/o increased pain to allow for  improved mobility and activity tolerance. Baseline: can do it but with increased pain Goal status: IN PROGRESS- 08/05/24- 30 minutes standing/walking; 15 minutes sitting  PLAN:  PT FREQUENCY: 1-2x/week  PT DURATION: 8 weeks  PLANNED INTERVENTIONS: 02835- PT Re-evaluation, 97750- Physical Performance Testing, 97110-Therapeutic exercises, 97530- Therapeutic activity, 97112- Neuromuscular re-education, 97535- Self Care, 02859- Manual therapy, [phone]- Gait training, [phone]- Aquatic Therapy, [phone]- Electrical stimulation (unattended), 97016- Vasopneumatic device, [id number]- Ultrasound, [id number]- Traction (mechanical), [id number]- Ionotophoresis 4mg /ml Dexamethasone, Patient/Family education, Balance training, Stair training, Taping, Joint mobilization, and Spinal mobilization  PLAN FOR NEXT SESSION:  add in functional activities;  lumbar stabilization w/ PPT  Jaima Janney, PT 08/12/2024, 5:20 PM  Date of referral: see referral Referring provider: Jason Leita Repine, FNP Referring diagnosis? Chronic LBP; M54.50 Treatment diagnosis? (if different than referring diagnosis) low back pain, muscle weakness, abnormal posture  What was this (referring dx) caused by? Ongoing Issue  Lysle of Condition: Chronic (continuous duration > 3 months)   Laterality: Both  Current Functional Measure Score: Back Index modified oswestry = 10 / 50 = 20.0 %   Objective measurements identify impairments when they are compared to normal values, the uninvolved extremity, and prior level of function.  [x]  Yes  []  No  Objective assessment of functional ability: Moderate functional limitations   Briefly describe symptoms: 73 y/o referred to PT for LBP  without sciatica.   Patient reports has had LBP about a year since she fell off of a ladder.  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  These machines do not bother her.   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  This bothers her back the most.   She reports the pain is central low back without any radiation or radiculopathy.  States always some dull level of pain, but that it can get sharp at times.   Bending and lifting are the most painful things for her.      How did symptoms start: after falling from ladder 2 yrs ago  Average pain intensity:  Last 24 hours: 5/10  Past week: 5/10  How often does the pt experience symptoms? Occasionally  How much have the symptoms interfered with usual daily activities? Moderately  How has condition changed since care began at this facility? Better  In general, how is the patients overall health? Good   BACK PAIN (STarT Back Screening Tool) Has pain spread down the leg(s) at some time in the last 2 weeks? no Has there been pain in the shoulder or neck at some time in the last 2 weeks? yes Has the pt only walked short distances because of back pain? no Has patient dressed more slowly because of back pain in the past 2 weeks? no Does patient think it's not safe for a person with this condition to be physically active? no Does patient have worrying thoughts a lot of the time? no Does patient feel back pain is terrible and will never get any better? no Has patient stopped enjoying things they usually enjoy? no

## 2024-08-16 ENCOUNTER — Ambulatory Visit: Admitting: Rehabilitation

## 2024-08-16 DIAGNOSIS — R293 Abnormal posture: Secondary | ICD-10-CM

## 2024-08-16 DIAGNOSIS — M6281 Muscle weakness (generalized): Secondary | ICD-10-CM

## 2024-08-16 DIAGNOSIS — M5459 Other low back pain: Secondary | ICD-10-CM | POA: Diagnosis not present

## 2024-08-16 NOTE — Therapy (Signed)
 OUTPATIENT PHYSICAL THERAPY THORACOLUMBAR TREATMENT / DC SUMMARY      Patient Name: [name] MRN: [id number] DOB:1951/07/24, 73 y.o., female Today's Date: 08/17/2024  END OF SESSION:           Past Medical History:  Diagnosis Date   Cancer (HCC)    Basal cell carcinoma of the nose.   Gastroparesis    Hyperlipidemia    Hypertension    Osteopenia    Past Surgical History:  Procedure Laterality Date   CATARACT EXTRACTION Bilateral    Patient Active Problem List   Diagnosis Date Noted   DDD (degenerative disc disease), cervical 07/25/2023   Popliteus tendinitis of right lower extremity 11/25/2022   Dyslipidemia, goal to be determined 02/10/2017   Essential hypertension 02/10/2017    PCP: Almarie Waddell NOVAK, NP   REFERRING PROVIDER: Jason Leita Repine,*   REFERRING DIAG: [id number] (ICD-10-CM) - Chronic low back pain, unspecified back pain laterality, unspecified whether sciatica present   THERAPY DIAG:  Other low back pain  Muscle weakness (generalized)  Abnormal posture  RATIONALE FOR EVALUATION AND TREATMENT: Rehabilitation  ONSET DATE: 1 yr  NEXT MD VISIT:    SUBJECTIVE:                                                                                                                                                                                                         SUBJECTIVE STATEMENT:  Patient denies any LBP at this time.   States feels she has accomplished what she hoped with PT and is ready for D/C today  73 y/o referred to PT for LBP without sciatica.   Patient reports has had LBP about a year since she fell off of a ladder.  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  These machines do not bother her.   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  This bothers her back the most.   She reports the pain is  central low back without any radiation or radiculopathy.  States always some dull level of pain, but that it can get sharp at times.   Bending and lifting are the most painful things for her.     PAIN: Are you having pain? Yes: NPRS scale: 0/10 now; 1/10  at night; 6/10 worst over last week Pain location: central Low back Pain description: aching Aggravating factors: bending and lifting  Relieving factors: lying on back  PERTINENT HISTORY:  HTN, osteopenia, Cervical DDD, popliteus tendonitis  PRECAUTIONS: None  RED FLAGS: None  WEIGHT BEARING RESTRICTIONS: No  FALLS:  Has patient fallen in last 6 months? No  LIVING ENVIRONMENT: Lives with: lives alone Lives in: House/apartment Stairs: No Has following equipment at home: None  OCCUPATION: retired from engineer, manufacturing systems for various healthcare facilities  PLOF: Independent with gait  PATIENT GOALS: relief the pain and get more mobility (in the back)   OBJECTIVE: (objective measures completed at initial evaluation unless otherwise dated)  DIAGNOSTIC FINDINGS:  CLINICAL DATA:  Low back pain 1 year since falling off a ladder. Pain is worse with activity.   EXAM: LUMBAR SPINE - COMPLETE 4+ VIEW   COMPARISON:  CT abdomen pelvis 01/28/2014   FINDINGS: No acute fracture or evidence of traumatic listhesis. Moderate disc space height loss at L5-S1. Moderate facet arthropathy at L4-L5 and L5-S1.   IMPRESSION: Moderate degenerative changes at L4-L5 and L5-S1.     Electronically Signed   By: Norman Gatlin M.D.   On: 06/08/2024 02:45CLINICAL DATA:  Chronic neck pain since 2017, with worsening for 3 months. Weakness in the right arm.   EXAM: MRI CERVICAL SPINE WITHOUT CONTRAST   TECHNIQUE: Multiplanar, multisequence MR imaging of the cervical spine was performed. No intravenous contrast was administered.   COMPARISON:  11/23/2015   FINDINGS: Alignment: Physiologic.   Vertebrae: No fracture, evidence of  discitis, or bone lesion.   Cord: Normal signal and morphology.   Posterior Fossa, vertebral arteries, paraspinal tissues: Negative.   Disc levels:   C2-3: Bulky left degenerative facet spurring. Small central protrusion. No impingement   C3-4: Fairly bulky degenerative facet spurring. Mild disc height loss and bulging with small central protrusion. The canal and foramina are patent   C4-5: Degenerative facet spurring with ankylosis. Mild disc height loss with tiny central protrusion. No neural impingement.   C5-6: Intervertebral and facet ankylosis with underlying ridging. No neural impingement.   C6-7: Degenerative facet spurring which is at least moderate on both sides. Mild disc bulging. Patent canal and foramina   C7-T1:Degenerative facet spurring on both sides, mild to moderate. Mild disc bulging. No neural impingement.   IMPRESSION: Generalized cervical spine degeneration especially affecting facets with facet ankylosis at C4-5 and C5-6. The spinal canal and foramina are diffusely patent. No marrow edema.     Electronically Signed   By: Dorn Roulette M.D.   On: 09/04/2023 07:14    PATIENT SURVEYS:  Modified Oswestry:  MODIFIED OSWESTRY DISABILITY SCALE   Date: 07/13/24 Score 08/16/24  Pain intensity 3 =  Pain medication provides me with moderate relief from pain. 0  2. Personal care (washing, dressing, etc.) 0 =  I can take care of myself normally without causing increased pain. 0  3. Lifting 3 = Pain prevents me from lifting heavy weights, but I can manage (5) I have hardly any social life because of my pain. light to medium weights if they are conveniently positioned 3  4. Walking 3 =  Pain prevents me from walking more than  mile. 1  5. Sitting 1 =  I can only sit in my favorite chair as long as I like. 1  6. Standing 0 =  I can stand as long as I want without increased pain. 1  7. Sleeping 0 = Pain does not prevent me from sleeping well. 0  8. Social Life  0 = My social life is normal and does not increase my pain.  0  9. Traveling 1 =  I can travel anywhere, but it increases my pain. 1  10. Employment/ Homemaking 2 = I can perform most of my homemaking/job duties, but pain prevents me from performing more physically stressful activities (eg, lifting, vacuuming). 0  Total 13/50 = 26% 7/50   Interpretation of scores: Score Category Description  0-20% Minimal Disability The patient can cope with most living activities. Usually no treatment is indicated apart from advice on lifting, sitting and exercise  21-40% Moderate Disability The patient experiences more pain and difficulty with sitting, lifting and standing. Travel and social life are more difficult and they may be disabled from work. Personal care, sexual activity and sleeping are not grossly affected, and the patient can usually be managed by conservative means  41-60% Severe Disability Pain remains the main problem in this group, but activities of daily living are affected. These patients require a detailed investigation  61-80% Crippled Back pain impinges on all aspects of the patient's life. Positive intervention is required  81-100% Bed-bound  These patients are either bed-bound or exaggerating their symptoms  Bluford FORBES Zoe DELENA Karon DELENA, et al. Surgery versus conservative management of stable thoracolumbar fracture: the PRESTO feasibility RCT. [place] ([place]): [employer]; 2021 Nov. [facility] Technology Assessment, No. 25.62.) Appendix 3, Oswestry Disability Index category descriptors. Available from: [email or web address].cz  Minimally Clinically Important Difference (MCID) = 12.8% odi =   SCREENING FOR RED FLAGS: Bowel or bladder incontinence: No Spinal tumors: No Cauda equina syndrome: No Compression fracture: No Abdominal aneurysm: No  COGNITION:  Overall cognitive status: Within functional limits for tasks assessed    SENSATION: WFL  POSTURE:   increased lumbar lordosis and anterior pelvic tilt  PALPATION: TTP over B paraspinals, B quadratus, some over the SI joints BLE  LUMBAR ROM:   Active  Eval 07/26/24  Flexion To distal shins; p! Almost to ankles- pain 3/10  Extension 75%; more p! 50% limited  Right lateral flexion To knee To distal thigh  Left lateral flexion To knee To distal thigh  Right rotation 75% 60% limited  Left rotation 75% 60% limited  (Blank rows = not tested)  MUSCLE LENGTH: Hamstrings: Right SLR = 80 deg; Left SLR = 73 deg with some end range back pain Thomas test: Right NT deg; Left gets it just to the table deg Hamstrings: mild tight BLE L>R Piriformis: minimal tightness BLE Hip flexors: mild to mod tightness   LOWER EXTREMITY ROM:     Active  Right eval Left eval  Hip flexion    Hip extension    Hip abduction    Hip adduction    Hip internal rotation    Hip external rotation    Knee flexion    Knee extension    Ankle dorsiflexion    Ankle plantarflexion    Ankle inversion    Ankle eversion    (Blank rows = not tested)  LOWER EXTREMITY MMT:    MMT Right eval Left eval R 08/02/24 L  08/02/24  Hip flexion 4 4 5 5   Hip extension 4- 4- 4+ 4  Hip abduction 4- 4- 5 5  Hip adduction      Hip internal rotation 4+ 4+ 4+ 4+  Hip external rotation 5 5    Knee flexion 5 5    Knee extension 5 5    Ankle dorsiflexion 5 5    Ankle plantarflexion 5 5    Ankle inversion      Ankle  eversion       (Blank rows = not tested)  LUMBAR SPECIAL TESTS:  Straight leg raise test: Positive, Slump test: Negative, SI Compression/distraction test: Positive, FABER test: Positive, Gaenslen's test: Positive, Long sit test: Negative, and Thomas test: Negative Pelvic landmarks and malleoli appear equal in supine.  In standing L shoulder seems a little lower  FUNCTIONAL TESTS:    GAIT: Distance walked: no device Assistive device utilized: None Level of assistance: Complete Independence Gait pattern: has  compensated Trendelenburg BLE R >L, short quick steps with increased pelvic rotation bilaterally, accentuated lordosis Comments:    TODAY'S TREATMENT:  08/16/24 THERAPEUTIC EXERCISE: To improve strength and endurance.  Demonstration, verbal and tactile cues throughout for technique. Bike L4 x 6'  NEUROMUSCULAR RE-EDUCATION: To improve coordination, kinesthesia, posture, and proprioception. Supine curl up x 20  Supine diagonal curl up x 10 R; x 10 L Supine PPT w/ BP cuff w/ sustained pressure x 1';   w/ marching x 20 BLE;  w/ knee extension x 20 BLE;  w/ peanut ball rollouts x 20 Standing Lat PD shoulder extension w/ PPT x 2/10 BUE Quadruped hip ext w/ PPT x 20 BLE Quadruped arm marching w/ PPT x 20 BUE Quadruped shoulder flexion w/ PPT x 20 BUE Quadruped alternate arm/leg raises w/ PPT x 20 BUE Box lifts from floor to mat table w/ 8lb box x 5 w/ PPT Box carry holding box out from body x 1 lap around gym w/ PPT  PATIENT EDUCATION:  Education details: Postural correction with PPT Person educated: Patient Education method: Explanation, Demonstration, Verbal cues, Tactile cues, and Handouts Education comprehension: verbalized understanding, verbal cues required, tactile cues required, and needs further education  HOME EXERCISE PROGRAM: Access Code: [email or web address] URL: [email or web address] Date: 08/12/2024 Prepared by: Garnette Montclair  Exercises - Supine Lower Trunk Rotation  - 1 x daily - 7 x weekly - 3 sets - 10 reps - Supine Posterior Pelvic Tilt  - 1 x daily - 7 x weekly - 3 sets - 10 reps - Curl Up with Reach  - 1 x daily - 7 x weekly - 3 sets - 10 reps - Marching Bridge  - 1 x daily - 7 x weekly - 3 sets - 10 reps - Sidelying Hip Abduction  - 1 x daily - 7 x weekly - 2 sets - 10 reps - Seated Posterior Pelvic Tilt  - 1 x daily - 7 x weekly - 3 sets - 10 reps - Quadruped Cat with Posterior Pelvic Tilt  - 1 x daily - 7 x weekly - 3 sets - 10 reps - Quadruped on  Forearms Hip Extension Alternating  - 1 x daily - 7 x weekly - 3 sets - 10 reps - Active Straight Leg Raise with Quad Set  - 1 x daily - 7 x weekly - 3 sets - 10 reps - Supine 90/90 Alternating Toe Touch  - 1 x daily - 7 x weekly - 3 sets - 10 reps - Swiss Ball March  - 1 x daily - 7 x weekly - 2 sets - 10 reps - Swiss Ball Knee Extension  - 1 x daily - 7 x weekly - 2 sets - 10 reps - Seated March with Opposite Arm Flexion on Swiss Ball  - 1 x daily - 7 x weekly - 2 sets - 10 reps - Seated Flexion Stretch with Swiss Ball  - 1 x daily - 7 x weekly - 2 sets - 10  reps - Standing Anti-Rotation Press with Anchored Resistance  - 1 x daily - 7 x weekly - 3 sets - 10 reps  ASSESSMENT:  CLINICAL IMPRESSION: Patient has been seen x 1 month PT for LBP and is ready for D/C per her report.   She feels she has accomplished what she had hoped with PT and would like to continue with her home exercises on her own.   She is going to the gym regularly and is mostly painfree.   She feels 80% improved.   She is going to D/C PT today and call us  with any further questions.  ( lumbar ROM and BLE strength was not rechecked since we didn't realize she wanted to D/C til end of the visit).  She has improved by 12% on the oswestry disability index outcome score indicating good improvement.   Main problem she still has is with lifting anything heavy causing some pain.   She is encouraged to continue with her home exerciess and call us  with any further questions.   She verbalizes understanding and agreement.   D/C PT.  Eval: LASANDRA BATLEY is a 73 y.o. female who was referred to physical therapy for evaluation and treatment for LBP.   Patient reports onset of LB pain beginning about a year ago after falling from ladder.   However, she has also been picking up her granddaughter a lot since she is keeping her during the daytime hours and this is very painful for her.   Pain is worse with flexion.   Patient has hyperlordotic spine  with weak abdominals and hip musculature.    Patient has deficits in lumbar ROM, mild B LE flexibility, BLE strength, abnormal posture, and TTP with abnormal muscle tension  which are interfering with ADLs and are impacting quality of life.  On Modified Oswestry patient scored 13/50 demonstrating 26% moderate disability.  Fatma will benefit from skilled PT to address above deficits to improve mobility and activity tolerance with decreased pain interference.   OBJECTIVE IMPAIRMENTS: Abnormal gait, difficulty walking, decreased ROM, decreased strength, postural dysfunction, and pain.   ACTIVITY LIMITATIONS: carrying, lifting, bending, and caring for others  PARTICIPATION LIMITATIONS: cleaning, laundry, and community activity  PERSONAL FACTORS: Age, Time since onset of injury/illness/exacerbation, and 1-2 comorbidities:   HTN, osteopenia, Cervical DDD, popliteus tendonitisare also affecting patient's functional outcome.   REHAB POTENTIAL: Good  CLINICAL DECISION MAKING: Evolving/moderate complexity  EVALUATION COMPLEXITY: Moderate   GOALS: Goals reviewed with patient? Yes  SHORT TERM GOALS: Target date: 08/10/2024   Patient will be independent with initial HEP to improve outcomes and carryover.  Baseline: 100% PT assist required for correct completion Goal status: MET- 07/19/24  2.  Patient will report 25% improvement in low back pain to improve QOL. Baseline: 6/10 worst 07/29/24:  3/10 worst Goal status: MET   LONG TERM GOALS: Target date: 09/08/2024   Patient will be independent with ongoing/advanced HEP for self-management at home.  Baseline: no advanced HEP yet 07/29/24:  advanced today Goal status: IN PROGRESS  2.  Patient will report 50-75% improvement in low back pain to improve QOL.  Baseline: 6/10 worst 07/29/24:  3/10 worst Goal status: MET- 08/16/24  3.  Patient to demonstrate ability to achieve and maintain good spinal alignment/posturing and body mechanics needed  for daily activities. Baseline: hyperlordotic posture 10/9/25She can correct with manual cues Goal status: IN PROGRESS  4.  Patient will demonstrate full pain free lumbar ROM to perform ADLs.   Baseline:  Refer to above lumbar ROM table Goal status: IN PROGRESS- 07/26/24  5.  Patient will demonstrate improved BLE strength to >/= 5/5 for improved stability and ease of mobility. Baseline: Refer to above LE MMT table Goal status: PARTIALLY MET- 08/02/24  6. Patient will report </= 10% on Modified Oswestry (MCID = 12%) to demonstrate improved functional ability with decreased pain interference. Baseline: 26% Goal status: PARTIALLY MET- 08/16/24 = 14%  7.  Patient will tolerate 60 min of (standing/sitting/walking) w/o increased pain to allow for  improved mobility and activity tolerance. Baseline: can do it but with increased pain Goal status: PARTIALLY MET- 08/05/24- 30 minutes standing/walking; 15 minutes sitting  PLAN:  PT FREQUENCY: 1-2x/week  PT DURATION: 8 weeks  PLANNED INTERVENTIONS: 97164- PT Re-evaluation, 97750- Physical Performance Testing, 97110-Therapeutic exercises, 97530- Therapeutic activity, [id number]- Neuromuscular re-education, 97535- Self Care, [id number]- Manual therapy, [id number]- Gait training, [phone]- Aquatic Therapy, [phone]- Electrical stimulation (unattended), 97016- Vasopneumatic device, [id number]- Ultrasound, [id number]- Traction (mechanical), [id number]- Ionotophoresis 4mg /ml Dexamethasone, Patient/Family education, Balance training, Stair training, Taping, Joint mobilization, and Spinal mobilization  PLAN FOR NEXT SESSION:  D/C PT per patient request  PHYSICAL THERAPY DISCHARGE SUMMARY  Visits from Start of Care: 10   Current functional level related to goals / functional outcomes: See goals section of chart   Remaining deficits: Only very occasional pain in the central low back;  Still some difficulty maintaining neutral spine with functional activities   Education /  Equipment: Patient is independent with all home exercises and advised to continue daily as tolerated and call us  with any questions   Patient agrees to discharge. Patient goals were partially met. Patient is being discharged due to the patient's request.   Theodoros Stjames, PT 08/17/2024, 9:34 PM

## 2024-08-23 ENCOUNTER — Encounter (HOSPITAL_BASED_OUTPATIENT_CLINIC_OR_DEPARTMENT_OTHER): Payer: Self-pay

## 2024-08-23 ENCOUNTER — Ambulatory Visit (HOSPITAL_BASED_OUTPATIENT_CLINIC_OR_DEPARTMENT_OTHER)
Admission: RE | Admit: 2024-08-23 | Discharge: 2024-08-23 | Disposition: A | Discharge status: Home / Self Care | Source: Ambulatory Visit | Attending: Family Medicine | Admitting: Family Medicine

## 2024-08-23 DIAGNOSIS — Z1231 Encounter for screening mammogram for malignant neoplasm of breast: Secondary | ICD-10-CM | POA: Insufficient documentation

## 2024-09-23 DIAGNOSIS — Z85828 Personal history of other malignant neoplasm of skin: Secondary | ICD-10-CM | POA: Insufficient documentation

## 2024-09-24 ENCOUNTER — Encounter: Payer: Self-pay | Admitting: Family Medicine

## 2024-09-24 ENCOUNTER — Ambulatory Visit: Admitting: Family Medicine

## 2024-09-24 VITALS — BP 138/64 | HR 94 | Ht 61.5 in | Wt 164.0 lb

## 2024-09-24 DIAGNOSIS — I10 Essential (primary) hypertension: Secondary | ICD-10-CM

## 2024-09-24 DIAGNOSIS — E785 Hyperlipidemia, unspecified: Secondary | ICD-10-CM

## 2024-09-24 LAB — LIPID PANEL
Cholesterol: 226 mg/dL — ABNORMAL HIGH (ref 0–200)
HDL: 48 mg/dL (ref >39.00)
LDL Cholesterol: 132 mg/dL — ABNORMAL HIGH (ref 0–99)
NonHDL: 177.87
Total CHOL/HDL Ratio: 5
Triglycerides: 230 mg/dL — ABNORMAL HIGH (ref 0.0–149.0)
VLDL: 46 mg/dL — ABNORMAL HIGH (ref 0.0–40.0)

## 2024-09-24 LAB — CBC WITH DIFFERENTIAL/PLATELET
Basophils Absolute: 0 K/uL (ref 0.0–0.1)
Basophils Relative: 0.4 % (ref 0.0–3.0)
Eosinophils Absolute: 0.2 K/uL (ref 0.0–0.7)
Eosinophils Relative: 3.3 % (ref 0.0–5.0)
HCT: 38.8 % (ref 36.0–46.0)
Hemoglobin: 13 g/dL (ref 12.0–15.0)
Lymphocytes Relative: 47.1 % — ABNORMAL HIGH (ref 12.0–46.0)
Lymphs Abs: 3 K/uL (ref 0.7–4.0)
MCHC: 33.6 g/dL (ref 30.0–36.0)
MCV: 87.8 fl (ref 78.0–100.0)
Monocytes Absolute: 0.5 K/uL (ref 0.1–1.0)
Monocytes Relative: 7.5 % (ref 3.0–12.0)
Neutro Abs: 2.7 K/uL (ref 1.4–7.7)
Neutrophils Relative %: 41.7 % — ABNORMAL LOW (ref 43.0–77.0)
Platelets: 216 K/uL (ref 150.0–400.0)
RBC: 4.42 Mil/uL (ref 3.87–5.11)
RDW: 13.9 % (ref 11.5–15.5)
WBC: 6.5 K/uL (ref 4.0–10.5)

## 2024-09-24 LAB — COMPREHENSIVE METABOLIC PANEL WITH GFR
ALT: 15 U/L (ref 0–35)
AST: 20 U/L (ref 0–37)
Albumin: 4.5 g/dL (ref 3.5–5.2)
Alkaline Phosphatase: 71 U/L (ref 39–117)
BUN: 13 mg/dL (ref 6–23)
CO2: 31 meq/L (ref 19–32)
Calcium: 9.7 mg/dL (ref 8.4–10.5)
Chloride: 102 meq/L (ref 96–112)
Creatinine, Ser: 0.96 mg/dL (ref 0.40–1.20)
GFR: 58.39 mL/min — ABNORMAL LOW (ref >60.00)
Glucose, Bld: 89 mg/dL (ref 70–99)
Potassium: 4.3 meq/L (ref 3.5–5.1)
Sodium: 141 meq/L (ref 135–145)
Total Bilirubin: 0.5 mg/dL (ref 0.2–1.2)
Total Protein: 6.8 g/dL (ref 6.0–8.3)

## 2024-09-24 LAB — TSH: TSH: 2.6 u[IU]/mL (ref 0.35–5.50)

## 2024-09-24 MED ORDER — LISINOPRIL-HYDROCHLOROTHIAZIDE 10-12.5 MG PO TABS: 1.0000 | ORAL_TABLET | Freq: Every day | ORAL | 1 refills | Status: AC

## 2024-09-24 MED ORDER — ROSUVASTATIN CALCIUM 5 MG PO TABS: 5.0000 mg | ORAL_TABLET | Freq: Every day | ORAL | 1 refills | Status: DC

## 2024-09-24 NOTE — Assessment & Plan Note (Signed)
 Blood pressure is at goal for age and co-morbidities.   Recommendations: continue lisinopril -hctz 10-12.5 mg daily - BP goal <130/80 - monitor and log blood pressures at home - check around the same time each day in a relaxed setting - Limit salt to <2000 mg/day - Follow DASH eating plan (heart healthy diet) - limit alcohol to 2 standard drinks per day for men and 1 per day for women - avoid tobacco products - get at least 2 hours of regular aerobic exercise weekly Patient aware of signs/symptoms requiring further/urgent evaluation. Labs ordered

## 2024-09-24 NOTE — Assessment & Plan Note (Signed)
-  Medication management: continue Crestor  5 mg daily -Diet low in saturated fat -Regular exercise - at least 30 minutes, 5 times per week -Labs today

## 2024-09-24 NOTE — Progress Notes (Signed)
 Established Patient Office Visit  Subjective:  Patient ID: [name], female    DOB: June 06, 1951  Age: 73 y.o. MRN: [id number]  CC:  Chief Complaint  Patient presents with   Hypertension      HPI [name] is here for blood pressure follow-up.     Hypertension: - Medications: Lisinopril -Hydrochlorothiazide  10-12.5 mg daily.  - Compliance: good - Checking BP at home: yes, 110-140 SBP - Denies any SOB, recurrent headaches, CP, vision changes, LE edema, palpitations, or medication side effects. Occasional dizziness if changing positions too quickly or overworking.  - Diet: low sodium  - Exercise: minimal    Hyperlipidemia: - medications: Rosuvastatin  5 mg daily.  - compliance: good - medication SEs: none The 10-year ASCVD risk score (Arnett DK, et al., 2019) is: 15.6%   Values used to calculate the score:     Age: 73 years     Clincally relevant sex: Female     Is Non-Hispanic African American: Yes     Diabetic: No     Tobacco smoker: No     Systolic Blood Pressure: 138 mmHg     Is BP treated: Yes     HDL Cholesterol: 47.2 mg/dL     Total Cholesterol: 200 mg/dL    Past Medical History:  Diagnosis Date   Allergy 1960   Arthritis    Cancer (HCC)    Basal cell carcinoma of the nose.   Cataract    Gastroparesis    GERD (gastroesophageal reflux disease)    Hyperlipidemia    Hypertension    Osteopenia     Past Surgical History:  Procedure Laterality Date   ABDOMINAL HYSTERECTOMY  1993   CATARACT EXTRACTION Bilateral    CHOLECYSTECTOMY     EYE SURGERY      Family History  Problem Relation Age of Onset   Hypertension Mother    Arthritis Mother    COPD Father     Social History   Socioeconomic History   Marital status: Divorced    Spouse name: Not on file   Number of children: Not on file   Years of education: Not on file   Highest education level: Some college, no degree  Occupational History   Not on file  Tobacco Use    Smoking status: Never   Smokeless tobacco: Never  Substance and Sexual Activity   Alcohol use: Not Currently   Drug use: Never   Sexual activity: Not on file  Other Topics Concern   Not on file  Social History Narrative   Not on file   Social Drivers of Health   Financial Resource Strain: Medium Risk (09/21/2024)   Overall Financial Resource Strain (CARDIA)    Difficulty of Paying Living Expenses: Somewhat hard  Food Insecurity: No Food Insecurity (09/21/2024)   Hunger Vital Sign    Worried About Running Out of Food in the Last Year: Never true    Ran Out of Food in the Last Year: Never true  Transportation Needs: No Transportation Needs (09/21/2024)   PRAPARE - Administrator, Civil Service (Medical): No    Lack of Transportation (Non-Medical): No  Physical Activity: Insufficiently Active (09/21/2024)   Exercise Vital Sign    Days of Exercise per Week: 3 days    Minutes of Exercise per Session: 30 min  Stress: Stress Concern Present (09/21/2024)   [employer] of Occupational Health - Occupational Stress Questionnaire    Feeling of Stress: To some extent  Social Connections: Moderately Integrated (09/21/2024)   Social Connection and Isolation Panel    Frequency of Communication with Friends and Family: More than three times a week    Frequency of Social Gatherings with Friends and Family: Three times a week    Attends Religious Services: More than 4 times per year    Active Member of Clubs or Organizations: Yes    Attends Banker Meetings: More than 4 times per year    Marital Status: Divorced  Intimate Partner Violence: Not At Risk (01/29/2024)   Humiliation, Afraid, Rape, and Kick questionnaire    Fear of Current or Ex-Partner: No    Emotionally Abused: No    Physically Abused: No    Sexually Abused: No    ROS All ROS negative except what is listed in the HPI.   Objective:   Today's Vitals: BP 138/64   Pulse 94   Ht 5' 1.5 (1.562 m)    Wt 164 lb (74.4 kg)   SpO2 100%   BMI 30.49 kg/m   Physical Exam Vitals reviewed.  Constitutional:      Appearance: Normal appearance. She is obese.  Cardiovascular:     Rate and Rhythm: Normal rate and regular rhythm.     Heart sounds: Normal heart sounds.  Pulmonary:     Effort: Pulmonary effort is normal.     Breath sounds: Normal breath sounds.  Musculoskeletal:     Right lower leg: No edema.     Left lower leg: No edema.  Skin:    General: Skin is warm and dry.  Neurological:     Mental Status: She is alert and oriented to person, place, and time.  Psychiatric:        Mood and Affect: Mood normal.        Behavior: Behavior normal.        Thought Content: Thought content normal.        Judgment: Judgment normal.        Assessment & Plan:   Problem List Items Addressed This Visit       Active Problems   Dyslipidemia, goal to be determined (Chronic)   -Medication management: continue Crestor  5 mg daily -Diet low in saturated fat -Regular exercise - at least 30 minutes, 5 times per week -Labs today       Relevant Medications   rosuvastatin  (CRESTOR ) 5 MG tablet   Other Relevant Orders   Lipid panel   Essential hypertension - Primary (Chronic)   Blood pressure is at goal for age and co-morbidities.   Recommendations: continue lisinopril -hctz 10-12.5 mg daily - BP goal <130/80 - monitor and log blood pressures at home - check around the same time each day in a relaxed setting - Limit salt to <2000 mg/day - Follow DASH eating plan (heart healthy diet) - limit alcohol to 2 standard drinks per day for men and 1 per day for women - avoid tobacco products - get at least 2 hours of regular aerobic exercise weekly Patient aware of signs/symptoms requiring further/urgent evaluation. Labs ordered       Relevant Medications   lisinopril -hydrochlorothiazide  (ZESTORETIC ) 10-12.5 MG tablet   rosuvastatin  (CRESTOR ) 5 MG tablet   Other Relevant Orders   CBC with  Differential/Platelet   Comprehensive metabolic panel with GFR   TSH        Follow-up: Return in about 4 months (around 01/23/2025) for chronic disease management.   Waddell FURY Almarie, DNP, FNP-C  I,Emily Lagle,acting as a  scribe for Waddell KATHEE Mon, NP.,have documented all relevant documentation on the behalf of Waddell KATHEE Mon, NP.  I, Waddell KATHEE Mon, NP, have reviewed all documentation for this visit. The documentation on 09/24/2024 for the exam, diagnosis, procedures, and orders are all accurate and complete.

## 2024-09-27 ENCOUNTER — Ambulatory Visit: Payer: Self-pay | Admitting: Family Medicine

## 2024-09-27 DIAGNOSIS — E785 Hyperlipidemia, unspecified: Secondary | ICD-10-CM

## 2024-09-27 MED ORDER — ROSUVASTATIN CALCIUM 10 MG PO TABS: 10.0000 mg | ORAL_TABLET | Freq: Every day | ORAL | 1 refills | Status: AC

## 2024-11-10 ENCOUNTER — Ambulatory Visit: Payer: Self-pay

## 2024-11-10 ENCOUNTER — Ambulatory Visit (INDEPENDENT_AMBULATORY_CARE_PROVIDER_SITE_OTHER): Admitting: Student

## 2024-11-10 ENCOUNTER — Encounter: Payer: Self-pay | Admitting: Student

## 2024-11-10 VITALS — BP 140/82 | HR 78 | Resp 16 | Ht 61.5 in | Wt 164.0 lb

## 2024-11-10 DIAGNOSIS — M545 Low back pain, unspecified: Secondary | ICD-10-CM | POA: Diagnosis not present

## 2024-11-10 DIAGNOSIS — R350 Frequency of micturition: Secondary | ICD-10-CM | POA: Diagnosis not present

## 2024-11-10 DIAGNOSIS — G8929 Other chronic pain: Secondary | ICD-10-CM | POA: Diagnosis not present

## 2024-11-10 DIAGNOSIS — J3489 Other specified disorders of nose and nasal sinuses: Secondary | ICD-10-CM | POA: Diagnosis not present

## 2024-11-10 LAB — POCT URINALYSIS DIPSTICK
Bilirubin, UA: NEGATIVE
Blood, UA: NEGATIVE
Glucose, UA: NEGATIVE
Ketones, UA: NEGATIVE
Leukocytes, UA: NEGATIVE
Nitrite, UA: NEGATIVE
Protein, UA: NEGATIVE
Spec Grav, UA: <=1.005 — AB
Urobilinogen, UA: 0.2 U/dL
pH, UA: 5

## 2024-11-10 MED ORDER — FLUTICASONE PROPIONATE 50 MCG/ACT NA SUSP: 2.0000 | Freq: Every day | NASAL | 6 refills | Status: AC

## 2024-11-10 NOTE — Progress Notes (Signed)
 Chief Complaint  Patient presents with   Dysuria    Patient has been having lower back pain. Pain in bladder if holding urine for a long amount of time   Throat Issue    Patient has a hanging uvula. Throat drainage, no pain but constant cough to relieve the mucus    [name] Proffer here for URI complaints.   Patient presents with a 55-month history of increased urinary frequency and left-sided flank/lower back pain. She reports intermittent malodorous urine, particularly when she does not maintain adequate hydration. Denies dysuria, blood in urine, flank pain.  She also reports a persistent sore throat for approximately 4 months. Denies pelvic pain, fever. Denies sick contacts.  C/o eft-sided back pain for about four months.There is no tingling or numbness. Denies trauma, Denies falls.  Patient denies fever, chills, SOB, CP, palpitations, dyspnea, edema, HA, vision changes, N/V/D, abdominal pain, rash, weight changes, and recent illness or hospitalizations.   Past Medical History:  Diagnosis Date   Allergy 1960   Arthritis    Cancer (HCC)    Basal cell carcinoma of the nose.   Cataract    Gastroparesis    GERD (gastroesophageal reflux disease)    Hyperlipidemia    Hypertension    Osteopenia     Objective BP (!) 140/82 (BP Location: Left Arm, Patient Position: Sitting, Cuff Size: Normal)   Pulse 78   Resp 16   Ht 5' 1.5 (1.562 m)   Wt 164 lb (74.4 kg)   SpO2 98%   BMI 30.49 kg/m  General: Awake, alert, appears stated age HEENT: AT, [place], ears patent b/l and TM's neg, nares patent w/o discharge, pharynx pink and without exudates, MMM Neck: No masses or asymmetry Heart: RRR Lungs: CTAB, no accessory muscle use MSK: Full ROM,  TWP left paraspinal mucle otherwise WNL Psych: Age appropriate judgment and insight, normal mood and affect  Urine frequency - Plan: Urine Culture, POCT Urinalysis Dipstick, Ambulatory referral to Urogynecology  Chronic low back pain without  sciatica, unspecified back pain laterality  Rhinorrhea - Plan: fluticasone  (FLONASE ) 50 MCG/ACT nasal spray   Urinary frequency Possible pelvic floor weakness  - Urine culture pending - Referred to urogynecologist for evaluation   Rhinorrhea Chronic nasal drainage with postnasal drip, likely due to allergies. - Recommended daily use of Flonase  nasal spray. - follow-up  if symptoms persist or worsen.  Chronic back pain Encouraged moist heat and gentle stretching as tolerated. May use OTC Tylenol or NSAIDs as needed. Report if symptoms worsen or seek immediate care   Crystal LITTIE Jolly, DNP, AGNP-C 11/10/24 2:33 PM

## 2024-11-10 NOTE — Telephone Encounter (Signed)
 FYI Only or Action Required?: FYI only for provider: appointment scheduled on 11/10/24.  Patient was last seen in primary care on 09/24/2024 by Almarie Waddell NOVAK, NP.  Called Nurse Triage reporting Dysuria.  Symptoms began several months ago.  Interventions attempted: Nothing.  Symptoms are: gradually worsening.  Triage Disposition: See HCP Within 4 Hours (Or PCP Triage)  Patient/caregiver understands and will follow disposition?: Yes Reason for Disposition  Side (flank) or lower back pain present  Answer Assessment - Initial Assessment Questions Pt reports 4 month hx of increased urinary frequency and left sided flank pain, recent hx of malodorous urine when she does not drink adequate water. Denies hematuria. Believes she had a temp of 99.6-100 at onset, resolved. Also reports sore throat x 4 months.   1. SYMPTOM: What's the main symptom you're concerned about? (e.g., frequency, incontinence)     Frequent urination, malodorous urine, flank pain 2. ONSET:      4 months 3. PAIN: Is there any pain? If Yes, ask: How bad is it? (Scale: 1-10; mild, moderate, severe)     Varies, typically 6/10 4. CAUSE: What do you think is causing the symptoms?     Unknown 5. OTHER SYMPTOMS: Do you have any other symptoms? (e.g., blood in urine, fever, flank pain, pain with urination)     Flank pain, mild burning  Protocols used: Urinary Symptoms-A-AH  Message from [place] F sent at 11/10/2024  9:23 AM EST  Summary: Possible kidney infection, throat pain as well   Reason for Triage: Patient thinks she may have a kidney infection.  Symptom: -Back pain (for months, but she states she has been ignoring it) -Urine has a foul odor  Additionally-she has throat pain, makes it challenging to swallow due at times due to low hanging uvula which also causes her to have frequent mucous.

## 2024-11-11 LAB — URINE CULTURE
MICRO NUMBER:: 17496811
Result:: NO GROWTH
SPECIMEN QUALITY:: ADEQUATE

## 2025-01-31 ENCOUNTER — Ambulatory Visit

## 2025-02-03 ENCOUNTER — Ambulatory Visit

## 2025-02-10 ENCOUNTER — Ambulatory Visit: Admitting: Obstetrics and Gynecology
